# Patient Record
Sex: Male | Born: 1955
Health system: Southern US, Community
[De-identification: ages and names within clinical notes are randomized; demographics above are authoritative.]

## PROBLEM LIST (undated history)

## (undated) DIAGNOSIS — Z978 Presence of other specified devices: Secondary | ICD-10-CM

## (undated) DIAGNOSIS — I499 Cardiac arrhythmia, unspecified: Secondary | ICD-10-CM

## (undated) DIAGNOSIS — E785 Hyperlipidemia, unspecified: Secondary | ICD-10-CM

## (undated) DIAGNOSIS — G4733 Obstructive sleep apnea (adult) (pediatric): Secondary | ICD-10-CM

## (undated) DIAGNOSIS — F419 Anxiety disorder, unspecified: Secondary | ICD-10-CM

## (undated) DIAGNOSIS — Z973 Presence of spectacles and contact lenses: Secondary | ICD-10-CM

## (undated) DIAGNOSIS — I498 Other specified cardiac arrhythmias: Secondary | ICD-10-CM

## (undated) DIAGNOSIS — K5730 Diverticulosis of large intestine without perforation or abscess without bleeding: Secondary | ICD-10-CM

## (undated) DIAGNOSIS — I493 Ventricular premature depolarization: Secondary | ICD-10-CM

## (undated) DIAGNOSIS — H43393 Other vitreous opacities, bilateral: Secondary | ICD-10-CM

## (undated) DIAGNOSIS — N138 Other obstructive and reflux uropathy: Secondary | ICD-10-CM

## (undated) DIAGNOSIS — M479 Spondylosis, unspecified: Secondary | ICD-10-CM

## (undated) DIAGNOSIS — R7303 Prediabetes: Secondary | ICD-10-CM

## (undated) DIAGNOSIS — K5790 Diverticulosis of intestine, part unspecified, without perforation or abscess without bleeding: Secondary | ICD-10-CM

## (undated) DIAGNOSIS — F411 Generalized anxiety disorder: Secondary | ICD-10-CM

## (undated) DIAGNOSIS — Z87438 Personal history of other diseases of male genital organs: Secondary | ICD-10-CM

## (undated) DIAGNOSIS — R000 Tachycardia, unspecified: Secondary | ICD-10-CM

## (undated) DIAGNOSIS — R002 Palpitations: Secondary | ICD-10-CM

## (undated) DIAGNOSIS — G4730 Sleep apnea, unspecified: Secondary | ICD-10-CM

## (undated) DIAGNOSIS — R339 Retention of urine, unspecified: Secondary | ICD-10-CM

## (undated) DIAGNOSIS — Z720 Tobacco use: Secondary | ICD-10-CM

## (undated) DIAGNOSIS — I10 Essential (primary) hypertension: Secondary | ICD-10-CM

## (undated) DIAGNOSIS — R9430 Abnormal result of cardiovascular function study, unspecified: Secondary | ICD-10-CM

## (undated) DIAGNOSIS — R5383 Other fatigue: Secondary | ICD-10-CM

## (undated) DIAGNOSIS — E663 Overweight: Secondary | ICD-10-CM

## (undated) DIAGNOSIS — M1A00X0 Idiopathic chronic gout, unspecified site, without tophus (tophi): Secondary | ICD-10-CM

## (undated) DIAGNOSIS — N401 Enlarged prostate with lower urinary tract symptoms: Secondary | ICD-10-CM

## (undated) HISTORY — DX: Hyperlipidemia, unspecified: E78.5

## (undated) HISTORY — DX: Essential (primary) hypertension: I10

## (undated) HISTORY — DX: Tachycardia, unspecified: R00.0

## (undated) HISTORY — DX: Tobacco use: Z72.0

## (undated) HISTORY — PX: SHOULDER SURGERY: SHX246

## (undated) HISTORY — DX: Diverticulosis of intestine, part unspecified, without perforation or abscess without bleeding: K57.90

## (undated) HISTORY — PX: WISDOM TOOTH EXTRACTION: SHX21

## (undated) HISTORY — DX: Abnormal result of cardiovascular function study, unspecified: R94.30

## (undated) HISTORY — DX: Sleep apnea, unspecified: G47.30

## (undated) HISTORY — DX: Anxiety disorder, unspecified: F41.9

## (undated) HISTORY — DX: Palpitations: R00.2

## (undated) HISTORY — DX: Overweight: E66.3

## (undated) HISTORY — DX: Other fatigue: R53.83

---
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
## 2001-07-12 ENCOUNTER — Emergency Department (HOSPITAL_COMMUNITY): Admission: EM | Admit: 2001-07-12 | Discharge: 2001-07-12 | Payer: Self-pay | Admitting: *Deleted

## 2001-07-17 ENCOUNTER — Ambulatory Visit (HOSPITAL_COMMUNITY): Admission: RE | Admit: 2001-07-17 | Discharge: 2001-07-17 | Payer: Self-pay | Admitting: Family Medicine

## 2001-07-17 ENCOUNTER — Encounter: Payer: Self-pay | Admitting: Family Medicine

## 2001-11-10 ENCOUNTER — Encounter: Payer: Self-pay | Admitting: Internal Medicine

## 2001-11-10 ENCOUNTER — Ambulatory Visit (HOSPITAL_COMMUNITY): Admission: RE | Admit: 2001-11-10 | Discharge: 2001-11-10 | Payer: Self-pay | Admitting: Internal Medicine

## 2002-02-04 ENCOUNTER — Emergency Department (HOSPITAL_COMMUNITY): Admission: EM | Admit: 2002-02-04 | Discharge: 2002-02-04 | Payer: Self-pay | Admitting: Emergency Medicine

## 2002-05-02 ENCOUNTER — Emergency Department (HOSPITAL_COMMUNITY): Admission: EM | Admit: 2002-05-02 | Discharge: 2002-05-02 | Payer: Self-pay | Admitting: *Deleted

## 2002-05-02 ENCOUNTER — Encounter: Payer: Self-pay | Admitting: *Deleted

## 2002-11-25 ENCOUNTER — Encounter (HOSPITAL_COMMUNITY): Admission: RE | Admit: 2002-11-25 | Discharge: 2002-12-25 | Payer: Self-pay | Admitting: Rheumatology

## 2002-11-25 ENCOUNTER — Encounter: Payer: Self-pay | Admitting: Rheumatology

## 2003-01-27 ENCOUNTER — Encounter (HOSPITAL_COMMUNITY): Admission: RE | Admit: 2003-01-27 | Discharge: 2003-02-26 | Payer: Self-pay | Admitting: Rheumatology

## 2003-05-26 ENCOUNTER — Encounter (HOSPITAL_COMMUNITY): Admission: RE | Admit: 2003-05-26 | Discharge: 2003-06-09 | Payer: Self-pay | Admitting: Rheumatology

## 2003-07-27 ENCOUNTER — Ambulatory Visit (HOSPITAL_COMMUNITY): Admission: RE | Admit: 2003-07-27 | Discharge: 2003-07-27 | Payer: Self-pay | Admitting: Family Medicine

## 2003-10-24 ENCOUNTER — Ambulatory Visit (HOSPITAL_COMMUNITY): Admission: RE | Admit: 2003-10-24 | Discharge: 2003-10-24 | Payer: Self-pay | Admitting: Family Medicine

## 2004-10-22 ENCOUNTER — Emergency Department (HOSPITAL_COMMUNITY): Admission: EM | Admit: 2004-10-22 | Discharge: 2004-10-22 | Payer: Self-pay | Admitting: Emergency Medicine

## 2004-11-05 ENCOUNTER — Ambulatory Visit: Payer: Self-pay | Admitting: *Deleted

## 2004-11-19 ENCOUNTER — Encounter (HOSPITAL_COMMUNITY): Admission: RE | Admit: 2004-11-19 | Discharge: 2004-12-19 | Payer: Self-pay | Admitting: *Deleted

## 2004-11-19 ENCOUNTER — Ambulatory Visit: Payer: Self-pay | Admitting: *Deleted

## 2004-11-28 ENCOUNTER — Ambulatory Visit: Payer: Self-pay | Admitting: *Deleted

## 2005-09-10 ENCOUNTER — Emergency Department (HOSPITAL_COMMUNITY): Admission: EM | Admit: 2005-09-10 | Discharge: 2005-09-10 | Payer: Self-pay | Admitting: Emergency Medicine

## 2005-09-11 ENCOUNTER — Ambulatory Visit: Payer: Self-pay | Admitting: *Deleted

## 2005-10-02 ENCOUNTER — Ambulatory Visit: Payer: Self-pay | Admitting: *Deleted

## 2005-10-04 ENCOUNTER — Ambulatory Visit: Payer: Self-pay | Admitting: Pulmonary Disease

## 2006-01-23 ENCOUNTER — Ambulatory Visit: Payer: Self-pay | Admitting: Cardiology

## 2006-09-23 ENCOUNTER — Ambulatory Visit (HOSPITAL_COMMUNITY): Payer: Self-pay | Admitting: Psychiatry

## 2006-10-01 ENCOUNTER — Ambulatory Visit (HOSPITAL_COMMUNITY): Payer: Self-pay | Admitting: Psychiatry

## 2006-10-09 ENCOUNTER — Ambulatory Visit (HOSPITAL_COMMUNITY): Payer: Self-pay | Admitting: Psychiatry

## 2006-10-15 ENCOUNTER — Ambulatory Visit (HOSPITAL_COMMUNITY): Payer: Self-pay | Admitting: Psychiatry

## 2006-10-30 ENCOUNTER — Ambulatory Visit: Payer: Self-pay | Admitting: Cardiology

## 2006-11-20 ENCOUNTER — Ambulatory Visit (HOSPITAL_COMMUNITY): Payer: Self-pay | Admitting: Psychiatry

## 2006-12-10 ENCOUNTER — Emergency Department (HOSPITAL_COMMUNITY): Admission: EM | Admit: 2006-12-10 | Discharge: 2006-12-10 | Payer: Self-pay | Admitting: Emergency Medicine

## 2006-12-16 ENCOUNTER — Ambulatory Visit (HOSPITAL_COMMUNITY): Payer: Self-pay | Admitting: Psychiatry

## 2006-12-17 ENCOUNTER — Ambulatory Visit: Payer: Self-pay | Admitting: Internal Medicine

## 2006-12-19 ENCOUNTER — Ambulatory Visit: Payer: Self-pay | Admitting: Internal Medicine

## 2006-12-26 ENCOUNTER — Ambulatory Visit: Payer: Self-pay

## 2007-02-10 ENCOUNTER — Ambulatory Visit: Payer: Self-pay | Admitting: Internal Medicine

## 2007-03-30 ENCOUNTER — Ambulatory Visit: Payer: Self-pay | Admitting: Internal Medicine

## 2007-04-09 ENCOUNTER — Ambulatory Visit: Payer: Self-pay | Admitting: Cardiology

## 2007-04-09 LAB — CONVERTED CEMR LAB
CO2: 29 meq/L (ref 19–32)
GFR calc Af Amer: 103 mL/min
GFR calc non Af Amer: 85 mL/min
Glucose, Bld: 93 mg/dL (ref 70–99)
Potassium: 3.6 meq/L (ref 3.5–5.1)
Sodium: 138 meq/L (ref 135–145)

## 2007-12-29 ENCOUNTER — Emergency Department (HOSPITAL_COMMUNITY): Admission: EM | Admit: 2007-12-29 | Discharge: 2007-12-29 | Payer: Self-pay | Admitting: Emergency Medicine

## 2007-12-29 ENCOUNTER — Encounter: Payer: Self-pay | Admitting: Cardiology

## 2008-01-16 ENCOUNTER — Emergency Department (HOSPITAL_COMMUNITY): Admission: EM | Admit: 2008-01-16 | Discharge: 2008-01-16 | Payer: Self-pay | Admitting: Emergency Medicine

## 2008-01-26 ENCOUNTER — Ambulatory Visit: Payer: Self-pay | Admitting: Cardiology

## 2009-02-17 ENCOUNTER — Encounter: Payer: Self-pay | Admitting: Cardiology

## 2009-06-05 ENCOUNTER — Ambulatory Visit: Payer: Self-pay | Admitting: Cardiology

## 2009-06-14 ENCOUNTER — Telehealth (INDEPENDENT_AMBULATORY_CARE_PROVIDER_SITE_OTHER): Payer: Self-pay | Admitting: Physician Assistant

## 2009-06-15 ENCOUNTER — Telehealth: Payer: Self-pay | Admitting: Cardiology

## 2009-08-30 ENCOUNTER — Encounter: Payer: Self-pay | Admitting: Pulmonary Disease

## 2009-09-01 ENCOUNTER — Encounter: Payer: Self-pay | Admitting: Pulmonary Disease

## 2010-04-27 ENCOUNTER — Telehealth: Payer: Self-pay | Admitting: Cardiology

## 2010-09-29 ENCOUNTER — Encounter: Payer: Self-pay | Admitting: Family Medicine
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
## 2011-01-22 NOTE — Assessment & Plan Note (Signed)
[facility]                            CARDIOLOGY OFFICE NOTE   Craig [name], Craig [name]                       MRN:          [id number]  DATE:12/17/2006                            DOB:          February 20, 1956    CARDIOLOGIST:  [name], MD, [facility]   PRIMARY CARE PHYSICIANS:  Madelin Rear. Sherwood Gambler, MD  Patrica Duel, M.D.   HISTORY OF PRESENT ILLNESS:  Craig [name] is a 55 year old male patient  followed by Dr. Myrtis Ser with a history of hypertension as well as  palpitations and chest pain. He had an adenosine Myoview in 2006,  revealing an EF of 63% and no ischemia. He has worn some monitors in the  past. He also has a history of significant anxiety and depression and  Dr. Myrtis Ser has felt that this plays a major role in his situation. He  presents to the office today for followup. He went to the [facility]  Emergency Room on April 2, with tachycardia. He had been in the shower  and when he got out he felt somewhat flushed. Prior to this he had eaten  dinner and noted that he had some abdominal discomfort. He checked his  pulse and noted it was elevated and he decided to go to the emergency  room. In the ER, his heart rate was in the 130s. He did not feel any  palpitations, but just did notice his pulse running fast. He denied any  chest pain or shortness of breath. In the hospital bed, he noted to the  ER physician that he usually takes Toprol for this and his heart rate  slows down and he feels better and goes home. Within a couple of minutes  of receiving Toprol, he developed a brady arrhythmia with a significant  pause of 3 to 3.5 seconds. The patient notes that he had a syncopal  episode. When he came to, several staff members were around him trying  to revive him. He was somewhat frightened by this and is still concerned  about the decreased heart rate. He actually obtained the strips and  brought them to me for review today. He has not had any further  episodes  of syncope or near-syncope. He decreased his atenolol dose himself to  once daily dosing. He has felt a little bit fatigued since the day he  went to the emergency room, but otherwise feels better. Denies any chest  pain or shortness of breath. Denies any orthopnea or paroxysmal  nocturnal dyspnea. Denies any lower extremity edema. He does note that  the day he went to the ER he had several episodes of diarrhea. This  resolved within 24 hours. He denies nausea or vomiting.   CURRENT MEDICATIONS:  1. Xanax 0.5 mg four times a day.  2. Atenolol 25 mg daily.  3. Allopurinol 100 mg daily.  4. Amitriptyline 20 mg daily.  5. Colchicine p.r.n.   ALLERGIES:  No known drug allergies.   PHYSICAL EXAMINATION:  He is a well-nourished, well-developed male in no  acute distress. Blood pressure is 117/90, pulse 66, weight 249  pounds.  HEAD: Normocephalic, atraumatic.  EYES: PERRLA, sclerae are clear.  NECK: Without JVD.  CARDIAC: Normal S1, S2, regular rate and rhythm without murmur, clicks,  rubs or gallops.  LUNGS:  Are clear to auscultation bilaterally without wheezing, rhonchi  or rales.  ABDOMEN: Soft and nontender.  EXTREMITIES: Without edema. Calves are soft and nontender.  SKIN: Is warm and dry.  Carotids are without bruits bilaterally.  NEUROLOGIC: He is alert and oriented x3. Cranial nerves II-XII are  grossly intact.   Electrocardiogram reveals sinus rhythm with a heart rate of 66, no acute  changes. Electrocardiogram from [facility] on April 2, reveals  sinus tachycardia with a heart rate of 136. His P-wave morphology is  somewhat unusual in V1. I reviewed this with Dr. Gala Romney. We also  reviewed the strips. He had a significant pause. This measures out to be  about 3 seconds on one occasion and 3-1/2 seconds on another occasion.   IMPRESSION:  1. Syncope in the setting of significant bradycardia and 3 to 3.5      second pause.  2. Tachycardia.      a.      History of palpitations, controlled on beta-blocker therapy.  3. History of hypertension, controlled.  4. History of chest pain.      a.     Adenosine Myoview in 2006, with an ejection fraction of 63%,       no ischemia.  5. Questionable sleep apnea.  6. History of chronic headache.  7. History of chronic fatigue.  8. History of gout.  9. Anxiety/depression.   PLAN:  1. As noted above, I reviewed the electrocardiogram and strips with      Dr. Gala Romney. We are somewhat concerned about the pause that he      had during admission to [facility] Emergency Room. Upon      review of his records, he did have low-normal potassium at 3.5, BUN      of 14, creatinine of 0.87, magnesium of 1.6. Point of care markers      negative x2. We question whether or not he may have an atrial      tachycardia. I discussed this over the telephone with Dr. Graciela Husbands. He      notes that if he does have an atrial tachycardia with post      termination pauses that he would likely benefit from ablation      therapy. We discussed recommendations regarding driving. In the      short-term, we think it is best that the patient refrain from      driving until further evaluations are accomplished. We will have      the patient set up with a CardioNet event monitor as well as      referral to Dr. Graciela Husbands. Dr. Graciela Husbands will be able to review the strips      from the patient's visit to the emergency room later this week and      see him in the office for further evaluation. I discussed this with      the patient and he is willing to      proceed with the evaluation as stated and agrees to refrain from      driving at this point in time.      Tereso Newcomer, PA-C  Electronically Signed      Bevelyn Buckles. Bensimhon, MD  Electronically Signed   SW/MedQ  DD: 12/17/2006  DT: 12/17/2006  Job #: [id number]   cc:   Madelin Rear. Sherwood Gambler, MD  Patrica Duel, M.D.
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
## 2011-01-22 NOTE — Assessment & Plan Note (Signed)
[facility]                         ELECTROPHYSIOLOGY OFFICE NOTE   NAME:[name], [name]                       MRN:          [id number]  DATE:03/30/2007                            DOB:          [birth year]    Craig [name] is seen.  He has more palpitations.  He has a history of  hypertension and chest pain, is status post adenosine Myoview scanning  in 2006, demonstrating normal left ventricular function, no ischemia.  He had a syncopal episode in April, where he was seen at [facility]  emergency room, where his heart rate was noted to be fast.  He was given  Toprol and then developed a significant pause.  His CardioNet strips  were obtained, and I do not have them, although I made as a comment from  a note in February 10, 2007 from me that he had a Holter monitor  demonstrating RR lengthening, then the pause.   He has had recent problems with gout.  He has been taking his colchicine  for this.  Typically, this reveals a resolution, but in the wake of this  he has had now more problems with palpitations.   He also complains of right ankle swelling with this.   Palpitations have not been more characterized.  There was an event  recorder obtained, and I need to track down the results of that.  I made  two notes from yesterday, and that comments regarding atrial tachycardia  as well as PVCs.   He has tried beta blockers for this, and there is some concern that it  may aggravate his gout.   His other medications include Xanax and Amitriptyline.   EXAMINATION:  VITAL SIGNS:  His blood pressure is 120/72, his pulse is  75.  LUNGS:  Clear.  HEART:  Sounds were regular.  EXTREMITIES:  Without edema.   His electrocardiogram was normal.   IMPRESSION:  1. Palpitations, question mechanism.  2. History of syncope in the setting of pain.  3. Hypertension.  4. Anxiety/depression.  5. Gout.   Craig [name] is not doing that well.  We are going to try Verapamil  and  diltiazem in random order, to see if any of those will help his  palpitations.  I need to track down his monitor, to find out where it  was obtained, so we can review those data.     Duke Salvia, MD, [facility]  Electronically Signed    SCK/MedQ  DD: 03/31/2007  DT: 03/31/2007  Job #: [id number]

## 2011-01-22 NOTE — Assessment & Plan Note (Signed)
[facility]                          [place] CARDIOLOGY OFFICE NOTE   NAME:Cambre, PRITESH SOBECKI                       MRN:          [id number]  DATE:01/26/2008                            DOB:          [birth year]    HISTORY:  Mr. Krupka is here for cardiology followup.  He is actually  doing well.  He has had some palpitations in the past.  He says that  recently he decided to follow a better diet and a fit better life, and  he is actually doing well.  He has had some urinary symptoms.  He has  not had any significant chest pain.  He saw Dr. Larey Dresser.  There  was question of Uroxatral use.  He thought this might make his  palpitations worse.  Therefore, he is asking me if he can take the  medication.   ALLERGIES:  He does not tolerate NIACIN.   MEDICATIONS:  1. Xanax.  2. Elavil.  3. Atenolol.  4. Doxycycline.   OTHER MEDICAL PROBLEMS:  See the list below.   REVIEW OF SYSTEMS:  Negative.   PHYSICAL EXAMINATION:  VITAL SIGNS:  Blood pressure is 126/80.  Heart  rate is 71.  GENERAL:  The patient is oriented to person, time and place.  Affect is  normal.  HEENT:  Reveals no xanthelasma.  There is normal extraocular  motion.  NECK:  There are no carotid bruits.  There is no jugular venous  distention.  LUNGS:  Clear.  Respiratory effort is not labored.  CARDIAC:  Reveals S1-S2.  There are no clicks or significant murmurs.  ABDOMEN:  Soft.  EXTREMITIES:  He has no peripheral edema.   DIAGNOSTICS:  EKG reveals normal sinus rhythm.   PROBLEMS:  1. History of hypertension.  2. History of anxiety and depression, stable today.  3. History of gout.  4. History of chronic fatigue.  5. History of chronic headaches.  6. History of sleep apnea.  This has been evaluated by Dr. Craige Cotta.  7. Chest pain.  He had a Myoview in 2006 with no ischemia and normal      ejection fraction.  8. History of palpitations, stable.  9. Urinary symptoms followed by Dr.  Vonita Moss.   DISCUSSION:  Mr. Kreiser is stable.  I have not changed any of his  medications.  I will see him in 6 months.  I told him that I was very  comfortable that it was safe for him to try Uroxatral.     [name], MD, [facility]  Electronically Signed    JDK/MedQ  DD: 01/26/2008  DT: 01/26/2008  Job #: [id number]

## 2011-01-22 NOTE — Assessment & Plan Note (Signed)
[facility]                                 ON-CALL NOTE   BONNER, LARUE                         MRN:          [id number]  DATE:12/26/2007                            DOB:          [birth year]    PRIMARY CARDIOLOGIST:  [name], MD, [facility]   Received a page through the answering service twice for a Mr. Halford Decamp.  Note, both phone conversations were approximately 20 minutes in  length.  Mr. Schramm called twice for problems with blood pressure and  pulse.  I called him back promptly at [phone]; he stated he was a  patient of Dr. Myrtis Ser and that his heart has been racing.  He has a blood  pressure monitor at home that checks his heart rate also.  When I asked  him what his racing heart rate was, he stated it was 90; his normal  pulse was around 60.  He states he had just been sitting watching TV,  when his heart rate suddenly was pounding.  This was when he checked his  blood pressure as 130/78 with a heart rate of 90.  He states his pulse  was not irregular.  He tried to palpate his pulse.  He denied any  associated symptoms, including lightheadedness, dizziness, presyncope,  syncope or chest discomfort.  He states he had been at home and had not  been doing anything strenuous.  He had not consumed any caffeine or over-  the-counter stimulants.  He was very agitated that his heart rate was  90, when it was normally 60.  I spoke with him at length concerning the  elevated pulse.  I asked him about his medications.  He stated he was on  atenolol, but had stopped it for a length of time; but had been back on  it for at least a week.  I asked him to take an additional 12.5 mg of  his atenolol.  He stated he did not want to do that because in the past  it has made his heart rate drop too low.  I reviewed the choices with  him; he could either try another half of the atenolol or he could come  to the emergency room (preferably [facility], although  he states he  lives in [place]) and get evaluated.  He stated he was going to wait  a few minutes and just relax and see if his heart rate slowed down.  He  would call me back.   He then called me back approximately an hour later, to state that his  heart rate was back down to normal.  He did not take any Xanax or any of  his additional atenolol.  Once again, he went through the same scenario  with me, that his heart rate was 90 and that is too fast.  He was very  upset about this.  I explained to him that I could not find any cause  for it to be cardiac related; possibly it could be anxiety.  But, I  would let  Dr. Myrtis Ser know, and the patient could follow up with  Dr. Myrtis Ser for further recommendation; possibly a 21-day event monitor.  Mr. Mcclaine would need to present to the emergency room if he had any  further problems.      Dorian Pod, ACNP  Electronically Signed      Madolyn Frieze. Jens Som, MD, [facility]  Electronically Signed   MB/MedQ  DD: 12/28/2007  DT: 12/28/2007  Job #: [id number]   cc:   [name], MD, [facility]

## 2011-01-22 NOTE — Letter (Signed)
December 19, 2006    Craig Newcomer, PA-C  [address]  [address]  [place], [place] [address]   RE:  Craig, [name]  MRN:  [id number]  /  DOB:  [birth year]   Dear Craig [name]:   It was a pleasure to see Craig [name] at your request today.  He has a  longstanding history of debilitating neuropsychiatric disease with  chronic fatigue, depression, and anxiety.   He has longstanding history of tachy palpitations that take him to the  emergency room 3 or 4 times a year, at which time he is treated with  Toprol with resolution.  He is on chronic Atenolol, higher doses of  which had been precluded by concomitant gout.   The other day he was having a relatively normal evening.  He suddenly  felt not quite so well and his fatigue seemed to be worse.  He hung out  on the couch for a couple of hours, then got up and went to take a  shower.  Prior to this he felt a little bit chilly, and had a little bit  of diarrhea.  Prior to the shower he noticed that his face felt warm,  after the shower his face stayed warm.  He sat on the couch for about an  hour after the shower, felt in his chest a little lightness which  turns out to correlate in his mind with his racing heart.  He took his  pulse, found it to be racing, took an Atenolol, repeated one about a  half an hour later.  There was a little bit of concomitant nausea that  this point, he went to the emergency room.  Upon arrival in the  emergency room his heart rate was about 140 or so.  He was given a dose  of Toprol within a minute or so.  His heart rate slowed very suddenly.  There was loss of consciousness and immediate restoration of heart rate.  As best as I can tell this all occurred in the supine position.   He does use caffeine, he does not use stimulants. He does have some  orthostatic intolerance, but no shower intolerance.   He does not use cigarettes, alcohol, or recreational drugs.  He does  have some remote alcohol use.   PAST MEDICAL  HISTORY:  Notable primarily as above.  Also is notable for  obesity.   CURRENT MEDICATIONS:  1. Xanax 0.5.  2. Amitriptyline.  3. Allopurinol 100.  4. Atenolol 25 b.i.d.   HE HAS NO KNOWN DRUG ALLERGIES.   SOCIAL HISTORY:  He is disabled, he lives with his wife.   REVIEW OF SYSTEMS:  Broadly negative apart from what was described above  and is further outlined on the intake sheet from [facility] on April 2  and is no different.   EXAMINATION:  He is a middle-aged Caucasian male appearing his stated  age of 89.  His blood pressure is 121/81 with a pulse of 64.  His blood  pressures were essentially unchanged with standing, although his pulse  went from 64 to 60 with sitting and 75 at standing associated with some  slight dizziness.  HEENT:  Demonstrated no drifts of xanthoma.  NECK:  Veins were flat, the carotids were full bilaterally without  bruits.  BACK:  Without kyphosis, scoliosis.  LUNGS:  Clear.  HEART:  Sounds were regular.  EXTREMITIES:  Without edema.   Electrocardiogram dated on April 9 demonstrated sinus rhythm at  66 but  there was a 0.16/0.07/0.39.   Electrocardiogram from this tachycardia was notable for a P wave  morphology that was slightly different.  However, I found an  electrocardiogram from May of __________ that had a P wave morphology  and a slow heart rate, similar to the P wave morphology of the  tachycardia.   The review of the Holter demonstrates gradually RR lengthening and then  abrupt lengthening just moments before it slowed even further,  generating the pause and the syncope.  His longest pause was about 3  seconds and then there was another pause that may have been a little  longer than that.   IMPRESSION:  1. Tachycardia, probably neurally mediated, need to exclude an atrial      tachycardia.  2. Bradycardia, likely neurally mediated, need to rule out post      termination pause.  3. Chronic fatigue, anxiety/depression.   Craig [name]  has significant concerns about the spells, as is appropriate.  I am not quite sure what the trigger of it was, but I do think it was  neurally mediated.  It may have been that it was triggered by the  tachycardia in part.  Alternatively it might have been a post  termination pause related to the ending of a primary electrical issue.   To that end we will try to use an event recorder to catch the  transitions to help [place] clarify.  I will plan to sit down with him in  about 4 week's time to review this.  In the interim he is to continue  his current medications.   I have asked that when he meets with Dr. Jennelle Human that he consider the use  of alternative antidepressants, potentially fluoxetine which has been  shown to have some benefit in early mediated syncope and certainly  considering discontinuation of the vagolytic amitriptyline.    Sincerely,      Craig Salvia, MD, [facility]  Electronically Signed    SCK/MedQ  DD: 12/19/2006  DT: 12/19/2006  Job #: [id number]   CC:   Craig Staggers, MD  Craig Rear. Sherwood Gambler, MD
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
## 2011-01-22 NOTE — Assessment & Plan Note (Signed)
[facility]                            CARDIOLOGY OFFICE NOTE   NAME:[name], [name]                       MRN:          [id number]  DATE:04/09/2007                            DOB:          Feb 16, 1956    See the notes in the chart from Dr. Odessa Fleming evaluation.  Dr. Graciela Husbands has  recommended either Diltiazem or verapamil as a trial.  The patient read  about these and decided that he is worried he will develop heart failure  and so he has not tried them.  He has a multitude of symptoms.  He has  cut his atenolol down to 12.5 mg daily.  He feels awful with the  medicine, and he says that he has palpitations without it.  We will  switch him to a long-acting metoprolol to see if this helps.   PAST MEDICAL HISTORY:   ALLERGIES:  None known.   MEDICATIONS:  1. Colchicine.  2. Xanax.  3. Amitriptyline.  4. Allopurinol.  5. Atenolol 12.5 daily.   OTHER MEDICAL PROBLEMS:  See my prior notes.   REVIEW OF SYSTEMS:  He has multiple complaints.   PHYSICAL EXAMINATION:  VITAL SIGNS:  Blood pressure is 103/81 with a  pulse of 80.  GENERAL:  The patient is oriented to person, time, and place.  He is  depressed.  LUNGS:  Clear.  Respiratory effort is not labored.  CARDIAC:  Reveals an S1 with an S2.  He has no clicks or significant  murmurs.  He has no peripheral edema.   PROBLEM LIST:  On my prior note and reviewed.  He continues to have some  mild palpations.  See the workup by Dr. Graciela Husbands with a question of syncope  and a question of pauses.  He will need followup with Dr. Graciela Husbands.   In the meantime, we will change his atenolol to metoprolol extended  release 12.5 mg daily.  He would like to go back on Benicar as he felt  well with this, however, his blood pressure is low.  I do not want to  add any other medicines.     [name], MD, [facility]  Electronically Signed    JDK/MedQ  DD: 04/09/2007  DT: 04/09/2007  Job #: [id number]

## 2011-01-25 NOTE — Assessment & Plan Note (Signed)
[facility]                            CARDIOLOGY OFFICE NOTE   NAME:Craig [name], Craig [name]                       MRN:          [id number]  DATE:10/30/2006                            DOB:          1955-12-15    Mr. Mccleary is seen for cardiac evaluation today.  He has had significant  recent chest discomfort and pain in his left neck.  We know from prior  evaluation that we have no documented coronary disease.  He has not  undergone cardiac catheterization.  The patient has been under enormous  stress.  He worries a great deal about his daughter who has respiratory  difficulty.  He also has been having periods of forgetfulness.  He has  had a longterm psychiatrist in [place] who retired and he will  have a new physician soon in that regard.  He has seen Dr. Dorethea Clan in  [place] in the past and then asked that I see him after that.   Recently he had some chest discomfort.  He had a localized discomfort in  his left neck.  When he called, he was encouraged to come to the  emergency room.  He was very hesitant about that and waited until today.  He feels much better at this time.   PAST MEDICAL HISTORY:  ALLERGIES:  NO KNOWN DRUG ALLERGIES.   MEDICATIONS:  1. Xanax 0.5 q.i.d.  2. Amitriptyline 75 mg at night.  3. Atenolol 25 b.i.d.  4. Allopurinol 100 mg daily.   OTHER MEDICAL PROBLEMS:  See the list below.   REVIEW OF SYSTEMS:  He has significant fatigue.  He is worried about  short episodes of memory loss.  As mentioned, he has enormous stress.  Otherwise his review of systems is negative.   PHYSICAL EXAMINATION:  Weight is 253 pounds. Blood pressure is 134/84  with a pulse of 85.  The patient is oriented to person, time, and place.  His affect reveals that he seems to me to be quite depressed.  He knows  that he is feeling poorly psychologically and he will be following up  with his appropriate doctors.  HEENT:  Reveals no xanthelasma.  The  patient appears to be somewhat wall-  eyed.  He focuses better with his right eye.  There are no carotid bruits.  There is no jugular venous distention.  CARDIAC:  Reveals an S1 with an S2.  There are no clicks or significant  murmurs.  The patient is significantly overweight.  ABDOMEN:  Soft.  There are no masses or bruits.  He has no peripheral  edema.   EKG reveals no significant abnormality.   PROBLEMS:  1. History of hypertension.  He was off his medicine and now back on      it and his blood pressure is stable today.  2. History of significant anxiety and depression.  I believe that this      plays a major role in his current situation, and he is bothered by      the multiple stresses that he has.  3. History of gout.  4. History of chronic fatigue.  5. Chronic headache.  6. Question of sleep apnea which has been evaluated.  There is a      complete note in the chart from Dr. Craige Cotta concerning this.  I am not      sure that the patient has followed through.  7. Chest pain.  He has had this before.  Adenosine Myoview in 2006      revealed an ejection fraction of 63% and no ischemia.  8. History of palpitations. This is stable at this time.   The patient feels much better.  I doubt that his symptoms are cardiac in  origin.  I was in the room with him speaking with him at great length  about many of his issues.  I spent the time with him specifically to try  to offer him some reassurance.  However, it is clear that his ongoing  psychiatric help will be very important.  No further cardiac workup at  this time.     [name], MD, [facility]  Electronically Signed    JDK/MedQ  DD: 10/30/2006  DT: 10/30/2006  Job #: [id number]   cc:   Patrica Duel, M.D.

## 2011-01-25 NOTE — Procedures (Signed)
   NAME:  Craig [name], Craig [name]                          ACCOUNT NO.:  [id number]   MEDICAL RECORD NO.:  [id number]                   PATIENT TYPE:  MS   LOCATION:                                       FACILITY:  [facility]   PHYSICIAN:  Fredirick Maudlin, M.D.              DATE OF BIRTH:  February 16, 1956   DATE OF PROCEDURE:  DATE OF DISCHARGE:  02/04/2002                                EKG INTERPRETATION   DATE AND TIME:  4:05  on 02/04/02  The rhythm is sinus rhythm with a rate of 101.  There are small Q waves  inferiorly which may indicate a previous inferior myocardial infarction.  There is minimal ST elevation in the lateral chest leads which may be due to  injury.  Clinical correlation is suggested.   IMPRESSION:  Abnormal electrocardiogram.                                               Fredirick Maudlin, M.D.    ELH/MEDQ  D:  04/12/2002  T:  04/17/2002  Job:  [id number]

## 2011-01-25 NOTE — Consult Note (Signed)
   NAME:  Craig [name], Craig [name] NO.:  [id number]   MEDICAL RECORD NO.:  [id number]                   PATIENT TYPE:   LOCATION:                                       FACILITY:   PHYSICIAN:  Aundra Dubin, M.D.            DATE OF BIRTH:   DATE OF CONSULTATION:  02/03/2003  DATE OF DISCHARGE:                                   CONSULTATION   CHIEF COMPLAINT:  Gout.   HISTORY OF PRESENT ILLNESS:  [name] returned on Jan 27, 2003 because of  some achiness in his foot.  He had not had any gout attacks before then.  I  had him stop the allopurinol over this 1 week and take colchicine 0.6 mg  b.i.d.  He is better at this time.  He has been on the prednisone until this  ran out a few days ago. His weight is stable and he is down 1/2 pound.  Mr.  Craig [name] needed a great deal of time to listen to a social situation as he is  having difficulty with a neighbor.   MEDICINES:  1. Xanax 1 mg h.s.  2. Elavil 25 mg h.s.  3. Allopurinol usually 300 mg daily.  4. Colchicine 0.6 mg b.i.d.   PHYSICAL EXAMINATION:  VITAL SIGNS:  Weight 226 pounds.  Blood pressure  110/70, respirations 18.  EXTREMITIES:  The hands, wrists, elbows, shoulders, knees, ankles and feet  had no active swelling and were all cool.   ASSESSMENT AND PLAN:  1. Gout.  The plan is for him to return to allopurinol 200 mg daily.  He     will take colchicine 0.6 mg b.i.d. for 2 weeks and then lower to 1 daily     for 2 weeks; and then, if he is having no twinges he can stop the     colchicine.  I also advised him that he can restart the colchicine, at     any point, that he has twinges of the gout.  He is now off of prednisone.  2. Chronic fatigue.  3.     Situational anxiety.  4. He will return in 4 months.   TIME:  40 minutes with decision making on Jan 27, 2003 and today's office  visit lasting 35 minutes.                                               Aundra Dubin, M.D.    WWT/MEDQ   D:  02/03/2003  T:  02/03/2003  Job:  [id number]   cc:   Madelin Rear. Sherwood Gambler, M.D.  [address]  [place]  [place] [address]  Fax: [phone]

## 2011-01-25 NOTE — Consult Note (Signed)
Craig [name], Craig [name]                          ACCOUNT NO.:  [id number]   MEDICAL RECORD NO.:  [id number]                   PATIENT TYPE:  OUT   LOCATION:  RAD                                  FACILITY:  [facility]   PHYSICIAN:  Aundra Dubin, M.D.            DATE OF BIRTH:  12/25/1955   DATE OF CONSULTATION:  05/26/2003  DATE OF DISCHARGE:                                   CONSULTATION   CHIEF COMPLAINT:  Gout.   HISTORY OF PRESENT ILLNESS:  Craig [name] is a 55 year old man with gout.  He  has been taking a reduced dose of allopurinol either at 150 mg a day or  skipping some days.  However, he had a gout attack in the right big toe two  weeks ago.  He used colchicine, and this is completely asymptomatic today.  He has had some occasional headaches, but this is not a consistent everyday  process.  He has had some leg cramps, but this may be have happened in April  or May.  There has been no significant diarrhea, rashes, fever, nausea.  His  weight is up 9 pounds.   MEDICATIONS:  1. Allopurinol 300 mg a day or less.  2. Colchicine 0.6 mg p.r.n.  3. Elavil 25 mg q.h.s.  4. Xanax 1 mg q.h.s.   PHYSICAL EXAMINATION:  VITAL SIGNS:  Weight 235 pounds, blood pressure  140/94, respirations 16.  GENERAL:  No distress.  SKIN:  Clear.  LUNGS:  Clear.  HEART:  Regular.  No murmur.  EXTREMITIES:  Lower extremities with no edema.  MUSCULOSKELETAL:  The hands, wrists, elbows, and shoulders have a good range  of motion.  The hands have thickening, but this is not arthritis.  The knees  are cool and flex easily to 130 degrees.  The ankles and feet were  nonswollen and nontender.  In particular, the big toes were nontender.   ASSESSMENT AND PLAN:  1. Gout.  I have emphasized to Craig [name] to stay on the allopurinol 300 mg     once a day.  He is likely not to need colchicine very often.  I believe     this is a good clear diagnosis of gout at this point.  However, this is     not crystal  proven.  We will check a uric acid and CMET today.  2. Chronic fatigue.  3. Elevated blood pressure.  I have asked him to follow up with Dr. Sherwood Gambler     within one to two weeks concerning this.   Craig [name] gout is stable if he will stay on the allopurinol.  He has some  blood pressure issues that will be followed by Dr. Sherwood Gambler.  Craig [name] is  informed by me that my clinic is being terminated.  He will follow up with  all of his medical needs at this point with Dr. Sherwood Gambler.  Aundra Dubin, M.D.    WWT/MEDQ  D:  05/26/2003  T:  05/26/2003  Job:  [id number]   cc:   Madelin Rear. Sherwood Gambler, M.D.  [address]  [place]  [place] [address]  Fax: [phone]

## 2011-01-25 NOTE — Procedures (Signed)
   NAME:  KHARY, SCHABEN                          ACCOUNT NO.:  [id number]   MEDICAL RECORD NO.:  [id number]                   PATIENT TYPE:  MS   LOCATION:                                       FACILITY:  [facility]   PHYSICIAN:  Fredirick Maudlin, M.D.              DATE OF BIRTH:  [birth year]   DATE OF PROCEDURE:  DATE OF DISCHARGE:  02/04/2002                                EKG INTERPRETATION   DATE AND TIME:  7:26 on 02/04/02   INTERPRETATION:  The rhythm is sinus rhythm with a rate of about 100.  There  is minimal ST elevation in V4, V5, and V6.  This could indicate an injury  and clinical correlation is suggested.                                                Fredirick Maudlin, M.D.    ELH/MEDQ  D:  04/12/2002  T:  04/17/2002  Job:  [id number]

## 2011-01-25 NOTE — Procedures (Signed)
NAMEMORELL, MEARS NO.:  [id number]   MEDICAL RECORD NO.:  [id number]          PATIENT TYPE:  REC   LOCATION:  RAD                           FACILITY:  [facility]   PHYSICIAN:  Vida Roller, M.D.   DATE OF BIRTH:  08-30-1956   DATE OF PROCEDURE:  DATE OF DISCHARGE:                                    STRESS TEST   ADENOSINE MYOVIEW   HISTORY:  Mr. Jaros is a 55 year old male with no known coronary artery  disease with complaints of palpitations with exertion.  His cardiac risk  factors include hypertension.   Baseline data EKG reveals a sinus rhythm at 78 beats per minute with  nonspecific ST abnormalities.  Blood pressure is 138/68.   Forty-nine milligrams of adenosine was infused over a four-minute protocol  with Myoview injected at three minutes.  The patient reported a flushed  feeling, shortness of breath and abdominal and chest tightness which  resolved in recovery.  EKG revealed no arrhythmias and no ischemic changes.   Final images and results are pending MD review.      AB/MEDQ  D:  11/19/2004  T:  11/19/2004  Job:  [id number]

## 2011-01-25 NOTE — Group Therapy Note (Signed)
   NAME:  Craig [name], Craig [name]                          ACCOUNT NO.:  [id number]   MEDICAL RECORD NO.:  [id number]                   PATIENT TYPE:  EMS   LOCATION:  ED                                   FACILITY:  [facility]   PHYSICIAN:  Fredirick Maudlin, M.D.              DATE OF BIRTH:  [birth year]   DATE OF PROCEDURE:  DATE OF DISCHARGE:  05/02/2002                                   PROGRESS NOTE   ELECTROCARDIOGRAM:  (Time 0148 on May 02, 2002)  The rhythm is sinus rhythm with a rate in the 80's. There are small Q waves  inferiorly and there is minimal ST elevation inferiorly as well. Clinical  correlation is suggested. This could indicate an acute inferior myocardial  infarction. There are multiple extra systole's, most of which appear to be  PAC's.   INTERPRETATION:  Abnormal electrocardiogram.                                               Fredirick Maudlin, M.D.    ELH/MEDQ  D:  05/02/2002  T:  05/02/2002  Job:  [id number]

## 2011-01-25 NOTE — Procedures (Signed)
   NAME:  Craig [name], Craig [name]                          ACCOUNT NO.:  [id number]   MEDICAL RECORD NO.:  [id number]                   PATIENT TYPE:  MS   LOCATION:                                       FACILITY:  [facility]   PHYSICIAN:  Fredirick Maudlin, M.D.              DATE OF BIRTH:  04-Nov-1955   DATE OF PROCEDURE:  DATE OF DISCHARGE:  02/04/2002                                EKG INTERPRETATION   DATE AND TIME:  7:35 on 02/04/02   INTERPRETATION:  The rhythm is sinus rhythm with a rate in the 90s.  There  is ST elevation which is very minimal in limb leads and in chest leads 4, 5,  and 6.  This may indicate injury and correlation is suggested.  There are  also Q waves inferiorly which may indicated inferior myocardial infarction  and clinical correlation is again suggested.   IMPRESSION:  Abnormal electrocardiogram.                                               Fredirick Maudlin, M.D.    ELH/MEDQ  D:  04/12/2002  T:  04/17/2002  Job:  [id number]

## 2011-01-25 NOTE — Assessment & Plan Note (Signed)
[facility]                                 ON-CALL NOTE   NAME:Craig [name], Craig [name]                         MRN:          [id number]  DATE:09/26/2006                            DOB:          [birth year]    Primary cardiologist is Dr. Willa Rough.   CHIEF COMPLAINT:  Palpitations.   Mr. Rafferty is a 55 year old male patient of Dr. Myrtis Ser.  He has a history  of tachy palpitations and was seen in the emergency room on September 09, 2005, for these.  He has also been evaluated by Dr. Myrtis Ser.  At the time  he was in the emergency room his heart rate was 105.  There is no report  in any dictation of any significant arrhythmia.  According to the  patient he gets these and when he does get them he will feel his heart  race, and he stated that this happened tonight after he drank several  helpings of Pepsi with caffeine in it.  He stated that he came home and  took his atenolol, and now his heart rate is under 100 and he is feeling  much better.  The reason he called was that he is down to his last dose  of atenolol and does not have an appointment with Dr. Myrtis Ser as yet.  He  is requesting a refill on his atenolol until he can see Dr. Myrtis Ser.  I  discussed the situation with Mr. Kestler.  I advised him that he needed to  be followed by Dr. Myrtis Ser and he needed for Dr. Myrtis Ser to see him for any  further refills.  I did, however, agree to refill the medication once  and give him a month's worth at b.i.d. He stated Dr. Myrtis Ser had told him  to take it b.i.d., but the original prescription was only for daily, so  he was running out sooner than planned.  He stated that he would make an  appointment with Dr. Myrtis Ser to see him very soon.  He stated he was not  having any chest pain, shortness of breath or other symptoms, and did  not feel he needed further evaluation on an urgent basis.  A  prescription was called in to [facility] without incident.      Theodore Demark, PA-C  Electronically Signed      Noralyn Pick. Eden Emms, MD, [facility]  Electronically Signed   RB/MedQ  DD: 09/26/2006  DT: 09/27/2006  Job #: [id number]

## 2011-01-25 NOTE — Consult Note (Signed)
Craig [name], Craig [name] NO.:  [id number]   MEDICAL RECORD NO.:  [id number]                   PATIENT TYPE:   LOCATION:                                       FACILITY:   PHYSICIAN:  Aundra Dubin, M.D.            DATE OF BIRTH:   DATE OF CONSULTATION:  11/25/2002  DATE OF DISCHARGE:                                   CONSULTATION   CHIEF COMPLAINT:  Recurrent gout.   Dear Craig [name]:   Thank you for this consultation.  Craig [name] is a 55 year old white male with  a history of chronic fatigue since 1992, and has had twinges and attacks of  gout probably over the last six years.  In the last several months he has  had numerous attacks.  Primarily these involve the foot and ankle.  He has  had numerous occasions where over one day or over a night he would have a  gout attack in the big toe or foot area.  In early January he was seen  because of a gout attack to the left foot.  He is still aching in the  Achilles area and the right big toe.  He was given colchicine and took this  enough times in 24 hours to create diarrhea, but the gout resolved.  He was  only on this for about a week or so, and the gout returned.  He saw Dr.  Hilda [name], who placed him on allopurinol, and the dose was escalated each week  up to 400 mg daily.  Two days ago he some flaring to the right mid foot.  He  has also noticed some rash and what he calls pimples, which he feels are  associated to the allopurinol.  Laboratories show that on September 10, 2002, a  uric acid was 9, and on September 28, 2002, was 10.3.   He remains quite fatigued.  He says he has had chronic fatigue syndrome  since 1982.  He has never slept well during his adult life.  He snores  tremendously.  He says his wife sometimes has to move out of the room and  hears him stop breathing somewhat frequently.  His energy level is low.  He  has felt feverish, but there have been no fevers.  He has had some red rash  and  pimples to his face.  There has been no psoriasis, photosensitivity,  oral ulcers, alopecia, pleurisy.  He has headaches about twice a week which  are moderate in severity.  There was some diarrhea with the colchicine, but  there has been no blood or mucous to the bowel movement.  He has chest pain  he relates to stress involving his daughter.  There has been no shortness of  breath.   PAST MEDICAL/SURGICAL HISTORY:  1. Chronic fatigue syndrome.  2. Unknown PVC.  He is going to be evaluated by a cardiologist.  3. Gout.  4. Right shoulder dislocation in 1978.   MEDICATIONS:  1. Xanax 0.5 mg q.i.d.  2. Elavil 25/50 mg h.s.  3. Allopurinol 4000 mg daily.   DRUG INTOLERANCES:  None.   FAMILY HISTORY:  Mother and father are living, and they do not have  arthritis.  A grandfather had gout.   SOCIAL HISTORY:  He lived most of his life in [place].  He is married and  has a 17 year old daughter who has health problems involving her heart.  This is causing Craig [name] to have a great deal of stress.  He completed high  school and a few college courses.  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  He does not smoke or drink alcohol.   PHYSICAL EXAMINATION:  VITAL SIGNS:  Weight 227 pounds, height 5 feet 8  inches.  Blood pressure 118/72, respirations 14.  GENERAL:  Moderately overweight and is in no distress.  SKIN:  He has some redness to his face.  There is no rash to the legs or  abdomen to suggest a reaction to allopurinol.  There are a few pimples to  the upper back.  HEENT:  The left eye has some lateral deviation.  The extraocular muscles  were intact.  Mouth clear.  He has an ample-sized tongue which takes up a  great deal of the oral space.  NECK:  Negative JVD.  Nontender thyroid.  LUNGS:  Clear  HEART:  Regular.  With no murmur.  ABDOMEN:  Soft, obese, nontender.  MUSCULOSKELETAL:  The hands have a puffy appearance but are cool and  nontender to palpation.   Wrists, elbows, shoulders good range of motion with  mild stiffness.  Trigger points were nontender.  The neck had mild decreased  range of motion but no tenderness.  Low back, nontender.  Hips, good range  of motion.  The knees were cool and flexed without tenderness to 135  degrees.  The left Achilles area was warm and swollen.  The skin tones were  normal.  The left first toe was nontender.  The right first toe had mild  tenderness with fairly full flexion.  The first toe was not red or warm.  The right ankle was unremarkable.  NEUROLOGIC:  Strength was 5/5.  DTRs 2+ throughout.  Negative SLRs.   ASSESSMENT AND PLAN:  1. Gout:  This is not crystal-proven, but I believe the asymmetric nature     and rapid onset of the arthritic episodes are quite suggestive of gout.     Because of the possibility that he is having some difficulty with a rash,     I am having him continue on allopurinol 300 mg daily.  In addition, he     will take colchicine 0.1 mg daily and prednisone 5 mg daily.  I will only     use the prednisone for approximately two months.  He was advised the     prednisone can increase his appetite and be associated with weight gain.     He will snack on whole fruits.  He was also advised to cut down on his     red meat consumption.  He was advised to increase his consumption of low-     fat dairy products to include yogurt, skim milk, and ricotta cheese.  We     will check a CBC, CMET, and uric acid today.  2. Chronic fatigue.  3. Insomnia:  I suspect that  he has sleep apnea.  I offered to set up a     sleep study, but he feels that he has too much stress and will undergoing     a cardiology workup.  We will come back to discuss this again.   COMMENTS:  Craig [name], I suspect that Craig [name] does have gout although it is not  crystal-proven.  With a couple of months I am hopeful that he will be on only allopurinol.  Later in the year we will try and further work up the  possible  diagnosis of sleep apnea.  He will return in two months.                                               Aundra Dubin, M.D.    WWT/MEDQ  D:  11/25/2002  T:  11/25/2002  Job:  [id number]

## 2011-01-28 ENCOUNTER — Encounter: Payer: Self-pay | Admitting: Cardiology

## 2011-01-28 DIAGNOSIS — J45909 Unspecified asthma, uncomplicated: Secondary | ICD-10-CM | POA: Insufficient documentation

## 2011-01-28 DIAGNOSIS — K5790 Diverticulosis of intestine, part unspecified, without perforation or abscess without bleeding: Secondary | ICD-10-CM | POA: Insufficient documentation

## 2011-01-28 DIAGNOSIS — E663 Overweight: Secondary | ICD-10-CM | POA: Insufficient documentation

## 2011-01-28 DIAGNOSIS — M109 Gout, unspecified: Secondary | ICD-10-CM | POA: Insufficient documentation

## 2011-01-28 DIAGNOSIS — F419 Anxiety disorder, unspecified: Secondary | ICD-10-CM | POA: Insufficient documentation

## 2011-01-28 DIAGNOSIS — R5383 Other fatigue: Secondary | ICD-10-CM | POA: Insufficient documentation

## 2011-01-28 DIAGNOSIS — E119 Type 2 diabetes mellitus without complications: Secondary | ICD-10-CM | POA: Insufficient documentation

## 2011-01-28 DIAGNOSIS — G4730 Sleep apnea, unspecified: Secondary | ICD-10-CM | POA: Insufficient documentation

## 2011-01-28 DIAGNOSIS — Z720 Tobacco use: Secondary | ICD-10-CM | POA: Insufficient documentation

## 2011-01-30 ENCOUNTER — Encounter: Payer: Self-pay | Admitting: *Deleted

## 2011-01-30 ENCOUNTER — Telehealth: Payer: Self-pay | Admitting: *Deleted

## 2011-01-30 ENCOUNTER — Ambulatory Visit (INDEPENDENT_AMBULATORY_CARE_PROVIDER_SITE_OTHER): Payer: BC Managed Care – PPO | Admitting: Physician Assistant

## 2011-01-30 ENCOUNTER — Encounter: Payer: Self-pay | Admitting: Cardiology

## 2011-01-30 DIAGNOSIS — R079 Chest pain, unspecified: Secondary | ICD-10-CM

## 2011-01-30 DIAGNOSIS — R5383 Other fatigue: Secondary | ICD-10-CM

## 2011-01-30 DIAGNOSIS — Z79899 Other long term (current) drug therapy: Secondary | ICD-10-CM

## 2011-01-30 DIAGNOSIS — E782 Mixed hyperlipidemia: Secondary | ICD-10-CM

## 2011-01-30 DIAGNOSIS — R5381 Other malaise: Secondary | ICD-10-CM

## 2011-01-30 NOTE — Progress Notes (Signed)
Clinical Summary [name] is a 55 y.o.male, with no known history of CAD, and long-standing history of palpitations, previously evaluated by Dr. Sherryl Manges, with a reportedly negative 30 day event monitor in 2006. He was last seen here in the clinic, 5/10, by Dr. Rollene Rotunda for routine followup. No medication adjustments were made.  Patient presents with complaint of significant fatigue, which began approximately one week ago. He recalls awakening from a nap feeling markedly drained, with some associated anterior chest pressure. Of note, he denies any interim development of exertional angina pectoris, or DOE. He denies any exacerbation of his CP with exertion. With respect to his long-standing palpitations, he seems to suggest some increase in frequency over the last month, or so. Nevertheless, these occur at most 1-2 times a week, and are very brief in duration, with no significant associated symptoms.  He denies any recent blood work.   Allergies  Allergen Reactions  . Ciprofloxacin Anxiety    nervous    Current outpatient prescriptions:ALPRAZolam (XANAX) 0.5 MG tablet, Take one tab twice a day  & 1 1/2 tab every evening, Disp: , Rfl: ;  amitriptyline (ELAVIL) 25 MG tablet, Take 25 mg by mouth at bedtime.  , Disp: , Rfl: ;  atenolol (TENORMIN) 25 MG tablet, Take 25 mg by mouth daily.  , Disp: , Rfl: ;  allopurinol (ZYLOPRIM) 100 MG tablet, Take 100 mg by mouth daily.  , Disp: , Rfl:  colchicine 0.6 MG tablet, Take 0.6 mg by mouth daily. As needed for gout flare., Disp: , Rfl: ;  ibuprofen (ADVIL,MOTRIN) 200 MG tablet, Take 200 mg by mouth every 6 (six) hours as needed.  , Disp: , Rfl: ;  DISCONTD: amitriptyline (ELAVIL) 10 MG tablet, Take 10 mg by mouth at bedtime.  , Disp: , Rfl:   Past Medical History  Diagnosis Date  . Sleep apnea     Dr. Craige Cotta  . Ejection fraction     Normal ejection fraction, nuclear, 2006  . Gout   . Anxiety   . Palpitations   . Tobacco abuse   . Diabetes  mellitus   . Hypertension   . Chest pain     Nuclear, 2006, no ischemia, normal ejection fraction  . Asthma   . Diverticular disease   . Overweight   . Fatigue     Chronic    Social History Mr. Hilyard reports that he has quit smoking. He does not have any smokeless tobacco history on file. Mr. Berwick has no alcohol history on file.  Review of Systems The patient denies exertional CP, DOE, PND, orthopnea, or lower extremity edema. Has long-standing history of palpitations, with increased frequency over the past month, or so. Denies any associated CP, SOB, or presyncope.  Denies history of DM. Remaining systems review, and are negative.    Physical Examination Filed Vitals:   01/30/11 1324  BP: 119/72  Pulse: 63   General: Well-developed, well-nourished in no distress head: Normocephalic and atraumatic eyes PERRLA/EOMI intact, conjunctiva and lids normal nose: No deformity or lesions mouth normal dentition, normal posterior pharynx neck: Supple, no JVD.  No masses, thyromegaly or abnormal cervical nodes lungs: Normal breath sounds bilaterally without wheezing.  Normal percussion heart: regular rate and rhythm with normal S1 and S2, no S3 or S4.  PMI is normal.  No pathological murmurs abdomen: Protuberant, Normal bowel sounds, abdomen is soft and nontender musculoskeletal: Back normal, normal gait muscle strength and tone normal pulsus: Pulse is normal in  all 4 extremities Extremities: No peripheral pitting edema neurologic: Alert and oriented x 3 skin: Intact without lesions or rashes cervical nodes: No significant adenopathy psychologic: Normal affect   ECG Normal sinus rhythm 61 bpm; normal axis; no ischemic changes   Studies   Problem List and Plan

## 2011-01-30 NOTE — Assessment & Plan Note (Signed)
We'll attempt to rule out possible cardiovascular etiology. Further evaluation to be deferred to his primary care physician.

## 2011-01-30 NOTE — Telephone Encounter (Signed)
Checking percert for Echocardiogram stress test and 2 D ECHO Set for June 11th @ [facility]

## 2011-01-30 NOTE — Assessment & Plan Note (Signed)
Will order a baseline echocardiogram. No current plans for reassessment with an event monitor, previously reportedly negative in 2008. Continue suppression of palpitations with current low dose atenolol. Will check metabolic profile and TSH level.

## 2011-01-30 NOTE — Patient Instructions (Addendum)
Follow up as scheduled. Your physician recommends that you continue on your current medications as directed. Please refer to the Current Medication list given to you today. Your physician recommends that you go to the [facility] for lab work: BMET/TSH/FLP-Do not eat or drink after midnight.  Your physician has requested that you have an echocardiogram. Echocardiography is a painless test that uses sound waves to create images of your heart. It provides your doctor with information about the size and shape of your heart and how well your heart's chambers and valves are working. This procedure takes approximately one hour. There are no restrictions for this procedure. Your physician has requested that you have a stress echocardiogram. For further information please visit [email or web address] Please follow instruction sheet as given. If the results of your test are normal or stable, you will receive a letter. If they are abnormal, the nurse will contact you by phone.

## 2011-01-30 NOTE — Assessment & Plan Note (Addendum)
We'll evaluate further with an exercise stress echocardiogram for risk stratification. If this is negative, then no further workup is indicated. Patient had a normal adenosine Cardiolite; EF 63%, in 2006 ([facility]). Will hold atenolol, pre-test.

## 2011-02-06 NOTE — Telephone Encounter (Signed)
STRESS ECHO @ [facility] AUTH # [id number] EXPIRES 03/07/11   2D ECHO            @ [facility] AUTH # [id number] EXPIRES 03/07/11  ([employer])

## 2011-02-11 ENCOUNTER — Telehealth: Payer: Self-pay | Admitting: Physician Assistant

## 2011-02-11 NOTE — Telephone Encounter (Signed)
Pt recently started prednisone taper (12 days) for a gout flare and is noticing an increase in his heart rate and PVCs, wondering if the two can be associated. I informed him that palpitations are a common side effect. He took his AM dose earlier this morning and has one more tablet due tonight. Advised him to skip tonight's dose of prednisone, cut tomorrow's dose in half and see how he feels. Advised him to f/u with PCP for alternative agent if he continues to have problems, and f/u with Dr. Myrtis Ser if his PVCs remain more frequent/symptomatic.

## 2011-02-14 ENCOUNTER — Telehealth: Payer: Self-pay | Admitting: Physician Assistant

## 2011-02-14 NOTE — Telephone Encounter (Signed)
Craig [name] called and states that he wants to cancel his stress test, echo and labs at this time. States he is going Back to Dr. Sherwood Gambler for more testing with him. Thinks he might be a diabetic. He is going to have Dr. Sherwood Gambler do his Lab work.

## 2011-02-14 NOTE — Telephone Encounter (Signed)
Noted  

## 2011-02-15 NOTE — Telephone Encounter (Signed)
thanks

## 2011-02-25 ENCOUNTER — Ambulatory Visit: Payer: BC Managed Care – PPO | Admitting: Cardiology

## 2011-02-28 DIAGNOSIS — R072 Precordial pain: Secondary | ICD-10-CM

## 2011-03-26 ENCOUNTER — Telehealth: Payer: Self-pay | Admitting: Cardiology

## 2011-03-26 NOTE — Telephone Encounter (Addendum)
ROI faxed over to [facility] @ [phone]  03/26/11/km  Records received from [facility] gave to [facility]  03/26/11/km

## 2011-06-04 LAB — BASIC METABOLIC PANEL
Calcium: 10.2
Creatinine, Ser: 1.03
GFR calc Af Amer: >60
Potassium: 4.5
Sodium: 137

## 2011-06-04 LAB — URINALYSIS, ROUTINE W REFLEX MICROSCOPIC
Protein, ur: NEGATIVE
Specific Gravity, Urine: <1.005 — ABNORMAL LOW

## 2011-06-04 LAB — CBC
HCT: 48
MCV: 92.7
Platelets: 255
RBC: 5.18
WBC: 9.1

## 2011-06-04 LAB — DIFFERENTIAL
Eosinophils Absolute: 0.1
Eosinophils Relative: 1
Lymphocytes Relative: 21
Lymphs Abs: 1.9
Monocytes Relative: 7
Neutrophils Relative %: 70

## 2011-09-22 ENCOUNTER — Encounter (HOSPITAL_COMMUNITY): Payer: Self-pay | Admitting: *Deleted

## 2011-09-22 ENCOUNTER — Telehealth: Payer: Self-pay | Admitting: Adult Health

## 2011-09-22 ENCOUNTER — Other Ambulatory Visit: Payer: Self-pay

## 2011-09-22 ENCOUNTER — Emergency Department (HOSPITAL_COMMUNITY)
Admission: EM | Admit: 2011-09-22 | Discharge: 2011-09-22 | Disposition: A | Discharge status: Home / Self Care | Payer: 59 | Attending: Emergency Medicine | Admitting: Emergency Medicine

## 2011-09-22 DIAGNOSIS — R197 Diarrhea, unspecified: Secondary | ICD-10-CM | POA: Insufficient documentation

## 2011-09-22 DIAGNOSIS — M109 Gout, unspecified: Secondary | ICD-10-CM | POA: Insufficient documentation

## 2011-09-22 DIAGNOSIS — E119 Type 2 diabetes mellitus without complications: Secondary | ICD-10-CM | POA: Insufficient documentation

## 2011-09-22 DIAGNOSIS — Z87891 Personal history of nicotine dependence: Secondary | ICD-10-CM | POA: Insufficient documentation

## 2011-09-22 DIAGNOSIS — J45909 Unspecified asthma, uncomplicated: Secondary | ICD-10-CM | POA: Insufficient documentation

## 2011-09-22 DIAGNOSIS — G4730 Sleep apnea, unspecified: Secondary | ICD-10-CM | POA: Insufficient documentation

## 2011-09-22 DIAGNOSIS — R000 Tachycardia, unspecified: Secondary | ICD-10-CM | POA: Insufficient documentation

## 2011-09-22 DIAGNOSIS — R5383 Other fatigue: Secondary | ICD-10-CM | POA: Insufficient documentation

## 2011-09-22 DIAGNOSIS — K5730 Diverticulosis of large intestine without perforation or abscess without bleeding: Secondary | ICD-10-CM | POA: Insufficient documentation

## 2011-09-22 DIAGNOSIS — R5381 Other malaise: Secondary | ICD-10-CM | POA: Insufficient documentation

## 2011-09-22 LAB — CBC
HCT: 46.5 % (ref 39.0–52.0)
Hemoglobin: 16.6 g/dL (ref 13.0–17.0)
MCHC: 35.7 g/dL (ref 30.0–36.0)
RDW: 12.6 % (ref 11.5–15.5)
WBC: 16.2 10*3/uL — ABNORMAL HIGH (ref 4.0–10.5)

## 2011-09-22 LAB — DIFFERENTIAL
Basophils Relative: 0 % (ref 0–1)
Eosinophils Absolute: 0 10*3/uL (ref 0.0–0.7)
Lymphocytes Relative: 3 % — ABNORMAL LOW (ref 12–46)
Monocytes Relative: 3 % (ref 3–12)
Neutrophils Relative %: 95 % — ABNORMAL HIGH (ref 43–77)

## 2011-09-22 LAB — BASIC METABOLIC PANEL
Creatinine, Ser: 0.75 mg/dL (ref 0.50–1.35)
GFR calc Af Amer: >90 mL/min (ref >90)
Potassium: 3.9 mEq/L (ref 3.5–5.1)

## 2011-09-22 MED ORDER — SODIUM CHLORIDE 0.9 % IV BOLUS (SEPSIS)
500.0000 mL | Freq: Once | INTRAVENOUS | Status: AC
  Administered 2011-09-22: 500 mL via INTRAVENOUS

## 2011-09-22 MED ORDER — SODIUM CHLORIDE 0.9 % IV BOLUS (SEPSIS)
1000.0000 mL | Freq: Once | INTRAVENOUS | Status: AC
  Administered 2011-09-22: 1000 mL via INTRAVENOUS

## 2011-09-22 NOTE — ED Notes (Signed)
Pt states heart is racing, began last night with diarrhea and "feeling clammy".  Pt denies SOB, chest pain or fever.

## 2011-09-22 NOTE — Telephone Encounter (Signed)
Craig [name] called stating that his HR was up to 120 bpm.  He has been up with diarrhea most of the night.  He states that he took two tablets of atenolol 25 mg, one each about 1 hour a part. He continues to have HR in the 110-120.  He is asking for advise.  I advised him to be seen in [facility] ER to evaluate HR. He also may be dehydrated from diarrhea overnight, causing elevation in HR. He did not want to go to ER, but was willing to take in fluids.  If his HR remains elevated with fluids, I have asked him to proceed to [facility] ER for evaluation of HR and labs, with possible hydration. Not to take additional atenolol.  He verbalized understanding.

## 2011-09-22 NOTE — Telephone Encounter (Signed)
Mr. Dia has called back for the second time today because his HR has been elevated. He was seen in the ER after speaking to me earlier in the day and was found to be hypotensive with BP of 107/52. He was given IV fluids and rested. At the time of the ER evaluation, HR was documented at 124 bpm.  Since arriving home, he continues to check his HR. He states that it is 110-112 bpm.  He took an additional atenolol for a total of 75 mg for the day. He has also taken an additional Xanax .5mg  . He is concerned that his HR is still up.  I have advised him not to take anymore atenolol. He is to rest and keep his feet up. He is to continue to take in po fluids. I will have our office call him for follow-up in the am.

## 2011-09-22 NOTE — ED Provider Notes (Signed)
This chart was scribed for Joya Gaskins, MD by Williemae Natter. The patient was seen in room APA14/APA14 at 9:23 AM .  CSN: [id number]  Arrival date & time 09/22/11  4098   First MD Initiated Contact with Patient 09/22/11 0901      Chief Complaint  Patient presents with  . Tachycardia  . Nausea  . Diarrhea     Patient is a 56 y.o. male presenting with diarrhea. The history is provided by the patient.  Diarrhea The primary symptoms include diarrhea. Primary symptoms do not include fever, fatigue, nausea or vomiting. The illness began yesterday. The onset was sudden. The problem has not changed since onset. nothing improves his symptoms  Pt has had 8 episodes of diarrhea since midnight. Has a history of tachycardia, PVC's and high cholesterol.  No syncope No vomiting No CP No bloody stools No back pain Mild palpitations reported Takes  Atenolol for his tachycardia  Past Medical History  Diagnosis Date  . Sleep apnea     Dr. Craige Cotta  . Ejection fraction     Normal ejection fraction, nuclear, 2006  . Gout   . Anxiety   . Palpitations   . Tobacco abuse   . Diabetes mellitus   . Chest pain     Nuclear, 2006, no ischemia, normal ejection fraction  . Asthma   . Diverticular disease   . Overweight   . Fatigue     Chronic    History reviewed. No pertinent past surgical history.  No family history on file.  History  Substance Use Topics  . Smoking status: Former Games developer  . Smokeless tobacco: Not on file   Comment: Only smoked x 1 year in high school.  . Alcohol Use: Yes     occasional      Review of Systems  Constitutional: Negative for fever and fatigue.  Gastrointestinal: Positive for diarrhea. Negative for nausea and vomiting.   10 Systems reviewed and are negative for acute change except as noted in the HPI.  Allergies  Toprol xl and Ciprofloxacin  Home Medications   Current Outpatient Rx  Name Route Sig Dispense Refill  . ALLOPURINOL 100 MG PO  TABS Oral Take 100 mg by mouth daily.      Marland Kitchen ALPRAZOLAM 0.5 MG PO TABS  Take one tab twice a day  & 1 1/2 tab every evening    . AMITRIPTYLINE HCL 25 MG PO TABS Oral Take 25 mg by mouth at bedtime.      . ATENOLOL 25 MG PO TABS Oral Take 25 mg by mouth daily.      . COLCHICINE 0.6 MG PO TABS Oral Take 0.6 mg by mouth daily. As needed for gout flare.    . IBUPROFEN 200 MG PO TABS Oral Take 200 mg by mouth every 6 (six) hours as needed.        BP 124/65  Pulse 124  Temp(Src) 98.2 F (36.8 C) (Oral)  Resp 22  Ht 5\' 9"  (1.753 m)  Wt 265 lb (120.203 kg)  BMI 39.13 kg/m2  SpO2 96%  Physical Exam  Nursing note and vitals reviewed.  CONSTITUTIONAL: Well developed/well nourished HEAD AND FACE: Normocephalic/atraumatic EYES: EOMI/PERRL ENMT: Mucous membranes moist NECK: supple no meningeal signs SPINE:entire spine nontender CV:  Tachycardia, S1/S2 noted, no murmurs/rubs/gallops noted LUNGS: Lungs are clear to auscultation bilaterally, no apparent distress ABDOMEN: soft, nontender, no rebound or guarding GU:no cva tenderness NEURO: Pt is awake/alert, moves all extremitiesx4 EXTREMITIES: pulses normal, full  ROM SKIN: warm, color normal PSYCH: no abnormalities of mood noted  ED Course  Procedures    Pt improved, HR improved BP 107/65  Pulse 107  Temp(Src) 98.3 F (36.8 C) (Oral)  Resp 20  Ht 5\' 9"  (1.753 m)  Wt 265 lb (120.203 kg)  BMI 39.13 kg/m2  SpO2 97% Stable for d/c Advised to stay hydrated Pt agreeable   MDM  Nursing notes reviewed and considered in documentation All labs/vitals reviewed and considered Previous records reviewed and considered    I personally performed the services described in this documentation, which was scribed in my presence. The recorded information has been reviewed and considered.        Date: 09/22/2011  Rate: 121  Rhythm: sinus tachycardia  QRS Axis: normal  Intervals: normal  ST/T Wave abnormalities: normal  Conduction  Disutrbances:none     Joya Gaskins, MD 09/22/11 1204

## 2011-09-23 ENCOUNTER — Telehealth: Payer: Self-pay | Admitting: *Deleted

## 2011-09-23 NOTE — Telephone Encounter (Addendum)
Pt returned call. He states he has finally got his HR around 80-84. He was feeling pretty bad yesterday. He thinks he had some type of stomach bug and had dehydrated.   Pt states the ER doctor told him to take an additional atenolol. He states several times that he believes the person he s/w over the weekend thought he was dosing himself. He wants to be clear that he is not dosing himself and does not do this. He stated several times that the ER doctor told him he could take an additional atenolol if needed.   Pt will come for routine f/u with Dr. Myrtis Ser as he was due last June.

## 2011-09-23 NOTE — Telephone Encounter (Signed)
Joni Reining, NP left message stating she had received 2 calls from pt while on call this past weekend concerning increased HR and diarrhea. Pt was told by Samara Deist to increase his fluid intake. She states he continues to take extra doses of Atenolol. He is supposed to take 25 mg but is not up to 75 mg. He went to the ER and received IV fluids for mild dehydration before being sent home. He then called back to [facility] c/o elevated HR and weakness. She instructed to pt to take it easy over the weekend and someone from the office would contact him on Monday.

## 2011-09-23 NOTE — Telephone Encounter (Signed)
Contacted pt. He asked me to hold while he "clicked over to the other line." Unable to hold any longer. Call disconnected.

## 2011-10-15 ENCOUNTER — Telehealth: Payer: Self-pay | Admitting: Cardiology

## 2011-10-15 ENCOUNTER — Encounter: Payer: Self-pay | Admitting: Cardiology

## 2011-10-15 DIAGNOSIS — IMO0002 Reserved for concepts with insufficient information to code with codable children: Secondary | ICD-10-CM | POA: Insufficient documentation

## 2011-10-15 DIAGNOSIS — R002 Palpitations: Secondary | ICD-10-CM | POA: Insufficient documentation

## 2011-10-15 DIAGNOSIS — R9430 Abnormal result of cardiovascular function study, unspecified: Secondary | ICD-10-CM | POA: Insufficient documentation

## 2011-10-15 DIAGNOSIS — R079 Chest pain, unspecified: Secondary | ICD-10-CM | POA: Insufficient documentation

## 2011-10-15 NOTE — Telephone Encounter (Signed)
Spoke with pt, he has been trying to get a referral from dr fusco's office for his up coming appt and has not been able to. Spoke with janice at dr fusco's office, they faxed the referral yesterday and will refax today. Pt made aware.

## 2011-10-15 NOTE — Telephone Encounter (Signed)
New Problem  Patient called regarding 10-17-11 appnt. Patient insurance has recently changed and he will require a referral to see Dr. Myrtis Ser (specialist) for this appnt.  Patient has called several times and has noted he will cancel if he does no obtain the referral needed for this visit, with concerns of full patient responsibility without the proper documentation.  Patient reached at hm#

## 2011-10-17 ENCOUNTER — Encounter: Payer: Self-pay | Admitting: Cardiology

## 2011-10-17 ENCOUNTER — Ambulatory Visit: Payer: BC Managed Care – PPO | Admitting: Cardiology

## 2011-10-17 ENCOUNTER — Ambulatory Visit (INDEPENDENT_AMBULATORY_CARE_PROVIDER_SITE_OTHER): Payer: 59 | Admitting: Cardiology

## 2011-10-17 DIAGNOSIS — R079 Chest pain, unspecified: Secondary | ICD-10-CM

## 2011-10-17 DIAGNOSIS — R000 Tachycardia, unspecified: Secondary | ICD-10-CM

## 2011-10-17 DIAGNOSIS — G4730 Sleep apnea, unspecified: Secondary | ICD-10-CM

## 2011-10-17 NOTE — Assessment & Plan Note (Signed)
The patient has had some chest discomfort. He had a stress echo that was normal in June, 2012. He had no diagnostic abnormalities when he was in the emergency room September 22, 2011. I feel he does not need any further tests today. I will see him for early followup. He wants to be seen in [place] office.

## 2011-10-17 NOTE — Progress Notes (Signed)
HPI Patient is seen today to followup chest discomfort. I am also seeing him after an [facility]. On that day he felt an increased heart rate. It persisted and he went to the emergency room. I have reviewed emergency room records from that visit and the EKGs. He had sinus tachycardia improved. No further workup was needed.  He does have some vague chest pain as he has had in the past. There is been no marked recent change. He had undergone a stress echo in June, 2012. This showed normal LV function and no ischemia. Allergies  Allergen Reactions  . Toprol Xl (Metoprolol Succinate) Anaphylaxis    Pt states cardiac arrest occurred when given. Dr Lars Mage as the EDP.  Marland Kitchen Wheat Other (See Comments)    Was told according to a skin test   . Ciprofloxacin Anxiety    nervous  . Zithromax (Azithromycin Dihydrate) Anxiety    Allergy if on long periods at a time    Current Outpatient Prescriptions  Medication Sig Dispense Refill  . allopurinol (ZYLOPRIM) 100 MG tablet Take 100 mg by mouth 2 (two) times daily.       Marland Kitchen ALPRAZolam (XANAX) 0.5 MG tablet Take by mouth. Take one tablet twice a day, takes 2 tablets at bedtime.      Marland Kitchen amitriptyline (ELAVIL) 25 MG tablet Take 25 mg by mouth at bedtime.        . Ascorbic Acid (VITAMIN C) 1000 MG tablet Take 1,000 mg by mouth daily.      Marland Kitchen atenolol (TENORMIN) 25 MG tablet Take 25 mg by mouth daily.        . B Complex Vitamins (B COMPLEX 100) tablet Take 1 tablet by mouth daily.      . colchicine 0.6 MG tablet Take 0.6 mg by mouth daily. As needed for gout flare.      Marland Kitchen ibuprofen (ADVIL,MOTRIN) 200 MG tablet Take by mouth as needed. Headache/Pain        History   Social History  . Marital Status: Married    Spouse Name: N/A    Number of Children: N/A  . Years of Education: N/A   Occupational History  . Not on file.   Social History Main Topics  . Smoking status: Former Games developer  . Smokeless tobacco: Not  on file   Comment: Only smoked x 1 year in high school.  . Alcohol Use: Yes     occasional  . Drug Use: No  . Sexually Active: Yes   Other Topics Concern  . Not on file   Social History Narrative  . No narrative on file    No family history on file.  Past Medical History  Diagnosis Date  . Sleep apnea     Dr. Craige Cotta  . Ejection fraction     EF 60%, echo, June, 2012  . Gout   . Anxiety   . Palpitations     30 day event recorder, 2006, negative  . Tobacco abuse   . Diabetes mellitus   . Chest pain     Nuclear, 2006, normal,  /   stress echo, June, 2012, normal, EF 60%  . Asthma   . Diverticular disease   . Overweight   . Fatigue     Chronic    No past surgical history on file.  ROS  Patient denies fever, chills, headache, sweats, rash, change in vision, change in hearing, cough, nausea vomiting, urinary  symptoms. All other systems are reviewed and are negative.  PHYSICAL EXAM Patient is oriented to person time and place. Affect is normal. He is overweight. Head is atraumatic. There is no xanthelasma. There no carotid bruits. Lungs are clear. Respiratory effort is nonlabored. Cardiac exam reveals S1 and S2. There are no clicks or significant murmurs. The abdomen is soft but protuberant. There is no peripheral edema. There are no musculoskeletal deformities. There are no skin rashes. Filed Vitals:   10/17/11 1007  BP: 105/71  Pulse: 68  Height: 5\' 8"  (1.727 m)  Weight: 265 lb (120.203 kg)    EKG EKG is not done today. I have reviewed the EKG from September 22, 2011 in [facility]. There was sinus tachycardia. There was no change in the QRS.  ASSESSMENT & PLAN

## 2011-10-17 NOTE — Assessment & Plan Note (Signed)
The patient had some increased heart rate in September 22, 2011. He thinks that he may have had some food poisoning. This resolved and he is now stable. His heart rate is controlled. No change in therapy.

## 2011-10-17 NOTE — Assessment & Plan Note (Signed)
The patient has sleep apnea. At some point he may need further followup.

## 2011-10-17 NOTE — Patient Instructions (Signed)
Continue all current medications.  Follow up in  3 months at [facility]. Office - see above

## 2011-12-02 ENCOUNTER — Telehealth: Payer: Self-pay | Admitting: Cardiology

## 2011-12-02 NOTE — Telephone Encounter (Signed)
Craig [name] is concerned about experiencing what he describes as  pvc type feelings x 5 days at rest and in the shower.  He feels like a pause between heartbeats.  He states they are different from his previous pvcs.  He states they feel "drawn out".  He also states he feels fatigued, weak (last night) and maybe a little sob.  As soon as he doses off, he feels the pauses.  He states he has had some pressure in the center of his chest (most of the day) (3-4 on a scale of 10), mostly when getting up in the am relieved by time.  He states he hasn't had any yet today but before today has had approximately 10 episodes with 4-6 pauses in a row.   He states he feels better today.  He takes Atenolol 25mg  daily.  He states his insurance requires a referral to a specialist which he has for Dr Myrtis Ser and isn't sure if he can see a NP or PA with his insurance.  Craig Erney states he doesn't feel he needs to go to the emergency room and feels he needs some testing done.  He states he has gone to the local er in the past but states they do not have a cardiologist there to see him (based on past experiences).

## 2011-12-02 NOTE — Telephone Encounter (Signed)
New Msg: Pt calling wanting to speak with nurse/MD about possibly seeing MD sooner. Pt has appt to see MD on 12/10/11. Pt was told that Md would not be in the office until 12/10/11 however, pt wanted to know if nurse advised that pt see PA. Please return pt call to discuss further.

## 2011-12-02 NOTE — Telephone Encounter (Signed)
Pt to have a 48 hour holter monitor per Dr Clifton James.  He was told to go to the ER if increased symptoms.  He was scheduled for the 48 hour holter monitor tomorrow, 12/03/2011 at 12:00pm.  He is agreeable with this plan.

## 2011-12-02 NOTE — Telephone Encounter (Incomplete)
Craig [name] is concerned about experiencing what he describes as  pvc type feelings x 5 days at rest and in the shower.  He feels like a pause between heartbeats.  He states they are different from his previous pvcs.  He states they feel "drawn out".  He also states he feels fatigued, weak (last night) and maybe a little sob.  As soon as he doses off, he feels the pauses.  He states he has had some pressure in the center of his chest (most of the day) (3-4 on a scale of 10), mostly when getting up in the am.  He states he hasn't had any yet today but before today has had approximately 10 episodes with 4-6 pauses in a row.   He states he feels better today.  He takes Atenolol 25mg  daily.  He states his insurance requires a referral to a specialist which he has for Dr Myrtis Ser.  Craig [name] states he doesn't feel he needs to go to the emergency room and feels he needs some testing done.  He states he has gone to the local er in the past but states they do not have a cardiologist there to see him (based on past experiences).

## 2011-12-10 ENCOUNTER — Encounter: Payer: Self-pay | Admitting: Cardiology

## 2011-12-10 ENCOUNTER — Ambulatory Visit (INDEPENDENT_AMBULATORY_CARE_PROVIDER_SITE_OTHER): Payer: 59 | Admitting: Cardiology

## 2011-12-10 VITALS — BP 104/66 | HR 62 | Ht 68.0 in | Wt 263.0 lb

## 2011-12-10 DIAGNOSIS — R002 Palpitations: Secondary | ICD-10-CM

## 2011-12-10 DIAGNOSIS — R079 Chest pain, unspecified: Secondary | ICD-10-CM

## 2011-12-10 NOTE — Patient Instructions (Signed)
Your physician wants you to follow-up in:  6 months. You will receive a reminder letter in the mail two months in advance. If you don't receive a letter, please call our office to schedule the follow-up appointment.   

## 2011-12-10 NOTE — Assessment & Plan Note (Signed)
Patient wore an event recorder in the past in 2006 with no marked changes. There was question of recently wearing a Holter but he decided not to.

## 2011-12-10 NOTE — Progress Notes (Signed)
HPI Patient is seen today to followup palpitations. I've seen him recently in the office in [place] and wanted to see him back early followup. He's had some palpitations. However I'm not convinced that these are dangerous for him.  Allergies  Allergen Reactions  . Toprol Xl (Metoprolol Succinate) Anaphylaxis    Pt states cardiac arrest occurred when given. Dr Lars Mage as the EDP.  Marland Kitchen Wheat Other (See Comments)    Was told according to a skin test   . Ciprofloxacin Anxiety    nervous  . Zithromax (Azithromycin Dihydrate) Anxiety    Allergy if on long periods at a time    Current Outpatient Prescriptions  Medication Sig Dispense Refill  . allopurinol (ZYLOPRIM) 100 MG tablet Take 100 mg by mouth 2 (two) times daily.       Marland Kitchen ALPRAZolam (XANAX) 0.5 MG tablet Take by mouth. Take one tablet twice a day, takes 2 tablets at bedtime.      Marland Kitchen amitriptyline (ELAVIL) 25 MG tablet Take 25 mg by mouth at bedtime.        Marland Kitchen atenolol (TENORMIN) 25 MG tablet Take 25 mg by mouth daily.        . colchicine 0.6 MG tablet Take 0.6 mg by mouth daily. As needed for gout flare.      Marland Kitchen ibuprofen (ADVIL,MOTRIN) 200 MG tablet Take by mouth as needed. Headache/Pain      . indomethacin (INDOCIN) 25 MG capsule If needed for gout        History   Social History  . Marital Status: Married    Spouse Name: N/A    Number of Children: N/A  . Years of Education: N/A   Occupational History  . Not on file.   Social History Main Topics  . Smoking status: Former Games developer  . Smokeless tobacco: Not on file   Comment: Only smoked x 1 year in high school.  . Alcohol Use: Yes     occasional  . Drug Use: No  . Sexually Active: Yes   Other Topics Concern  . Not on file   Social History Narrative  . No narrative on file    No family history on file.  Past Medical History  Diagnosis Date  . Sleep apnea     Dr. Craige Cotta  . Ejection fraction     EF 60%, echo, June, 2012  . Gout   . Anxiety   . Palpitations     30  day event recorder, 2006, negative  . Tobacco abuse   . Diabetes mellitus   . Chest pain     Nuclear, 2006, normal,  /   stress echo, June, 2012, normal, EF 60%  . Asthma   . Diverticular disease   . Overweight   . Fatigue     Chronic  . Tachycardia     January, 2013    No past surgical history on file.  ROS Patient denies fever, chills, headache, sweats, rash, change in vision, change in hearing, cough, nausea vomiting, urinary symptoms. All other systems are reviewed and are negative.  PHYSICAL EXAM  Patient is stable today. He is overweight. There is no jugulovenous distention. Lungs are clear. Respiratory effort is nonlabored. Cardiac exam reveals S1 and S2. There no clicks or significant murmurs. The abdomen is soft. There is no peripheral edema.  Filed Vitals:   12/10/11 1124  BP: 104/66  Pulse: 62  Height: 5\' 8"  (1.727 m)  Weight: 263 lb (119.296 kg)  ASSESSMENT & PLAN

## 2011-12-10 NOTE — Assessment & Plan Note (Signed)
He has rare chest pain. I feel it is nonischemic. No further workup.

## 2012-01-29 ENCOUNTER — Ambulatory Visit: Payer: 59 | Admitting: Cardiology

## 2012-03-03 ENCOUNTER — Ambulatory Visit (HOSPITAL_COMMUNITY)
Admission: RE | Admit: 2012-03-03 | Discharge: 2012-03-03 | Disposition: A | Discharge status: Home / Self Care | Payer: 59 | Source: Ambulatory Visit | Attending: Physician Assistant | Admitting: Physician Assistant

## 2012-03-03 ENCOUNTER — Other Ambulatory Visit (HOSPITAL_COMMUNITY): Payer: Self-pay | Admitting: Physician Assistant

## 2012-03-03 DIAGNOSIS — M76899 Other specified enthesopathies of unspecified lower limb, excluding foot: Secondary | ICD-10-CM | POA: Insufficient documentation

## 2012-03-03 DIAGNOSIS — M25569 Pain in unspecified knee: Secondary | ICD-10-CM | POA: Insufficient documentation

## 2012-03-05 ENCOUNTER — Telehealth: Payer: Self-pay | Admitting: Cardiology

## 2012-03-05 NOTE — Telephone Encounter (Signed)
Craig [name] states he has been having increased PVC's while at rest the past 2-3 days.  He states he jumped off a tailgate 2+ weeks ago.  He is also on a weight loss diet.  He states when he lays on his left side they are worse.  BP=152/90.  He has been taking Advil 6/day (otc strength).  He is taking his Atenolol daily without fail.  NO chest pain, NO sob.  He states he is also under some increased stress and anxiety recently.  He has recently started on rapaflow for several months.

## 2012-03-05 NOTE — Telephone Encounter (Signed)
Advil and Rapaflo not reported to cause palpitations.  Diet should not cause it.  Pt warned to avoid weight loss medications or supplements.  Information per Dr Myrtis Ser.  Pt was notified.

## 2012-03-05 NOTE — Telephone Encounter (Signed)
Pt calling re an increase in PVC's while at rest last couple days [phone]

## 2012-03-07 ENCOUNTER — Telehealth: Payer: Self-pay | Admitting: Adult Health

## 2012-03-07 NOTE — Telephone Encounter (Signed)
Mr. Standish called complaining of increasing palpitations. He said he had an 8 beat run just before calling. He admits to being very anxious about this and hypersensitive to the frequency of his PVCs. He was seen last by Dr. Myrtis Ser in April of 2013. No medications were adjusted at that time. The patient states he is been under a lot of stress and has not sleeping and is not taking his anti-anxiety medication as directed.  The last office visit there was a mention of need to place a cardiac monitor for frequency and duration of his arrhythmias. This was not completed at that time. I have suggested to him to begin over-the-counter magnesium twice a day. To take his anti-anxiety medicine as directed.Marland Kitchen He wishes to be seen sooner than planned followup appointment in September of 2013 secondary to the increased palpitations. I will call the office to leave a message to have them call him on Monday to set up an appointment and possibly lab work to evaluate kidney function as he takes NSAIS  And evaluation of potassium. More recommendations per Dr. Myrtis Ser once labs are available.

## 2012-03-09 ENCOUNTER — Other Ambulatory Visit: Payer: Self-pay

## 2012-03-09 DIAGNOSIS — R5383 Other fatigue: Secondary | ICD-10-CM

## 2012-03-09 DIAGNOSIS — R002 Palpitations: Secondary | ICD-10-CM

## 2012-03-09 NOTE — Telephone Encounter (Signed)
Pt is insisting on an appt with one of the physician's assistants or nurse practitioners.   Appt scheduled with Ward Givens on 03/10/12.

## 2012-03-09 NOTE — Telephone Encounter (Signed)
Fu msg Pt is still having pvc's. Please call

## 2012-03-09 NOTE — Telephone Encounter (Signed)
Pt states he had a miserable weekend.  Had up to 10 pvs at a time, constantly throughout the day.  He states he talked to the on-call PA this weekend.  He was told to start magnesium but is not sure if it helped or not.  He states he is not sleeping and this makes the pvcs worse.  BP has been112/72-135/74.  Pt is requesting an appt0 with Dr Myrtis Ser.

## 2012-03-10 ENCOUNTER — Encounter: Payer: Self-pay | Admitting: Nurse Practitioner

## 2012-03-10 ENCOUNTER — Other Ambulatory Visit (INDEPENDENT_AMBULATORY_CARE_PROVIDER_SITE_OTHER): Payer: 59

## 2012-03-10 ENCOUNTER — Ambulatory Visit (INDEPENDENT_AMBULATORY_CARE_PROVIDER_SITE_OTHER): Payer: 59 | Admitting: Nurse Practitioner

## 2012-03-10 VITALS — BP 125/73 | HR 74 | Ht 68.0 in | Wt 259.8 lb

## 2012-03-10 DIAGNOSIS — R0989 Other specified symptoms and signs involving the circulatory and respiratory systems: Secondary | ICD-10-CM

## 2012-03-10 DIAGNOSIS — R002 Palpitations: Secondary | ICD-10-CM

## 2012-03-10 MED ORDER — ATENOLOL 25 MG PO TABS: 25.0000 mg | ORAL_TABLET | Freq: Two times a day (BID) | ORAL | Status: DC

## 2012-03-10 MED ORDER — ATENOLOL 50 MG PO TABS: 50.0000 mg | ORAL_TABLET | Freq: Every day | ORAL | Status: DC

## 2012-03-10 NOTE — Patient Instructions (Addendum)
Your physician recommends that you keep your follow-up appointment as scheduled with Dr Myrtis Ser Your physician recommends that you return for lab work drawn today (BMP, TSH, Magnesium) Your physician has recommended you make the following change in your medication: INCREASE Atenolol to 25 mg twice daily  Your physician has recommended that you wear an event monitor. Event monitors are medical devices that record the heart's electrical activity. Doctors most often [place] these monitors to diagnose arrhythmias. Arrhythmias are problems with the speed or rhythm of the heartbeat. The monitor is a small, portable device. You can wear one while you do your normal daily activities. This is usually used to diagnose what is causing palpitations/syncope (passing out). (21 DAYS)

## 2012-03-10 NOTE — Progress Notes (Signed)
Patient Name: Craig [name] Date of Encounter: 03/10/2012  Primary Care Provider:  Cassell Smiles., MD Primary Cardiologist:  Lovena Neighbours, MD  Patient Profile  56 year old male with history of palpitations who presents with recurrence.  Problem List   Past Medical History  Diagnosis Date  . Sleep apnea     Dr. Craige Cotta  . Ejection fraction     EF 60%, echo, June, 2012  . Gout   . Anxiety   . Palpitations     30 day event recorder, 2006, negative  . Tobacco abuse   . Diabetes mellitus   . Chest pain     Nuclear, 2006, normal,  /   stress echo, June, 2012, normal, EF 60%  . Asthma   . Diverticular disease   . Overweight   . Fatigue     Chronic  . Tachycardia     January, 2013   History reviewed. No pertinent past surgical history.  Allergies  Allergies  Allergen Reactions  . Toprol Xl (Metoprolol Succinate) Anaphylaxis    Pt states cardiac arrest occurred when given. Dr Lars Mage as the EDP.  Marland Kitchen Wheat Other (See Comments)    Was told according to a skin test   . Ciprofloxacin Anxiety    nervous  . Zithromax (Azithromycin Dihydrate) Anxiety    Allergy if on long periods at a time    HPI  56 year old male with history of palpitations as outlined above.  He reports a significant history of symptomatic PVCs.  Over this most recent weekend he noted frequent palpitations and felt as though he was having runs of PVCs up to as many as 8 or 10 beats.  With isolated beats, he simply notes a flip flopping in his chest whereas with more persistent runs, he feels a little bit lightheaded.  He denies chest pain, PND, orthopnea, syncope, or edema.  He has been having some bilateral knee pain after jumping off the back of his pickup truck which was about 3 feet and landing firmly on the ground.  This occurred a few months ago and he plans to follow up with orthopedics.  Home Medications  Prior to Admission medications   Medication Sig Start Date End Date Taking? Authorizing Provider    allopurinol (ZYLOPRIM) 100 MG tablet Take 100 mg by mouth 2 (two) times daily.    Yes Historical Provider, MD  ALPRAZolam Prudy Feeler) 0.5 MG tablet Take by mouth. Take one tablet twice a day, takes 2 tablets at bedtime.   Yes Historical Provider, MD  amitriptyline (ELAVIL) 25 MG tablet Take 25 mg by mouth at bedtime.     Yes Historical Provider, MD  atenolol (TENORMIN) 25 MG tablet Take 1 tablet (25 mg total) by mouth 2 (two) times daily. 03/10/12  Yes Ok Anis, NP  colchicine 0.6 MG tablet Take 0.6 mg by mouth daily. As needed for gout flare.   Yes Historical Provider, MD  indomethacin (INDOCIN) 25 MG capsule If needed for gout   Yes Historical Provider, MD  ibuprofen (ADVIL,MOTRIN) 200 MG tablet Take by mouth as needed. Headache/Pain    Historical Provider, MD    Review of Systems  Palpitations as outlined above.  He also arm to bilateral knee pain.  He admits to feeling depressed and very anxious especially in light of more frequent palpitations.  All other systems reviewed and are otherwise negative except as noted above.  Physical Exam  Blood pressure 125/73, pulse 74, height 5\' 8"  (1.727 m), weight  259 lb 12.8 oz (117.845 kg).  General: Pleasant, NAD Psych: Normal affect. Neuro: Alert and oriented X 3. Moves all extremities spontaneously. HEENT: Normal  Neck: Supple without bruits or JVD. Lungs:  Resp regular and unlabored, CTA. Heart: RRR no s3, s4, or murmurs. Abdomen: Soft, non-tender, non-distended, BS + x 4.  Extremities: No clubbing, cyanosis or edema. DP/PT/Radials 2+ and equal bilaterally.  Accessory Clinical Findings  ECG - sinus rhythm, 70, no acute ST or T changes.  Assessment & Plan  1.  Palpitations: The patient has a long history of palpitations which worsened over the weekend.  I will check a basic metabolic profile, magnesium, and TSH.  We'll increase his atenolol to 25 mg b.i.d.  I have set him up for 21 day of that monitor.  He will follow up with Dr.  Myrtis Ser in a few weeks.  2.  Depression and anxiety: Suspect that this may be contributing to symptoms.  Patient feels the same.  He will followup with his primary care provider.  3.  Disposition: testing as above.  Follow up with Dr. Myrtis Ser in a few weeks.    Nicolasa Ducking, NP 03/10/2012, 5:26 PM

## 2012-03-11 LAB — BASIC METABOLIC PANEL
CO2: 27 mEq/L (ref 19–32)
Calcium: 9.6 mg/dL (ref 8.4–10.5)
Chloride: 104 mEq/L (ref 96–112)
Creatinine, Ser: 1 mg/dL (ref 0.4–1.5)
Glucose, Bld: 98 mg/dL (ref 70–99)

## 2012-03-16 NOTE — Telephone Encounter (Signed)
N/A.  LMTC. 

## 2012-03-16 NOTE — Telephone Encounter (Signed)
Pt states he did not increase his atenolol.  He started walking and the PVCs have stopped.  He states he is sleeping better which he thinks may be a lot of the reason they stopped.  He states he canceled the appt for the monitor at this time.  FYI per pt.

## 2012-03-16 NOTE — Telephone Encounter (Signed)
New problem:  Patient calling, concerns regarding problems he was having,. Also has a personal question will discuss when nurse call.

## 2012-03-17 NOTE — Telephone Encounter (Signed)
agree

## 2012-05-01 ENCOUNTER — Ambulatory Visit: Payer: 59 | Admitting: Cardiology

## 2012-06-09 ENCOUNTER — Encounter: Payer: Self-pay | Admitting: Cardiology

## 2012-07-20 ENCOUNTER — Telehealth: Payer: Self-pay | Admitting: Cardiology

## 2012-07-20 NOTE — Telephone Encounter (Signed)
New Problem: ° ° ° °Patient called in wanting to speak with you about something.  Please call back. °

## 2012-07-20 NOTE — Telephone Encounter (Signed)
Pt wanting to know what decongestant he can take?  I told him he can take an antihistamine or mucinex but no decongestant.  He appears to understand.

## 2012-07-24 ENCOUNTER — Telehealth: Payer: Self-pay | Admitting: Cardiology

## 2012-07-24 NOTE — Telephone Encounter (Signed)
New problem:    Returning call back to nurse from yesterday  

## 2012-07-24 NOTE — Telephone Encounter (Signed)
Called in wanting to know if he can take Albuterol for a chest cold.  I told him no, he states his wife was given this.  I told him he should not take this nor should he take other medications prescribed to other people

## 2013-01-20 ENCOUNTER — Telehealth: Payer: Self-pay | Admitting: Cardiology

## 2013-01-20 NOTE — Telephone Encounter (Signed)
New problem   Pt started having PVC's on Sunday and since he has been really tired and fatigue. Pt stated he just doesn't feel right.  Please call pt concerning this matter.

## 2013-01-20 NOTE — Telephone Encounter (Signed)
**Note De-Identified  Obfuscation** Per Dr Myrtis Ser, pt is advised to take Atenolol 25 mg bid as directed and to call back if PVC's persist or worsens, he verbalized understanding.

## 2013-01-21 ENCOUNTER — Encounter: Payer: Self-pay | Admitting: Cardiology

## 2013-01-22 ENCOUNTER — Telehealth: Payer: Self-pay | Admitting: Physician Assistant

## 2013-01-22 NOTE — Telephone Encounter (Signed)
Patient called the answering svc c/o elevated HR to 90 bpm. He reports experiencing intermittent episodes of elevated HR and palpitations. Pulse check on BP monitor reports 90 bpm. This has occurred since Sunday and has woken him up on occasion. He denies increased caffeine use, OTC medications, decreased fluid intake and anxiety. He does snore at night and believe this may be related to sleep apnea. He has never been tested for this. He also admits to being overweight, and this likely attributing. He reports seeing his PCP recently and his WBC count was mildly elevated. He has a history of urinary retention and was recently called in Keflex for suspected UTI. He has not taken the antibiotic yet and wanted approval before doing so from a cardiac standpoint. Advised to take the Keflex as prescribed for suspect UTI, take an extra atenolol PRN and to stay hydrated. He understood and agreed. Will arrange follow-up next week.    Jacqulyn Bath, PA-C 01/22/2013 5:54 PM

## 2013-02-05 ENCOUNTER — Encounter: Payer: Self-pay | Admitting: Cardiology

## 2013-02-05 ENCOUNTER — Other Ambulatory Visit: Payer: Self-pay | Admitting: *Deleted

## 2013-02-05 ENCOUNTER — Ambulatory Visit (INDEPENDENT_AMBULATORY_CARE_PROVIDER_SITE_OTHER): Payer: 59 | Admitting: Cardiology

## 2013-02-05 VITALS — BP 102/66 | HR 68 | Ht 68.0 in | Wt 264.8 lb

## 2013-02-05 DIAGNOSIS — R002 Palpitations: Secondary | ICD-10-CM

## 2013-02-05 DIAGNOSIS — R079 Chest pain, unspecified: Secondary | ICD-10-CM

## 2013-02-05 NOTE — Progress Notes (Signed)
HPI  Patient is seen to followup palpitations. He had called recently when he had increased palpitations. After hearing a full discussion today it appears that he actually had some urinary obstruction. Once this was relieved by receiving some antibiotics, he felt much better and his palpitations decreased.  Allergies  Allergen Reactions  . Toprol Xl (Metoprolol Succinate) Anaphylaxis    Pt states cardiac arrest occurred when given. Dr Lars Mage as the EDP.  Marland Kitchen Wheat Other (See Comments)    Was told according to a skin test   . Ciprofloxacin Anxiety    nervous  . Zithromax (Azithromycin Dihydrate) Anxiety    Allergy if on long periods at a time    Current Outpatient Prescriptions  Medication Sig Dispense Refill  . allopurinol (ZYLOPRIM) 100 MG tablet Take 100 mg by mouth 2 (two) times daily.       Marland Kitchen ALPRAZolam (XANAX) 0.5 MG tablet Take by mouth. Take one tablet twice a day, takes 2 tablets at bedtime.      Marland Kitchen amitriptyline (ELAVIL) 25 MG tablet Take 25 mg by mouth at bedtime.        Marland Kitchen atenolol (TENORMIN) 25 MG tablet Take 1 tablet (25 mg total) by mouth 2 (two) times daily.  [address]80 tablet  2  . colchicine 0.6 MG tablet Take 0.6 mg by mouth daily. As needed for gout flare.      Marland Kitchen ibuprofen (ADVIL,MOTRIN) 200 MG tablet Take by mouth as needed. Headache/Pain      . indomethacin (INDOCIN) 25 MG capsule If needed for gout       No current facility-administered medications for this visit.    History   Social History  . Marital Status: Married    Spouse Name: N/A    Number of Children: N/A  . Years of Education: N/A   Occupational History  . Not on file.   Social History Main Topics  . Smoking status: Former Games developer  . Smokeless tobacco: Not on file     Comment: Only smoked x 1 year in high school.  . Alcohol Use: Yes     Comment: occasional  . Drug Use: No  . Sexually Active: Yes   Other Topics Concern  . Not on file   Social History Narrative  . No narrative on file     Family History  Problem Relation Age of Onset  . Hypertension Mother     Past Medical History  Diagnosis Date  . Sleep apnea     Dr. Craige Cotta  . Ejection fraction     EF 60%, echo, June, 2012  . Gout   . Anxiety   . Palpitations     30 day event recorder, 2006, negative  . Tobacco abuse   . Diabetes mellitus   . Chest pain     Nuclear, 2006, normal,  /   stress echo, June, 2012, normal, EF 60%  . Asthma   . Diverticular disease   . Overweight(278.02)   . Fatigue     Chronic  . Tachycardia     January, 2013    History reviewed. No pertinent past surgical history.  Patient Active Problem List   Diagnosis Date Noted  . Tachycardia   . Ejection fraction   . Palpitations   . Chest pain   . Sleep apnea   . Gout   . Anxiety   . Tobacco abuse   . Diabetes mellitus   . Asthma   . Diverticular disease   .  Overweight   . Fatigue     ROS   Patient denies fever, chills, headache, sweats, rash, change in vision, change in hearing, chest pain, cough, nausea vomiting, . All the systems are reviewed and are negative.  PHYSICAL EXAM  Patient is here today with his wife. He is overweight. He is stable. There is no jugulovenous distention. Lungs are clear. Respiratory effort is not labored. Cardiac exam reveals S1 and S2. There no clicks or significant murmurs. The abdomen is soft. There is no peripheral edema.  Filed Vitals:   02/05/13 1545  BP: 102/66  Pulse: 68  Height: 5\' 8"  (1.727 m)  Weight: 264 lb 12.8 oz (120.112 kg)  SpO2: 94%     ASSESSMENT & PLAN

## 2013-02-05 NOTE — Patient Instructions (Addendum)
**Note De-identified  Obfuscation** Your physician recommends that you continue on your current medications as directed. Please refer to the Current Medication list given to you today.  Your physician wants you to follow-up in: 1 year. You will receive a reminder letter in the mail two months in advance. If you don't receive a letter, please call our office to schedule the follow-up appointment.  

## 2013-02-05 NOTE — Assessment & Plan Note (Signed)
He has not had any significant chest pain. No further workup.

## 2013-02-05 NOTE — Assessment & Plan Note (Signed)
Patient had increased palpitations recently. It seems to be related to an episode of probable prostatitis with some urinary obstruction. He felt poorly and then noticed increased palpitations. This is all improved. No further workup is needed.

## 2013-04-08 ENCOUNTER — Telehealth: Payer: Self-pay | Admitting: Cardiology

## 2013-04-08 NOTE — Telephone Encounter (Signed)
New Prob     Pt would like to speak to a nurse regarding his PVCs. Please call.

## 2013-04-08 NOTE — Telephone Encounter (Signed)
I spoke with the pt and he is calling because of increased PVCs.  The pt said he noticed more pronounced PVCs on 19-Feb-2023 night and the pt went to bed.  Tuesday morning the pt woke up with frequent PVCs and he took an Atenolol and that helped his symptoms. As the day progressed his PVCs returned.  The pt did take another Atenolol Tuesday night. Yesterday the pt was able to mow the yard but last night he developed PVCs while sitting at his compter. The pt took 50mg  of Atenolol.  The pt has also noticed that his BP has been a little higher at times, 160/80. The pt is anxious and at this time has been scheduled to see the PA on 04/28/13.  The pt would like an earlier appointment with Dr Myrtis Ser if available. In reviewing the pt's Atenolol dose he is only taking 25mg  daily.  The pt said he did not make the change of increasing Atenolol to 25mg  twice a day as instructed at 03/10/12 office visit. I instructed the pt to increase Atenolol to 25mg  twice a day or Atenolol 50mg  daily which ever he prefers.  The pt does not consume caffeine and I advised him to remain well hydrated.  I will forward this message to [facility] to try and arrange earlier follow-up with Dr Myrtis Ser per the pt's request.

## 2013-04-09 NOTE — Telephone Encounter (Addendum)
The pt states that his PVC's have improved but that he feels fatigued and weak since he increased Atenolol to 25 mg BID on 7/30. Pt is advised that the increase in Atenolol maybe the reason for his fatigue and weakness and to give himself more time to adjust to medication change and that if this continues to call us back later next week. Pt is also advised that the appt he has scheduled with Leda Gauze, PA-C on 8/20 is the soonest available appt we have at this time and that he should call us back if his s/s worsen and we will attempted to get him in sooner, he verbalized understanding to all information given.

## 2013-04-28 ENCOUNTER — Ambulatory Visit (INDEPENDENT_AMBULATORY_CARE_PROVIDER_SITE_OTHER): Payer: 59 | Admitting: Physician Assistant

## 2013-04-28 ENCOUNTER — Encounter: Payer: Self-pay | Admitting: Physician Assistant

## 2013-04-28 VITALS — BP 109/73 | HR 70 | Ht 68.0 in | Wt 265.0 lb

## 2013-04-28 DIAGNOSIS — R002 Palpitations: Secondary | ICD-10-CM

## 2013-04-28 DIAGNOSIS — F419 Anxiety disorder, unspecified: Secondary | ICD-10-CM

## 2013-04-28 DIAGNOSIS — R000 Tachycardia, unspecified: Secondary | ICD-10-CM

## 2013-04-28 DIAGNOSIS — F411 Generalized anxiety disorder: Secondary | ICD-10-CM

## 2013-04-28 LAB — BASIC METABOLIC PANEL
CO2: 29 mEq/L (ref 19–32)
Calcium: 9.3 mg/dL (ref 8.4–10.5)
Chloride: 105 mEq/L (ref 96–112)
Creatinine, Ser: 0.9 mg/dL (ref 0.4–1.5)
Glucose, Bld: 85 mg/dL (ref 70–99)

## 2013-04-28 NOTE — Assessment & Plan Note (Addendum)
Patient has increase in PVCs recently associated with increase  stress at home. We will check labs to make sure his potassium and TSH are stable. I told him he can take an extra atenolol 12.5-25 mg when necessary for palpitations. He will followup with Dr. Myrtis Ser.

## 2013-04-28 NOTE — Patient Instructions (Addendum)
Your physician has recommended you make the following change in your medication:   IT'S OK TO TAKE AN EXTRA 0.5-1 TABLETS OF ATENOLOL IN NEEDED FOR PALPITATIONS   Your physician recommends that you return for lab work in:  BMET AND TSH TODAY  Your physician recommends that you schedule a follow-up appointment in:  2 MONTHS WITH DR. KATZ
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
## 2013-04-28 NOTE — Assessment & Plan Note (Signed)
Contributing to his palpitations

## 2013-05-01 ENCOUNTER — Other Ambulatory Visit: Payer: Self-pay | Admitting: Nurse Practitioner

## 2013-06-28 ENCOUNTER — Ambulatory Visit: Payer: 59 | Admitting: Cardiology

## 2014-01-06 ENCOUNTER — Other Ambulatory Visit: Payer: Self-pay | Admitting: Orthopedic Surgery

## 2014-01-06 DIAGNOSIS — M545 Low back pain, unspecified: Secondary | ICD-10-CM

## 2014-01-13 ENCOUNTER — Other Ambulatory Visit: Payer: Self-pay | Admitting: Orthopedic Surgery

## 2014-01-13 DIAGNOSIS — Z139 Encounter for screening, unspecified: Secondary | ICD-10-CM

## 2014-01-17 ENCOUNTER — Ambulatory Visit
Admission: RE | Admit: 2014-01-17 | Discharge: 2014-01-17 | Disposition: A | Discharge status: Home / Self Care | Payer: 59 | Source: Ambulatory Visit | Attending: Orthopedic Surgery | Admitting: Orthopedic Surgery

## 2014-01-17 ENCOUNTER — Other Ambulatory Visit: Payer: Self-pay | Admitting: Orthopedic Surgery

## 2014-01-17 ENCOUNTER — Ambulatory Visit: Admission: RE | Admit: 2014-01-17 | Payer: 59 | Source: Ambulatory Visit

## 2014-01-17 DIAGNOSIS — M545 Low back pain, unspecified: Secondary | ICD-10-CM

## 2014-01-17 DIAGNOSIS — Z139 Encounter for screening, unspecified: Secondary | ICD-10-CM

## 2014-02-24 ENCOUNTER — Ambulatory Visit (INDEPENDENT_AMBULATORY_CARE_PROVIDER_SITE_OTHER): Payer: 59

## 2014-02-24 ENCOUNTER — Other Ambulatory Visit: Payer: Self-pay | Admitting: Orthopedic Surgery

## 2014-02-24 DIAGNOSIS — R52 Pain, unspecified: Secondary | ICD-10-CM

## 2014-02-24 DIAGNOSIS — M79609 Pain in unspecified limb: Secondary | ICD-10-CM

## 2014-06-10 ENCOUNTER — Telehealth: Payer: Self-pay | Admitting: *Deleted

## 2014-06-10 NOTE — Telephone Encounter (Signed)
Spoke with patient asking for a refill on his atenolol 25mg . He stated that his primary physician usually refills this medication, but they did not return his phone call. Pt. Has not been seen in this office since 04/2013, so I informed him that he would need an appointment, and then I could fill the medication to get him through his appointment date. He did not want to make an appointment, he said he would just try to contact his primary again for the refill, and that he would call back to schedule an appointment with Dr. Ron Parker.

## 2014-06-13 ENCOUNTER — Ambulatory Visit: Payer: 59 | Admitting: Physician Assistant

## 2014-06-22 ENCOUNTER — Ambulatory Visit: Payer: 59 | Admitting: Cardiology

## 2015-02-18 ENCOUNTER — Encounter: Payer: Self-pay | Admitting: *Deleted

## 2015-02-18 ENCOUNTER — Emergency Department
Admission: EM | Admit: 2015-02-18 | Discharge: 2015-02-18 | Disposition: A | Discharge status: Home / Self Care | Payer: 59 | Source: Home / Self Care | Attending: Family Medicine | Admitting: Family Medicine

## 2015-02-18 DIAGNOSIS — S80861A Insect bite (nonvenomous), right lower leg, initial encounter: Secondary | ICD-10-CM | POA: Diagnosis not present

## 2015-02-18 DIAGNOSIS — W57XXXA Bitten or stung by nonvenomous insect and other nonvenomous arthropods, initial encounter: Secondary | ICD-10-CM

## 2015-02-18 MED ORDER — MUPIROCIN CALCIUM 2 % EX CREA: 1.0000 "application " | TOPICAL_CREAM | Freq: Three times a day (TID) | CUTANEOUS | Status: DC

## 2015-02-18 NOTE — ED Notes (Signed)
Pt noticed a red raised spot possibly an insect bite on his R calf 3 days ago.  He states it itches a little bit denies pain, does not remember actually getting bit by anything.  He now noticed a couple of small red lines coming from it.  Pt has been moving from an apartment to a house and has had a lot of stress and anxiety and is very concerned about the bump.

## 2015-02-18 NOTE — ED Provider Notes (Signed)
CSN: [id number]     Arrival date & time 02/18/15  1659 History   First MD Initiated Contact with Patient 02/18/15 1719     Chief Complaint  Patient presents with  . Insect Bite    R calf      HPI Comments: Four days ago patient noticed a small red spot on his right medial calf which has persisted and seems to be somewhat larger now.  It itches slightly but is not painful.  He is concerned that he may have had an insect bite, although was not aware of a bite.  No tick bites.  He feels well otherwise.  No fevers, chills, and sweats.  The history is provided by the patient.    Past Medical History  Diagnosis Date  . Sleep apnea     Dr. Halford Chessman  . Ejection fraction     EF 60%, echo, June, 2012  . Gout   . Anxiety   . Palpitations     30 day event recorder, 2006, negative  . Tobacco abuse   . Diabetes mellitus   . Chest pain     Nuclear, 2006, normal,  /   stress echo, June, 2012, normal, EF 60%  . Asthma   . Diverticular disease   . Overweight(278.02)   . Fatigue     Chronic  . Tachycardia     January, 2013   History reviewed. No pertinent past surgical history. Family History  Problem Relation Age of Onset  . Hypertension Mother    History  Substance Use Topics  . Smoking status: Former Research scientist (life sciences)  . Smokeless tobacco: Never Used     Comment: Only smoked x 1 year in high school.  . Alcohol Use: Yes     Comment: occasional    Review of Systems  Constitutional: Negative.   HENT: Negative.   Eyes: Negative.   Respiratory: Negative.   Cardiovascular: Negative.   Gastrointestinal: Negative.   Genitourinary: Negative.   Musculoskeletal: Negative.   Skin: Positive for color change.  Neurological: Negative.     Allergies  Toprol xl; Other; Wheat; Ciprofloxacin; and Zithromax  Home Medications   Prior to Admission medications   Medication Sig Start Date End Date Taking? Authorizing Provider  silodosin (RAPAFLO) 4 MG CAPS capsule Take 8 mg by mouth daily with  breakfast.    Yes Historical Provider, MD  allopurinol (ZYLOPRIM) 100 MG tablet Take 100 mg by mouth 2 (two) times daily.     Historical Provider, MD  ALPRAZolam Duanne Moron) 0.5 MG tablet Take by mouth 4 (four) times daily. Take one tablet twice a day, takes 2 tablets at bedtime.    Historical Provider, MD  amitriptyline (ELAVIL) 25 MG tablet Take 25 mg by mouth at bedtime.      Historical Provider, MD  atenolol (TENORMIN) 25 MG tablet Take 25 mg by mouth daily. 03/10/12   Rogelia Mire, NP  atenolol (TENORMIN) 25 MG tablet take 1 tablet by mouth twice a day 05/01/13   Carlena Bjornstad, MD  colchicine 0.6 MG tablet Take 0.6 mg by mouth daily. As needed for gout flare.    Historical Provider, MD  ibuprofen (ADVIL,MOTRIN) 200 MG tablet Take by mouth as needed. Headache/Pain    Historical Provider, MD  indomethacin (INDOCIN) 25 MG capsule If needed for gout    Historical Provider, MD  mupirocin cream (BACTROBAN) 2 % Apply 1 application topically 3 (three) times daily. 02/18/15   Kandra Nicolas, MD   BP  106/69 mmHg  Pulse 71  Temp(Src) 98.1 F (36.7 C) (Oral)  Ht 5\' 8"  (1.727 m)  Wt 255 lb (115.667 kg)  BMI 38.78 kg/m2  SpO2 95% Physical Exam  Constitutional: He is oriented to person, place, and time. He appears well-developed and well-nourished. No distress.  Patient is obese (BMI 38.8)  HENT:  Head: Normocephalic.  Nose: Nose normal.  Mouth/Throat: Oropharynx is clear and moist.  Eyes: Conjunctivae are normal. Pupils are equal, round, and reactive to light.  Neck: Neck supple.  Pulmonary/Chest: Breath sounds normal.  Abdominal: There is no tenderness.  Musculoskeletal: He exhibits no edema or tenderness.       Legs: Right medial calf has a 81mm by 47mm area of macular erythema as noted on diagram.  No induration or fluctuance.  No tenderness to palpation     Lymphadenopathy:    He has no cervical adenopathy.  Neurological: He is alert and oriented to person, place, and time.  Skin:  Skin is warm and dry.  Nursing note and vitals reviewed.   ED Course  Procedures  none   MDM   1. ?Insect bite of right leg, initial encounter    Will empirically begin Bactroban cream TID.  Use until resolved.  May apply 1% Hydrocortisone cream 2 or 3 times daily if necessary for itching. Followup with Family Doctor if not improved in one week.      Kandra Nicolas, MD 02/25/15 1200

## 2015-02-18 NOTE — Discharge Instructions (Signed)
May apply 1% Hydrocortisone cream 2 or 3 times daily if necessary for itching.   Insect Bite Mosquitoes, flies, fleas, bedbugs, and many other insects can bite. Insect bites are different from insect stings. A sting is when venom is injected into the skin. Some insect bites can transmit infectious diseases. SYMPTOMS  Insect bites usually turn red, swell, and itch for 2 to 4 days. They often go away on their own. TREATMENT  Your caregiver may prescribe antibiotic medicines if a bacterial infection develops in the bite. HOME CARE INSTRUCTIONS  Do not scratch the bite area.  Keep the bite area clean and dry. Wash the bite area thoroughly with soap and water.  Put ice or cool compresses on the bite area.  Put ice in a plastic bag.  Place a towel between your skin and the bag.  Leave the ice on for 20 minutes, 4 times a day for the first 2 to 3 days, or as directed.  You may apply a baking soda paste, cortisone cream, or calamine lotion to the bite area as directed by your caregiver. This can help reduce itching and swelling.  Only take over-the-counter or prescription medicines as directed by your caregiver.  If you are given antibiotics, take them as directed. Finish them even if you start to feel better. You may need a tetanus shot if:  You cannot remember when you had your last tetanus shot.  You have never had a tetanus shot.  The injury broke your skin. If you get a tetanus shot, your arm may swell, get red, and feel warm to the touch. This is common and not a problem. If you need a tetanus shot and you choose not to have one, there is a rare chance of getting tetanus. Sickness from tetanus can be serious. SEEK IMMEDIATE MEDICAL CARE IF:   You have increased pain, redness, or swelling in the bite area.  You see a red line on the skin coming from the bite.  You have a fever.  You have joint pain.  You have a headache or neck pain.  You have unusual weakness.  You  have a rash.  You have chest pain or shortness of breath.  You have abdominal pain, nausea, or vomiting.  You feel unusually tired or sleepy. MAKE SURE YOU:   Understand these instructions.  Will watch your condition.  Will get help right away if you are not doing well or get worse. Document Released: 10/03/2004 Document Revised: 11/18/2011 Document Reviewed: 03/27/2011 [facility] Patient Information 2015 [place], [place]. This information is not intended to replace advice given to you by your health care provider. Make sure you discuss any questions you have with your health care provider.

## 2015-05-25 ENCOUNTER — Emergency Department
Admission: EM | Admit: 2015-05-25 | Discharge: 2015-05-25 | Disposition: A | Discharge status: Home / Self Care | Payer: Managed Care, Other (non HMO) | Source: Home / Self Care | Attending: Family Medicine | Admitting: Family Medicine

## 2015-05-25 ENCOUNTER — Encounter: Payer: Self-pay | Admitting: *Deleted

## 2015-05-25 DIAGNOSIS — W57XXXA Bitten or stung by nonvenomous insect and other nonvenomous arthropods, initial encounter: Secondary | ICD-10-CM | POA: Diagnosis not present

## 2015-05-25 DIAGNOSIS — T148 Other injury of unspecified body region: Secondary | ICD-10-CM

## 2015-05-25 DIAGNOSIS — R21 Rash and other nonspecific skin eruption: Secondary | ICD-10-CM | POA: Diagnosis not present

## 2015-05-25 MED ORDER — PERMETHRIN 5 % EX CREA: TOPICAL_CREAM | CUTANEOUS | Status: DC

## 2015-05-25 MED ORDER — TRIAMCINOLONE ACETONIDE 0.1 % EX CREA: 1.0000 "application " | TOPICAL_CREAM | Freq: Two times a day (BID) | CUTANEOUS | Status: DC

## 2015-05-25 NOTE — ED Provider Notes (Signed)
CSN: [id number]     Arrival date & time 05/25/15  1245 History   First MD Initiated Contact with Patient 05/25/15 1302     Chief Complaint  Patient presents with  . Rash   (Consider location/radiation/quality/duration/timing/severity/associated sxs/prior Treatment) HPI  Pt is a 59yo male presenting to [facility] with concern for rash caused by his Allopurinol.  Pt noticed several small red raised bumps on his Left hand, fingers, and Right calf yesterday.  Rash does not hurt or itch. Rash is mild and diffuse on hand and calf. He has been taking the same dose of Allopurinol since 2002 but just noticed the rash yesterday.  Pt has taken 2 doses of Vitamin B12 for his chronic fatigue but does not believe that's what caused his rash.  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  Pt also notes he is currently staying in an apartment with is wife who does not have any rashes but states a few months ago he slept at his old house and noticed a small similar red spot on his leg.  He was given cream at that time and it went away.  He does not recall the name of the cream he was given.  Denies fever, chills, n/v/d. Denies difficulty breathing or swallowing. Denies oral swelling.  Denies sick contacts or recent travel.  Past Medical History  Diagnosis Date  . Sleep apnea     Dr. Halford Chessman  . Ejection fraction     EF 60%, echo, June, 2012  . Gout   . Anxiety   . Palpitations     30 day event recorder, 2006, negative  . Tobacco abuse   . Diabetes mellitus   . Chest pain     Nuclear, 2006, normal,  /   stress echo, June, 2012, normal, EF 60%  . Asthma   . Diverticular disease   . Overweight(278.02)   . Fatigue     Chronic  . Tachycardia     January, 2013   History reviewed. No pertinent past surgical history. Family History  Problem Relation Age of Onset  . Hypertension Mother     Social History  Substance Use Topics  . Smoking status: Former Research scientist (life sciences)  . Smokeless tobacco: Never Used     Comment: Only smoked x 1 year in high school.  . Alcohol Use: Yes     Comment: occasional    Review of Systems  Constitutional: Negative for fever and chills.  Respiratory: Negative for cough and shortness of breath.   Gastrointestinal: Negative for nausea, vomiting and diarrhea.  Musculoskeletal: Negative for myalgias, joint swelling and arthralgias.  Skin: Positive for rash. Negative for wound.    Allergies  Toprol xl; Other; Wheat; Ciprofloxacin; and Zithromax  Home Medications   Prior to Admission medications   Medication Sig Start Date End Date Taking? Authorizing Provider  allopurinol (ZYLOPRIM) 100 MG tablet Take 100 mg by mouth 2 (two) times daily.    Yes Historical Provider, MD  ALPRAZolam Duanne Moron) 0.5 MG tablet Take by mouth 4 (four) times daily. Take one tablet twice a day, takes 2 tablets at bedtime.   Yes Historical Provider, MD  amitriptyline (ELAVIL) 25 MG tablet Take 25 mg by mouth at bedtime.     Yes Historical Provider, MD  atenolol (TENORMIN) 25 MG tablet take 1 tablet by mouth twice a  day 05/01/13  Yes Carlena Bjornstad, MD  silodosin (RAPAFLO) 4 MG CAPS capsule Take 8 mg by mouth daily with breakfast.    Yes Historical Provider, MD  colchicine 0.6 MG tablet Take 0.6 mg by mouth daily. As needed for gout flare.    Historical Provider, MD  indomethacin (INDOCIN) 25 MG capsule If needed for gout    Historical Provider, MD  mupirocin cream (BACTROBAN) 2 % Apply 1 application topically 3 (three) times daily. 02/18/15   Kandra Nicolas, MD  permethrin (ELIMITE) 5 % cream Apply cream from head to feet, leave on for 8-14 hours, then wash with soap/water 05/25/15   Noland Fordyce, PA-C  triamcinolone cream (KENALOG) 0.1 % Apply 1 application topically 2 (two) times daily. 05/25/15   Noland Fordyce, PA-C   Meds Ordered and Administered this Visit  Medications - No data  to display  BP 107/70 mmHg  Pulse 64  Temp(Src) 98 F (36.7 C) (Oral)  Resp 16  Wt 251 lb (113.853 kg)  SpO2 97% No data found.   Physical Exam  Constitutional: He is oriented to person, place, and time. He appears well-developed and well-nourished.  HENT:  Head: Normocephalic and atraumatic.  Eyes: EOM are normal.  Neck: Normal range of motion.  Cardiovascular: Normal rate.   Pulmonary/Chest: Effort normal.  Musculoskeletal: Normal range of motion. He exhibits no tenderness.  Neurological: He is alert and oriented to person, place, and time.  Skin: Skin is warm and dry. Rash noted. No ecchymosis and no petechiae noted. Rash is papular. There is erythema.  Pinpoint sized erythematous papular rash on Left hand close to web spaces, and diffuse on Right cal. Non-tender. No induration or fluctuance. No bleeding or discharge. No red streaking.  Psychiatric: He has a normal mood and affect. His behavior is normal.  Nursing note and vitals reviewed.   ED Course  Procedures (including critical care time)  Labs Review Labs Reviewed - No data to display  Imaging Review No results found.   MDM   1. Rash   2. Insect bites     Pt presenting to [facility] with rash on Left and and Right calf. No evidence of underlying infection. No evidence of anaphylaxis at this time. No oral swelling. No urticaria. Afebrile. HR- 64, BP-107/70 Pt does have hx of being in areas of "lots of insects" and sleeping in an older home.  Will tx for possible scabies.  Rx: permethrin and triamcinolone cream. Home care instructions provided. Extensive discussion with pt of other possible causes. Reassured pt rash is most consist with insect bites. Rash is Not characteristic of a drug rash.  Extremely unlikely allopurinol has caused current rash. Advised to f/u with his PCP in 4-5 days if not improving, sooner if worsening. Strongly encouraged pt to discuss his medications including Allopurinol with is PCP especially  if he decides to stop taking the medication.  Patient verbalized understanding and agreement with treatment plan.     Noland Fordyce, PA-C 05/25/15 1406

## 2015-05-25 NOTE — ED Notes (Signed)
Pt reports small red raised bumps to left hand and finger and right calf x yesterday. Denies itching.

## 2015-09-02 ENCOUNTER — Encounter (HOSPITAL_COMMUNITY): Payer: Self-pay | Admitting: *Deleted

## 2015-09-02 ENCOUNTER — Emergency Department (HOSPITAL_COMMUNITY)
Admission: EM | Admit: 2015-09-02 | Discharge: 2015-09-02 | Disposition: A | Discharge status: Home / Self Care | Payer: Managed Care, Other (non HMO) | Attending: Emergency Medicine | Admitting: Emergency Medicine

## 2015-09-02 DIAGNOSIS — E119 Type 2 diabetes mellitus without complications: Secondary | ICD-10-CM | POA: Insufficient documentation

## 2015-09-02 DIAGNOSIS — F419 Anxiety disorder, unspecified: Secondary | ICD-10-CM | POA: Insufficient documentation

## 2015-09-02 DIAGNOSIS — Z87891 Personal history of nicotine dependence: Secondary | ICD-10-CM | POA: Diagnosis not present

## 2015-09-02 DIAGNOSIS — J45909 Unspecified asthma, uncomplicated: Secondary | ICD-10-CM | POA: Insufficient documentation

## 2015-09-02 DIAGNOSIS — N3289 Other specified disorders of bladder: Secondary | ICD-10-CM

## 2015-09-02 DIAGNOSIS — M109 Gout, unspecified: Secondary | ICD-10-CM | POA: Insufficient documentation

## 2015-09-02 DIAGNOSIS — Z79899 Other long term (current) drug therapy: Secondary | ICD-10-CM | POA: Insufficient documentation

## 2015-09-02 DIAGNOSIS — Z7952 Long term (current) use of systemic steroids: Secondary | ICD-10-CM | POA: Insufficient documentation

## 2015-09-02 DIAGNOSIS — Z8669 Personal history of other diseases of the nervous system and sense organs: Secondary | ICD-10-CM | POA: Diagnosis not present

## 2015-09-02 DIAGNOSIS — E663 Overweight: Secondary | ICD-10-CM | POA: Diagnosis not present

## 2015-09-02 DIAGNOSIS — I10 Essential (primary) hypertension: Secondary | ICD-10-CM | POA: Diagnosis not present

## 2015-09-02 DIAGNOSIS — R339 Retention of urine, unspecified: Secondary | ICD-10-CM | POA: Diagnosis present

## 2015-09-02 HISTORY — DX: Retention of urine, unspecified: R33.9

## 2015-09-02 LAB — I-STAT CHEM 8, ED
BUN: 11 mg/dL (ref 6–20)
CHLORIDE: 105 mmol/L (ref 101–111)
Calcium, Ion: 1.16 mmol/L (ref 1.12–1.23)
Creatinine, Ser: 0.8 mg/dL (ref 0.61–1.24)
GLUCOSE: 102 mg/dL — AB (ref 65–99)
HCT: 48 % (ref 39.0–52.0)
HEMOGLOBIN: 16.3 g/dL (ref 13.0–17.0)
Potassium: 4.3 mmol/L (ref 3.5–5.1)
SODIUM: 141 mmol/L (ref 135–145)
TCO2: 24 mmol/L (ref 0–100)

## 2015-09-02 LAB — URINALYSIS, ROUTINE W REFLEX MICROSCOPIC
Bilirubin Urine: NEGATIVE
GLUCOSE, UA: NEGATIVE mg/dL
HGB URINE DIPSTICK: NEGATIVE
Ketones, ur: NEGATIVE mg/dL
LEUKOCYTES UA: NEGATIVE
Nitrite: NEGATIVE
PROTEIN: NEGATIVE mg/dL
Specific Gravity, Urine: <1.005 — ABNORMAL LOW (ref 1.005–1.030)
pH: 6.5 (ref 5.0–8.0)

## 2015-09-02 NOTE — ED Notes (Signed)
Pt started having trouble producing urine starting yesterday. Pt states he has been able to have 2 episodes of urination since then but feels a pressure of urine in his lower abdomen. Pt has hx of urinary retention related to prostate issues. Pt denies any n/v/d.

## 2015-09-02 NOTE — Discharge Instructions (Signed)
°Emergency Department Resource Guide °1) Find a Doctor and Pay Out of Pocket °Although you won't have to find out who is covered by your insurance plan, it is a good idea to ask around and get recommendations. You will then need to call the office and see if the doctor you have chosen will accept you as a new patient and what types of options they offer for patients who are self-pay. Some doctors offer discounts or will set up payment plans for their patients who do not have insurance, but you will need to ask so you aren't surprised when you get to your appointment. ° °2) Contact Your Local Health Department °Not all health departments have doctors that can see patients for sick visits, but many do, so it is worth a call to see if yours does. If you don't know where your local health department is, you can check in your phone book. The CDC also has a tool to help you locate your state's health department, and many state websites also have listings of all of their local health departments. ° °3) Find a Walk-in Clinic °If your illness is not likely to be very severe or complicated, you may want to try a walk in clinic. These are popping up all over the country in pharmacies, drugstores, and shopping centers. They're usually staffed by nurse practitioners or physician assistants that have been trained to treat common illnesses and complaints. They're usually fairly quick and inexpensive. However, if you have serious medical issues or chronic medical problems, these are probably not your best option. ° °No Primary Care Doctor: °- Call [facility] at  832-8000 - they can help you locate a primary care doctor that  accepts your insurance, provides certain services, etc. °- Physician Referral Service- 1-800-533-3463 ° °Chronic Pain Problems: °Organization         Address  Phone   Notes  °[facility]  (336) 297-2271 Patients need to be referred by their primary care doctor.  ° °Medication  Assistance: °Organization         Address  Phone   Notes  °[place] [facility] [address] °[place], [place] [address] (336) 641-8030 --Must be a resident of [place] °-- Must have NO insurance coverage whatsoever (no Medicaid/ Medicare, etc.) °-- The pt. MUST have a primary care doctor that directs their care regularly and follows them in the community °  °[facility]  (866) 331-1348   °[employer]  (888) 892-1162   ° °Agencies that provide inexpensive medical care: °Organization         Address  Phone   Notes  °[facility]  (336) 832-8035   °[facility]    (336) 832-7272   °[facility] [address] °[place], [place] [address] (336) 832-4777   °[facility] [place] [address], °[place] (336) 271-4999   °[facility]    (336) 373-0678   °[facility]    (336) 272-1050   °[facility] ° [address], [place] Phone:  (336) 832-4444, Fax:  (336) 832-4440 Hours of Operation:  9 am - 6 pm, M-F.  Also accepts Medicaid/Medicare and self-pay.  °[facility] ° [address],  Phone: (336) 832-3150, Fax: (336) [phone]. Hours of Operation:  8:30 am - 5:30 pm, M-F.  Also accepts Medicaid and self-pay.  °[facility] [place] [address], [place] Phone: (336) 878-6027   °[facility] [address], [place], [place] (336)723-1848, Ext. 123 Mondays & Thursdays: 7-9 AM.  First 15 patients are seen on a first come, first serve basis. °  ° °Medicaid-accepting [place] Providers: ° °Organization         Address  Phone   Notes  °[facility] [address], [place] (336) 641-2100 Also accepts self-pay patients.  °[facility] [address], [place] ° (336) 856-9996   °[facility] [address], [place]  (336) 288-8857   °[facility] [address] [place] [address], [place] (336) 299-7000   °Veita Bland [address], [place]  ° (336) 373-1557 Only accepts [place] Access Medicaid patients after they have their name applied to their card.  ° °Self-Pay (no insurance) in [place]: ° °Organization         Address  Phone   Notes  °Sickle Cell Patients, [facility] [address], [place] (336) 832-1970   °[facility] [address], [place] (336) 832-4400   °[facility] ° [address], [place] (336) 992-4800   °[facility]/Dr. Osei-Bonsu ° [address] [place] [address], [place] or [address], [place] (336) [phone] Phone number for both [place] and [place] locations is the same.  °[facility] [address], [place] (336) 299-0000   °[facility] [address] [place] [address], [place] or [address] (336) 852-7530 °(336) 878-2260   °[facility] [address], [place] (336) 350-1642, phone; (336) 294-5005, fax Sees patients 1st and 3rd Saturday of every month.  Must not qualify for public or private insurance (i.e. Medicaid, Medicare, [place] Health Choice, Veterans' Benefits) • Household income should be no more than 200% of the poverty level •The clinic cannot treat you if you are pregnant or think you are pregnant • Sexually transmitted diseases are not treated at the clinic.  ° ° °Dental Care: °Organization         Address  Phone  Notes  °[place] [facility] [address], [place] (336) 641-6152 Accepts children up to age 21 who are enrolled in Medicaid or [place] Health Choice; pregnant women with a Medicaid card; and children who have applied for Medicaid or [place] Health Choice, but were declined, whose parents can pay a reduced fee at time of service.  °[place]  [facility] [place]  [address], [place] (336) 641-7733 Accepts children up to age 21 who are enrolled in Medicaid or [place] Health Choice; pregnant women with a Medicaid card; and children who have applied for Medicaid or [place] Health Choice, but were declined, whose parents can pay a reduced fee at time of service.  °[facility] ° [address], [place] (336) 641-4533 Patients are seen by appointment only. Walk-ins are not accepted. [facility] will see patients 18 years of age and older. °Monday - Tuesday (8am-5pm) °Most Wednesdays (8:30-5pm) °$30 per visit, cash only  °[facility] ° [address], [place] (336) 641-4533 Patients are seen by appointment only. Walk-ins are not accepted. [facility] will see patients 18 years of age and older. °One   Wednesday Evening (Monthly: Volunteer Based).  $30 per visit, cash only  °[facility]  (919) 537-3737 for adults; Children under age 4, call Graduate Pediatric Dentistry at (919) [phone]. Children aged 4-14, please call ([phone]) [phone] to request a pediatric application. ° Dental services are provided in all areas of dental care including fillings, crowns and bridges, complete and partial dentures, implants, gum treatment, root canals, and extractions. Preventive care is also provided. Treatment is provided to both adults and children. °Patients are selected via a lottery and there is often a waiting list. °  °[facility] [address], °[place] ° (336) 763-8833 [email or web address] °  °[facility] [address], [place], [place] (336)723-1848, Ext. 123 Second and Fourth Thursday of each month, opens at 6:30 AM; Clinic ends at 9 AM.  Patients are seen on a first-come first-served basis, and a limited number are seen during each clinic.  ° °[facility] ° [address], [place], [place] (336) 723-7904    Eligibility Requirements °You must have lived in [place], [place], or [place] counties for at least the last three months. °  You cannot be eligible for state or federal sponsored healthcare insurance, including Veterans Administration, Medicaid, or Medicare. °  You generally cannot be eligible for healthcare insurance through your employer.  °  How to apply: °Eligibility screenings are held every Tuesday and Wednesday afternoon from 1:00 pm until 4:00 pm. You do not need an appointment for the interview!  °[facility] [address], [place], [place] 336-631-2330   °[facility]  336-342-8273   °[facility]  336-703-3100   °[facility]  336-570-6415   ° °Behavioral Health Resources in the Community: °Intensive Outpatient Programs °Organization         Address  Phone  Notes  °[facility] [address], [place], [place] 336-878-6098   °[facility] [address], [place], [place] 336-832-9800   °[facility] [address], [place], [place] ° 336-882-2125   °[facility] [address],  °[place], [place] 1-800-853-5163 or 336-641-4981   °Substance Abuse Resources °Organization         Address  Phone  Notes  °[facility]  336-882-2125   °[facility]  336-784-9470   °[facility]  336-285-9073   °[facility]  336-845-3988   °Residential & Outpatient Substance Abuse Program  1-800-659-3381   °Psychological Services °Organization         Address  Phone  Notes  °[facility]  336- 832-9600   °[facility]  336- 378-7881   °[facility] [address], [place] 1-800-853-5163 or 336-641-4981   ° °Mobile Crisis Teams °Organization         Address  Phone  Notes  °[facility], Mobile Crisis Care Unit  1-877-626-1772   °Assertive °[facility] ° [address]  [place], [place] 336-834-9664   °Sharon DeEsch [address] °[place] 336-554-5454   ° °Self-Help/Support Groups °Organization         Address  Phone             Notes  °[facility] - variety of support groups  336- 373-1402 Call for more information  °Narcotics Anonymous (NA), [facility] [address], °[place]  2 meetings at this location  ° °  Residential Treatment Programs Organization         Address  Phone  Notes  [facility] [address]    [place]  1-915-380-1850   [facility]  [address] [place] [id number], [place], [place]   [place], [place] [phone] Admissions: 8am-3pm M-F  [facility] [address]    [place], [place] X4321937   [facility] [address] [place], [place], [place]   [facility] [address],  [place], [place]   [facility] - Intensive Outpatient [address], [place], [place]   [facility]  [place], [place] 1-(716)600-4034 or 203-393-6158   [facility] [address], [place], [place] Accepts Medicaid  [facility] [address]  [place] 1-913-363-7314 Substance Abuse/Addiction Treatment   [facility] Organization         Address  Phone  Notes  [facility]  4170225005   Domenic Schwab, PhD [address] [place], [place]   229-093-3792 or (401)[phone]   [facility] [place], [place] (660)[phone]   [facility] [address], [place], [place] 409-835-5264 Insurance/Medicaid/sponsorship through [facility] [address]                                    [place], [place] [phone] [place] [address], [place] [phone]    Dr. Adele Schilder  [phone]   [facility] Dept. 1) [address] [place] 2) [place] 3)  [address], [place] [phone]  [phone]   [place] (319) [phone] or ([phone] (After Hours)      Take your usual prescriptions as previously directed.  Call your regular Urologist on Tuesday to schedule a follow up appointment this week.  Return to the Emergency Department immediately sooner if worsening.

## 2015-09-02 NOTE — ED Provider Notes (Signed)
CSN: [id number]     Arrival date & time 09/02/15  1305 History   First MD Initiated Contact with Patient 09/02/15 1317     Chief Complaint  Patient presents with  . Urinary Retention      HPI Pt was seen at 1320. Per pt, c/o gradual onset and persistence of multiple intermittent episodes of "trouble urinating" since yesterday. Pt states he feels "pressure" in his lower abd, but has been able to empty is bladder several times without difficulty. Pt states he is concerned he is "retaining urine."  Denies dysuria/hematuria, no testicular pain/swelling, no back/flank pain, no CP/SOB, no fevers.   Uro: [place] Past Medical History  Diagnosis Date  . Sleep apnea     Dr. Halford Chessman  . Ejection fraction     EF 60%, echo, June, 2012  . Gout   . Anxiety   . Palpitations     30 day event recorder, 2006, negative  . Tobacco abuse   . Diabetes mellitus   . Chest pain     Nuclear, 2006, normal,  /   stress echo, June, 2012, normal, EF 60%  . Asthma   . Diverticular disease   . Overweight(278.02)   . Fatigue     Chronic  . Tachycardia     January, 2013  . Hypertension   . Urinary retention    History reviewed. No pertinent past surgical history.   Family History  Problem Relation Age of Onset  . Hypertension Mother    Social History  Substance Use Topics  . Smoking status: Former Research scientist (life sciences)  . Smokeless tobacco: Never Used     Comment: Only smoked x 1 year in high school.  . Alcohol Use: Yes     Comment: occasional    Review of Systems ROS: Statement: All systems negative except as marked or noted in the HPI; Constitutional: Negative for fever and chills. ; ; Eyes: Negative for eye pain, redness and discharge. ; ; ENMT: Negative for ear pain, hoarseness, nasal congestion, sinus pressure and sore throat. ; ; Cardiovascular: Negative for chest pain, palpitations, diaphoresis, dyspnea and peripheral edema. ; ; Respiratory: Negative for cough, wheezing and stridor. ; ; Gastrointestinal:  Negative for nausea, vomiting, diarrhea, abdominal pain, blood in stool, hematemesis, jaundice and rectal bleeding. . ; ; Genitourinary: +"trouble urinating." Negative for dysuria, flank pain and hematuria. ; ; Genital:  No penile drainage or rash, no testicular pain or swelling, no scrotal rash or swelling. ;; Musculoskeletal: Negative for back pain and neck pain. Negative for swelling and trauma.; ; Skin: Negative for pruritus, rash, abrasions, blisters, bruising and skin lesion.; ; Neuro: Negative for headache, lightheadedness and neck stiffness. Negative for weakness, altered level of consciousness , altered mental status, extremity weakness, paresthesias, involuntary movement, seizure and syncope.; Psych:  +anxiety. No SI, no SA, no HI, no hallucinations.       Allergies  Toprol xl; Other; Wheat; Ciprofloxacin; and Zithromax  Home Medications   Prior to Admission medications   Medication Sig Start Date End Date Taking? Authorizing Provider  allopurinol (ZYLOPRIM) 100 MG tablet Take 100 mg by mouth 2 (two) times daily.     Historical Provider, MD  ALPRAZolam Duanne Moron) 0.5 MG tablet Take by mouth 4 (four) times daily. Take one tablet twice a day, takes 2 tablets at bedtime.    Historical Provider, MD  amitriptyline (ELAVIL) 25 MG tablet Take 25 mg by mouth at bedtime.      Historical Provider, MD  atenolol (TENORMIN)  25 MG tablet take 1 tablet by mouth twice a day 05/01/13   Carlena Bjornstad, MD  colchicine 0.6 MG tablet Take 0.6 mg by mouth daily. As needed for gout flare.    Historical Provider, MD  indomethacin (INDOCIN) 25 MG capsule If needed for gout    Historical Provider, MD  mupirocin cream (BACTROBAN) 2 % Apply 1 application topically 3 (three) times daily. 02/18/15   Kandra Nicolas, MD  permethrin (ELIMITE) 5 % cream Apply cream from head to feet, leave on for 8-14 hours, then wash with soap/water 05/25/15   Noland Fordyce, PA-C  silodosin (RAPAFLO) 4 MG CAPS capsule Take 8 mg by mouth  daily with breakfast.     Historical Provider, MD  triamcinolone cream (KENALOG) 0.1 % Apply 1 application topically 2 (two) times daily. 05/25/15   Noland Fordyce, PA-C   BP 129/64 mmHg  Pulse 66  Temp(Src) 97.8 F (36.6 C) (Oral)  Resp 16  Ht 5\' 8"  (1.727 m)  Wt 256 lb (116.121 kg)  BMI 38.93 kg/m2  SpO2 99% Physical Exam 1325: Physical examination:  Nursing notes reviewed; Vital signs and O2 SAT reviewed;  Constitutional: Well developed, Well nourished, Well hydrated, In no acute distress; Head:  Normocephalic, atraumatic; Eyes: EOMI, PERRL, No scleral icterus; ENMT: Mouth and pharynx normal, Mucous membranes moist; Neck: Supple, Full range of motion, No lymphadenopathy; Cardiovascular: Regular rate and rhythm, No gallop; Respiratory: Breath sounds clear & equal bilaterally, No wheezes.  Speaking full sentences with ease, Normal respiratory effort/excursion; Chest: Nontender, Movement normal; Abdomen: Soft, Nontender, Nondistended, Normal bowel sounds; Genitourinary: No CVA tenderness; Extremities: Pulses normal, No tenderness, No edema, No calf edema or asymmetry.; Neuro: AA&Ox3, Major CN grossly intact.  Speech clear. No gross focal motor or sensory deficits in extremities. Climbs on and off stretcher easily by himself. Gait steady.; Skin: Color normal, Warm, Dry.; Psych:  Anxious.   ED Course  Procedures (including critical care time) Labs Review Imaging Review  I have personally reviewed and evaluated these images and lab results as part of my medical decision-making.   EKG Interpretation None      MDM  MDM Reviewed: previous chart, nursing note and vitals Reviewed previous: labs and CT scan Interpretation: labs     [phone]   Attending MD:  Barbarann Ehlers [phone]   Ordering MD:  Barbarann Ehlers Date of Birth:  May 31, 1956    Sex: M Admit Date:  08/30/2015 14:35    ###FINAL RESULT###     INDICATIONS: LEFT LOWER QUADRANT PAIN LEFT INGUINAL PAIN POSSIBLE HERNIA COMMENTS:      PROCEDURE:  QCT 0159- CT PELVIS WO CONT - Aug 30 2015   [facility] Accession #: [id number] [place] Accession #: [id number] TECHNIQUE: CT pelvis. Zero cc Isovue-370. Radiation dose reduction was utilized, (automated exposure control, mA or kV adjustment based on patient size, or interative image reconstruction).  Comparison: None.  INDICATION:Left lower quadrant pain. Left inguinal pain.  FINDINGS: Pelvis: #  Ureters: Distal ureters are normal.. #  Urinary Bladder: Normal. #  Reproductive: No free fluid or pelvic mass. Normal prostate and seminal vesicles. #  Vessels:Trace iliac atherosclerosis. No evidence of aneurysms. #  Lymph nodes:No pelvic or inguinal adenopathy.. #  [id number] caliber is normal. No evidence of appendicitis, diverticulitis or bowel obstruction. Normal appendix #  MSK:  No acute or aggressive lesions.. Tachycardia and disc at L4-5.  Mild diverticulosis. No evidence of abdominal or inguinal hernia.     IMPRESSION:  Essentially normal study without  suggested etiology for clinical symptoms. Mild degenerative disc disease at L4-5.    ___________________________________________________________ Current Report Read by:  Barrie Folk  [phone] on Aug 30 2015  3:22P Transcribed byJari Pigg   on Aug 30 2015  3:26P Electronically Signed by:  DR. Barrie Folk on:  Aug 30 2015  3:26P   Results for orders placed or performed during the hospital encounter of 09/02/15  Urinalysis, Routine w reflex microscopic  Result Value Ref Range   Color, Urine YELLOW YELLOW   APPearance CLEAR CLEAR   Specific Gravity, Urine <1.005 (L) 1.005 - 1.030   pH 6.5 5.0 - 8.0   Glucose, UA NEGATIVE NEGATIVE mg/dL   Hgb urine dipstick NEGATIVE NEGATIVE   Bilirubin Urine NEGATIVE NEGATIVE   Ketones, ur NEGATIVE NEGATIVE mg/dL   Protein, ur NEGATIVE NEGATIVE mg/dL   Nitrite NEGATIVE NEGATIVE   Leukocytes, UA NEGATIVE NEGATIVE  I-stat Chem 8, ED  Result Value Ref Range   Sodium 141 135 - 145 mmol/L    Potassium 4.3 3.5 - 5.1 mmol/L   Chloride 105 101 - 111 mmol/L   BUN 11 6 - 20 mg/dL   Creatinine, Ser 0.80 0.61 - 1.24 mg/dL   Glucose, Bld 102 (H) 65 - 99 mg/dL   Calcium, Ion 1.16 1.12 - 1.23 mmol/L   TCO2 24 0 - 100 mmol/L   Hemoglobin 16.3 13.0 - 17.0 g/dL   HCT 48.0 39.0 - 52.0 %     1355:  Bladder scan after voiding spontaneously was 84ml. ED RN states pt told her that he was "worried about my kidneys after having a CT scan a few days ago." CT scan from 3 days ago at [facility] of[facility] was without IV contrast (results above). Workup in progress.   1455:  Workup reassuring, d/w pt. Pt now states he "has been under a great amount of anxiety" and queries if this could contribute to his symptoms. Long d/w pt regarding same. Pt feels improved and would like to go home now. Dx and testing d/w pt.  Questions answered.  Verb understanding, agreeable to d/c home with outpt f/u.    Francine Graven, DO 09/05/15 2204

## 2015-09-02 NOTE — ED Notes (Signed)
Bladder scanned 5 times.  Less than 50cc every time.

## 2015-09-04 LAB — URINE CULTURE: Culture: 1000

## 2015-11-06 ENCOUNTER — Ambulatory Visit (INDEPENDENT_AMBULATORY_CARE_PROVIDER_SITE_OTHER): Payer: 59 | Admitting: Gastroenterology

## 2015-11-06 ENCOUNTER — Encounter: Payer: Self-pay | Admitting: Gastroenterology

## 2015-11-06 VITALS — BP 118/66 | HR 64 | Ht 68.0 in | Wt 257.4 lb

## 2015-11-06 DIAGNOSIS — K5730 Diverticulosis of large intestine without perforation or abscess without bleeding: Secondary | ICD-10-CM

## 2015-11-06 DIAGNOSIS — K5900 Constipation, unspecified: Secondary | ICD-10-CM | POA: Diagnosis not present

## 2015-11-06 DIAGNOSIS — K921 Melena: Secondary | ICD-10-CM

## 2015-11-06 MED ORDER — NA SULFATE-K SULFATE-MG SULF 17.5-3.13-1.6 GM/177ML PO SOLN: 1.0000 | Freq: Once | ORAL | Status: DC

## 2015-11-06 NOTE — Patient Instructions (Addendum)
You have been given a High Fiber diet brochure.  You can take over the counter Miralax mixing 17 grams in 8 oz of water every other day or daily for constipation.  You have been scheduled for a colonoscopy. Please follow written instructions given to you at your visit today.  Please pick up your prep supplies at the pharmacy within the next 1-3 days. If you use inhalers (even only as needed), please bring them with you on the day of your procedure. Your physician has requested that you go to www.startemmi.com and enter the access code given to you at your visit today. This web site gives a general overview about your procedure. However, you should still follow specific instructions given to you by our office regarding your preparation for the procedure.  Thank you for choosing me and [facility].  Pricilla Riffle. Dagoberto Ligas., MD., Marval Regal   cc: [employer], MD

## 2015-11-06 NOTE — Progress Notes (Signed)
    History of Present Illness: This is a 60 year old male referred by Redmond School, MD for the evaluation of constipation for several months. He relates needing laxatives on several occasions. His stools generally alternate between normal and constipated. He has noted small amounts of bright red blood per rectum after wiping following constipated stools. He has increased his fiber intake which has helped somewhat. CT scan performed with a low findings. Patient was very concerned and anxious that he had diverticulitis. Denies weight loss, abdominal pain, diarrhea, change in stool caliber, melena, nausea, vomiting, dysphagia, reflux symptoms, chest pain.    FINDINGS: CT Pelvis 09/02/15: # Ureters: Distal ureters are normal.. # Urinary Bladder: Normal. # Reproductive: No free fluid or pelvic mass. Normal prostate and seminal vesicles. # Vessels:Trace iliac atherosclerosis. No evidence of aneurysms. # Lymph nodes:No pelvic or inguinal adenopathy.. # [id number] caliber is normal. No evidence of appendicitis, diverticulitis or bowel obstruction. Normal appendix # MSK: No acute or aggressive lesions.. Tachycardia and disc at L4-5. Mild diverticulosis. No evidence of abdominal or inguinal hernia.   IMPRESSION: Essentially normal study without suggested etiology for clinical symptoms. Mild degenerative disc disease at L4-5.    Review of Systems: Pertinent positive and negative review of systems were noted in the above HPI section. All other review of systems were otherwise negative.  Current Medications, Allergies, Past Medical History, Past Surgical History, Family History and Social History were reviewed in [employer] record.  Physical Exam: General: Well developed, well nourished, no acute distress Head: Normocephalic and atraumatic Eyes:  sclerae anicteric, EOMI Ears: Normal auditory acuity Mouth: No deformity or lesions Neck: Supple, no masses or  thyromegaly Lungs: Clear throughout to auscultation Heart: Regular rate and rhythm; no murmurs, rubs or bruits Abdomen: Soft, non tender and non distended. No masses, hepatosplenomegaly or hernias noted. Normal Bowel sounds Rectal: deferred to colonoscopy Musculoskeletal: Symmetrical with no gross deformities  Skin: No lesions on visible extremities Pulses:  Normal pulses noted Extremities: No clubbing, cyanosis, edema or deformities noted Neurological: Alert oriented x 4, grossly nonfocal Cervical Nodes:  No significant cervical adenopathy Inguinal Nodes: No significant inguinal adenopathy Psychological:  Alert and cooperative. Very anxious.    Assessment and Recommendations:  1. Constipation for several months with small volume hematochezia. R/O colorectal neoplasms, hemorrhoids, etc. The risks (including bleeding, perforation, infection, missed lesions, medication reactions and possible hospitalization or surgery if complications occur), benefits, and alternatives to colonoscopy with possible biopsy and possible polypectomy were discussed with the patient and they consent to proceed.   2. Mild diverticulosis on CT. Discussed diagnosis and pt reassured.    cc: Redmond School, MD

## 2015-11-10 ENCOUNTER — Telehealth: Payer: Self-pay | Admitting: Gastroenterology

## 2015-11-10 ENCOUNTER — Encounter (HOSPITAL_COMMUNITY): Payer: Self-pay | Admitting: Emergency Medicine

## 2015-11-10 ENCOUNTER — Emergency Department (HOSPITAL_COMMUNITY)
Admission: EM | Admit: 2015-11-10 | Discharge: 2015-11-10 | Disposition: A | Discharge status: Home / Self Care | Payer: 59 | Attending: Emergency Medicine | Admitting: Emergency Medicine

## 2015-11-10 DIAGNOSIS — R1030 Lower abdominal pain, unspecified: Secondary | ICD-10-CM | POA: Diagnosis present

## 2015-11-10 DIAGNOSIS — F419 Anxiety disorder, unspecified: Secondary | ICD-10-CM | POA: Insufficient documentation

## 2015-11-10 DIAGNOSIS — M109 Gout, unspecified: Secondary | ICD-10-CM | POA: Diagnosis not present

## 2015-11-10 DIAGNOSIS — Z87891 Personal history of nicotine dependence: Secondary | ICD-10-CM | POA: Insufficient documentation

## 2015-11-10 DIAGNOSIS — E663 Overweight: Secondary | ICD-10-CM | POA: Insufficient documentation

## 2015-11-10 DIAGNOSIS — J45909 Unspecified asthma, uncomplicated: Secondary | ICD-10-CM | POA: Insufficient documentation

## 2015-11-10 DIAGNOSIS — I10 Essential (primary) hypertension: Secondary | ICD-10-CM | POA: Diagnosis not present

## 2015-11-10 DIAGNOSIS — K5900 Constipation, unspecified: Secondary | ICD-10-CM | POA: Insufficient documentation

## 2015-11-10 DIAGNOSIS — E119 Type 2 diabetes mellitus without complications: Secondary | ICD-10-CM | POA: Diagnosis not present

## 2015-11-10 DIAGNOSIS — Z79899 Other long term (current) drug therapy: Secondary | ICD-10-CM | POA: Insufficient documentation

## 2015-11-10 LAB — CBC WITH DIFFERENTIAL/PLATELET
BASOS PCT: 1 %
Basophils Absolute: 0 10*3/uL (ref 0.0–0.1)
EOS PCT: 2 %
Eosinophils Absolute: 0.1 10*3/uL (ref 0.0–0.7)
HEMATOCRIT: 43.8 % (ref 39.0–52.0)
Hemoglobin: 15.6 g/dL (ref 13.0–17.0)
Lymphocytes Relative: 23 %
Lymphs Abs: 1.7 10*3/uL (ref 0.7–4.0)
MCH: 32.7 pg (ref 26.0–34.0)
MCHC: 35.6 g/dL (ref 30.0–36.0)
MCV: 91.8 fL (ref 78.0–100.0)
MONO ABS: 0.7 10*3/uL (ref 0.1–1.0)
MONOS PCT: 9 %
NEUTROS ABS: 4.9 10*3/uL (ref 1.7–7.7)
Neutrophils Relative %: 65 %
PLATELETS: 223 10*3/uL (ref 150–400)
RBC: 4.77 MIL/uL (ref 4.22–5.81)
RDW: 12.7 % (ref 11.5–15.5)
WBC: 7.5 10*3/uL (ref 4.0–10.5)

## 2015-11-10 LAB — COMPREHENSIVE METABOLIC PANEL
ALBUMIN: 4.1 g/dL (ref 3.5–5.0)
ALK PHOS: 75 U/L (ref 38–126)
ALT: 33 U/L (ref 17–63)
ANION GAP: 12 (ref 5–15)
AST: 27 U/L (ref 15–41)
BILIRUBIN TOTAL: 1.6 mg/dL — AB (ref 0.3–1.2)
BUN: 9 mg/dL (ref 6–20)
CALCIUM: 9.7 mg/dL (ref 8.9–10.3)
CO2: 25 mmol/L (ref 22–32)
Chloride: 103 mmol/L (ref 101–111)
Creatinine, Ser: 0.99 mg/dL (ref 0.61–1.24)
GFR calc Af Amer: >60 mL/min (ref >60)
GFR calc non Af Amer: >60 mL/min (ref >60)
GLUCOSE: 109 mg/dL — AB (ref 65–99)
Potassium: 4.4 mmol/L (ref 3.5–5.1)
SODIUM: 140 mmol/L (ref 135–145)
Total Protein: 7.4 g/dL (ref 6.5–8.1)

## 2015-11-10 LAB — URINALYSIS, ROUTINE W REFLEX MICROSCOPIC
BILIRUBIN URINE: NEGATIVE
GLUCOSE, UA: NEGATIVE mg/dL
Hgb urine dipstick: NEGATIVE
KETONES UR: NEGATIVE mg/dL
Leukocytes, UA: NEGATIVE
NITRITE: NEGATIVE
PH: 6 (ref 5.0–8.0)
Protein, ur: NEGATIVE mg/dL
Specific Gravity, Urine: 1.01 (ref 1.005–1.030)

## 2015-11-10 MED ORDER — FLEET ENEMA 7-19 GM/118ML RE ENEM
1.0000 | ENEMA | Freq: Once | RECTAL | Status: AC
  Administered 2015-11-10: 1 via RECTAL
  Filled 2015-11-10: qty 1

## 2015-11-10 MED ORDER — POLYETHYLENE GLYCOL 3350 17 G PO PACK: 17.0000 g | PACK | Freq: Every day | ORAL | Status: DC

## 2015-11-10 NOTE — Discharge Instructions (Signed)

## 2015-11-10 NOTE — ED Notes (Signed)
Pt with one episode of formed stool.  MD notified.

## 2015-11-10 NOTE — ED Provider Notes (Signed)
CSN: [id number]     Arrival date & time 11/10/15  0703 History   First MD Initiated Contact with Patient 11/10/15 [phone]     Chief Complaint  Patient presents with  . Abdominal Pain     The history is provided by the patient. No language interpreter was used.   Craig [name] is a 60 y.o. male who presents to the Emergency Department complaining of lower abdominal distension.  He reports 2 months of intermittent constipation. Last BM was yesterday but seemed like it was small and he had to strain some. He reports since yesterday he has increased lower abdominal distention and bloating type sensation. He denies any frank pain. No fevers, vomiting, nausea. No dysuria. No prior abdominal surgeries. He was seen by gastroenterology 5 days ago and has an outpatient colonoscopy scheduled for a month from now. He was seen in urgent care 2 months ago for similar symptoms and had a CT of his pelvis that showed diverticulosis no diverticulitis.  Past Medical History  Diagnosis Date  . Sleep apnea     Dr. Halford Chessman  . Ejection fraction     EF 60%, echo, June, 2012  . Gout   . Anxiety   . Palpitations     30 day event recorder, 2006, negative  . Tobacco abuse     former  . Diabetes mellitus   . Chest pain     Nuclear, 2006, normal, / stress echo, June, 2012, normal, EF 60%  . Asthma   . Diverticular disease   . Overweight(278.02)   . Fatigue     Chronic  . Tachycardia     January, 2013  . Hypertension   . Urinary retention   . Hyperlipemia    Past Surgical History  Procedure Laterality Date  . Shoulder surgery      high school   Family History  Problem Relation Age of Onset  . Hypertension Mother   . Diverticulosis Sister   . Diverticulosis Maternal Grandfather    Social History  Substance Use Topics  . Smoking status: Former Research scientist (life sciences)  . Smokeless tobacco: Never Used     Comment: Only smoked x 1 year in high school.  . Alcohol Use: No    Review of Systems  All other systems  reviewed and are negative.     Allergies  Toprol xl; Other; Wheat; Ciprofloxacin; and Zithromax  Home Medications   Prior to Admission medications   Medication Sig Start Date End Date Taking? Authorizing Provider  allopurinol (ZYLOPRIM) 100 MG tablet Take 100 mg by mouth daily.    Yes Historical Provider, MD  ALPRAZolam Duanne Moron) 0.5 MG tablet Take 0.5 mg by mouth 4 (four) times daily as needed for anxiety.    Yes Historical Provider, MD  amitriptyline (ELAVIL) 25 MG tablet Take 25 mg by mouth at bedtime.     Yes Historical Provider, MD  atenolol (TENORMIN) 25 MG tablet take 1 tablet by mouth twice a day Patient taking differently: take 1 tablet by mouth daily. 05/01/13  Yes Carlena Bjornstad, MD  colchicine 0.6 MG tablet Take 0.6 mg by mouth daily. As needed for gout flare.   Yes Historical Provider, MD  Docusate Calcium (STOOL SOFTENER PO) Take 1-2 tablets by mouth daily as needed (constipation).    Yes Historical Provider, MD  indomethacin (INDOCIN) 25 MG capsule Take 25 mg by mouth daily as needed for mild pain. If needed for gout   Yes Historical Provider, MD  silodosin (RAPAFLO)  4 MG CAPS capsule Take 8 mg by mouth daily with breakfast.    Yes Historical Provider, MD  polyethylene glycol (MIRALAX) packet Take 17 g by mouth daily. 11/10/15   Quintella Reichert, MD   BP 123/80 mmHg  Pulse 55  Temp(Src) 97.1 F (36.2 C) (Oral)  Resp 16  SpO2 97% Physical Exam  Constitutional: He is oriented to person, place, and time. He appears well-developed and well-nourished.  HENT:  Head: Normocephalic and atraumatic.  Cardiovascular: Normal rate and regular rhythm.   No murmur heard. Pulmonary/Chest: Effort normal and breath sounds normal. No respiratory distress.  Abdominal: Soft. There is no tenderness. There is no rebound and no guarding.  Genitourinary:  nontender rectal exam, brown stool, no fecal impaction.  In inguinal hernia.   Musculoskeletal: He exhibits no edema or tenderness.   Neurological: He is alert and oriented to person, place, and time.  Skin: Skin is warm and dry.  Psychiatric: He has a normal mood and affect. His behavior is normal.  Nursing note and vitals reviewed.   ED Course  Procedures (including critical care time) Labs Review Labs Reviewed  COMPREHENSIVE METABOLIC PANEL - Abnormal; Notable for the following:    Glucose, Bld 109 (*)    Total Bilirubin 1.6 (*)    All other components within normal limits  CBC WITH DIFFERENTIAL/PLATELET  URINALYSIS, ROUTINE W REFLEX MICROSCOPIC (NOT AT [facility])    Imaging Review No results found. I have personally reviewed and evaluated these images and lab results as part of my medical decision-making.   EKG Interpretation None      MDM   Final diagnoses:  Constipation, unspecified constipation type    Patient with history of constipation over the last 2 months here for recurrent constipation for one day. He had a fleets enema in the emergency department with relief of his symptoms. He has a benign abdominal examination. Presentation is not consistent with bowel obstruction, incarcerated hernia, appendicitis. Discussed with patient home care for constipation, palpation follow-up, return precautions.    Quintella Reichert, MD 11/10/15 [phone]

## 2015-11-10 NOTE — Telephone Encounter (Signed)
Patient went to the ED today for constipation.  He did not take Miralax as recommended at the office visit on 11/06/15. He is scheduled for a colonoscopy for 12/19/15.    Patient was given a Fleet enema in the ED.  Patient would like to come into the office and further discuss his constipation.  I advised the patient that I will place him on the cancellation list for an earlier possible colonoscopy date.  I advised that he should start Miralax 1-3 times a day, remain on a high fiber diet, and make sure he has 6-8 glasses of fluids a day.

## 2015-11-10 NOTE — ED Notes (Signed)
Pt sts abd pain and distention; pt sts hx of constipation issues x 2 months

## 2015-12-19 ENCOUNTER — Encounter: Payer: 59 | Admitting: Gastroenterology
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
## 2016-06-05 ENCOUNTER — Encounter (INDEPENDENT_AMBULATORY_CARE_PROVIDER_SITE_OTHER): Payer: Self-pay

## 2016-06-05 ENCOUNTER — Encounter: Payer: Self-pay | Admitting: Physician Assistant

## 2016-06-05 ENCOUNTER — Ambulatory Visit (INDEPENDENT_AMBULATORY_CARE_PROVIDER_SITE_OTHER): Payer: 59 | Admitting: Physician Assistant

## 2016-06-05 VITALS — BP 110/76 | HR 64 | Ht 68.0 in | Wt 269.8 lb

## 2016-06-05 DIAGNOSIS — R002 Palpitations: Secondary | ICD-10-CM

## 2016-06-05 DIAGNOSIS — R5383 Other fatigue: Secondary | ICD-10-CM

## 2016-06-05 DIAGNOSIS — R6889 Other general symptoms and signs: Secondary | ICD-10-CM

## 2016-06-05 DIAGNOSIS — R55 Syncope and collapse: Secondary | ICD-10-CM

## 2016-06-05 NOTE — Patient Instructions (Addendum)
Medication Instructions:  Your physician recommends that you continue on your current medications as directed. Please refer to the Current Medication list given to you today.  Labwork: NONE  Testing/Procedures: Your physician has requested that you have en exercise stress myoview. For further information please visit [employer]uge[employer]iesta[employer][email or web address]. Please follow instruction sheet, as given.  Your physician has recommended that you wear an event monitor. Event monitors are medical devices that record the heart's electrical activity. Doctors most often [place] these monitors to diagnose arrhythmias. Arrhythmias are problems with the speed or rhythm of the heartbeat. The monitor is a small, portable device. You can wear one while you do your normal daily activities. This is usually used to diagnose what is causing palpitations/syncope (passing out).  Follow-Up: DR. Stanford Breed IN 2 MONTHS IN THE  OFFICE  Any Other Special Instructions Will Be Listed Below (If Applicable). If you need a refill on your cardiac medications before your next appointment, please call your pharmacy.

## 2016-06-10 ENCOUNTER — Telehealth (HOSPITAL_COMMUNITY): Payer: Self-pay | Admitting: *Deleted

## 2016-06-10 NOTE — Telephone Encounter (Signed)
Left message on voicemail in reference to upcoming appointment scheduled for  06/13/16. Phone number given for a call back so details instructions can be given.  Craig [name]

## 2016-06-13 ENCOUNTER — Encounter (HOSPITAL_COMMUNITY): Payer: 59

## 2016-06-27 ENCOUNTER — Ambulatory Visit: Payer: 59 | Admitting: Cardiology

## 2016-07-06 ENCOUNTER — Encounter (HOSPITAL_COMMUNITY): Payer: Self-pay

## 2016-07-06 ENCOUNTER — Emergency Department (HOSPITAL_COMMUNITY)
Admission: EM | Admit: 2016-07-06 | Discharge: 2016-07-06 | Disposition: A | Discharge status: Home / Self Care | Payer: 59 | Attending: Emergency Medicine | Admitting: Emergency Medicine

## 2016-07-06 DIAGNOSIS — I10 Essential (primary) hypertension: Secondary | ICD-10-CM | POA: Insufficient documentation

## 2016-07-06 DIAGNOSIS — Y999 Unspecified external cause status: Secondary | ICD-10-CM | POA: Insufficient documentation

## 2016-07-06 DIAGNOSIS — Z79899 Other long term (current) drug therapy: Secondary | ICD-10-CM | POA: Diagnosis not present

## 2016-07-06 DIAGNOSIS — E119 Type 2 diabetes mellitus without complications: Secondary | ICD-10-CM | POA: Diagnosis not present

## 2016-07-06 DIAGNOSIS — Z87891 Personal history of nicotine dependence: Secondary | ICD-10-CM | POA: Insufficient documentation

## 2016-07-06 DIAGNOSIS — W57XXXA Bitten or stung by nonvenomous insect and other nonvenomous arthropods, initial encounter: Secondary | ICD-10-CM | POA: Insufficient documentation

## 2016-07-06 DIAGNOSIS — Y939 Activity, unspecified: Secondary | ICD-10-CM | POA: Diagnosis not present

## 2016-07-06 DIAGNOSIS — J45909 Unspecified asthma, uncomplicated: Secondary | ICD-10-CM | POA: Diagnosis not present

## 2016-07-06 DIAGNOSIS — Y929 Unspecified place or not applicable: Secondary | ICD-10-CM | POA: Insufficient documentation

## 2016-07-06 DIAGNOSIS — S30860A Insect bite (nonvenomous) of lower back and pelvis, initial encounter: Secondary | ICD-10-CM | POA: Diagnosis not present

## 2016-07-06 MED ORDER — DOXYCYCLINE HYCLATE 100 MG PO TABS
200.0000 mg | ORAL_TABLET | Freq: Once | ORAL | Status: AC
Start: 2016-07-06 — End: 2016-07-06
  Administered 2016-07-06: 200 mg via ORAL
  Filled 2016-07-06: qty 2

## 2016-07-06 MED ORDER — BACITRACIN ZINC 500 UNIT/GM EX OINT
TOPICAL_OINTMENT | Freq: Once | CUTANEOUS | Status: AC
  Administered 2016-07-06: 21:00:00 via TOPICAL
  Filled 2016-07-06: qty 0.9

## 2016-07-06 NOTE — ED Triage Notes (Signed)
Tick bite to left hip.  Tick has an oblong shape and is gray in color.  Redness noted at site of bite.  "This has me anxious" per pt.  Also I had some palpitations the other night and took an extra beta blocker, the tick bite may have something to do with that.  Patient denies palpitations or chest pain at this time.

## 2016-07-06 NOTE — Discharge Instructions (Signed)
You have been seen today for a tick bite and removal. You are not showing signs of a current systemic infection such as [facility] Spotted Fever or Lyme Disease. Current evidence-based recommendations state that care should be limited to local wound care unless there are symptoms of these diseases such as a combination of headache, rashes, fever, or body aches.   Clean the wound and surrounding area gently with tap water and mild soap. Rinse well and blot dry. You may shower, but avoid submerging the wound, such as with a bath or swimming.  Clean the wound daily to prevent infection. Reapplication of a topical antibiotic ointment, such as Neosporin, will decrease scab formation and reduce any scarring. You may use Tylenol, naproxen, ibuprofen for pain. Return to the ED sooner should signs of infection arise, such as spreading redness, puffiness/swelling, pus draining from the wound, severe increase in pain, or any other major issues.

## 2016-07-06 NOTE — ED Provider Notes (Signed)
[facility] DEPT Provider Note   CSN: [id number] Arrival date & time: 07/06/16  2027     History   Chief Complaint Chief Complaint  Patient presents with  . Tick Removal    HPI Craig [name] is a 60 y.o. male.  HPI   Craig [name] is a 60 y.o. male, with a history of DM and HTN, presenting to the ED with Complaint of a tick embedded in the skin of the right gluteal region. Patient states he does not know how long the tick has been there. Also complains of some redness surrounding the tick. Denies fever/chills, body aches, headache, rashes, nausea/vomiting, or any other complaints.      Past Medical History:  Diagnosis Date  . Anxiety   . Asthma   . Chest pain    Nuclear, 2006, normal, / stress echo, June, 2012, normal, EF 60%  . Diabetes mellitus   . Diverticular disease   . Ejection fraction    EF 60%, echo, June, 2012  . Fatigue    Chronic  . Gout   . Hyperlipemia   . Hypertension   . Overweight(278.02)   . Palpitations    30 day event recorder, 2006, negative  . Sleep apnea    Dr. Halford Chessman  . Tachycardia    January, 2013  . Tobacco abuse    former  . Urinary retention     Patient Active Problem List   Diagnosis Date Noted  . Tachycardia   . Ejection fraction   . Palpitations   . Chest pain   . Sleep apnea   . Gout   . Anxiety   . Tobacco abuse   . Diabetes mellitus   . Asthma   . Diverticular disease   . Overweight(278.02)   . Fatigue     Past Surgical History:  Procedure Laterality Date  . SHOULDER SURGERY     high school       Home Medications    Prior to Admission medications   Medication Sig Start Date End Date Taking? Authorizing Provider  allopurinol (ZYLOPRIM) 100 MG tablet Take 100 mg by mouth daily.    Historical Provider, MD  ALPRAZolam Duanne Moron) 0.5 MG tablet Take 0.5 mg by mouth 4 (four) times daily as needed for anxiety.     Historical Provider, MD  amitriptyline (ELAVIL) 25 MG tablet Take 25 mg by mouth at bedtime.       Historical Provider, MD  atenolol (TENORMIN) 25 MG tablet Take 25 mg by mouth daily.    Historical Provider, MD  colchicine 0.6 MG tablet Take 0.6 mg by mouth daily. As needed for gout flare.    Historical Provider, MD  Docusate Calcium (STOOL SOFTENER PO) Take 1-2 tablets by mouth daily as needed (constipation).     Historical Provider, MD  indomethacin (INDOCIN) 25 MG capsule Take 25 mg by mouth daily as needed for mild pain. If needed for gout    Historical Provider, MD  silodosin (RAPAFLO) 4 MG CAPS capsule Take 8 mg by mouth daily with breakfast.     Historical Provider, MD    Family History Family History  Problem Relation Age of Onset  . Hypertension Mother   . Diverticulosis Sister   . Diverticulosis Maternal Grandfather     Social History Social History  Substance Use Topics  . Smoking status: Former Research scientist (life sciences)  . Smokeless tobacco: Never Used     Comment: Only smoked x 1 year in high school.  . Alcohol  use No     Allergies   Toprol xl [metoprolol succinate]; Other; Wheat; Ciprofloxacin; and Zithromax [azithromycin dihydrate]   Review of Systems Review of Systems  Constitutional: Negative for chills and fever.  Gastrointestinal: Negative for nausea and vomiting.  Skin: Positive for color change and wound. Negative for rash.  Neurological: Negative for headaches.     Physical Exam Updated Vital Signs BP 120/74 (BP Location: Left Arm)   Pulse 93   Temp 97.5 F (36.4 C) (Oral)   Resp 18   Ht 5\' 9"  (1.753 m)   Wt 120.2 kg   SpO2 96%   BMI 39.13 kg/m   Physical Exam  Constitutional: He appears well-developed and well-nourished. No distress.  HENT:  Head: Normocephalic and atraumatic.  Eyes: Conjunctivae are normal.  Neck: Neck supple.  Cardiovascular: Normal rate and regular rhythm.   Pulmonary/Chest: Effort normal.  Musculoskeletal: He exhibits no edema.  Neurological: He is alert.  Skin: Skin is warm and dry. No rash noted. He is not diaphoretic.    Small, gray, engorged tick attached to the skin of the left gluteal region. Small area of surrounding erythema.  Psychiatric: He has a normal mood and affect. His behavior is normal.  Nursing note and vitals reviewed.    ED Treatments / Results  Labs (all labs ordered are listed, but only abnormal results are displayed) Labs Reviewed - No data to display  EKG  EKG Interpretation None       Radiology No results found.  Procedures .Foreign Body Removal Date/Time: 07/06/2016 9:11 PM Performed by: Lorayne Bender Authorized by: Lorayne Bender  Consent: Verbal consent obtained. Risks and benefits: risks, benefits and alternatives were discussed Consent given by: patient Patient understanding: patient states understanding of the procedure being performed Patient consent: the patient's understanding of the procedure matches consent given Procedure consent: procedure consent matches procedure scheduled Patient identity confirmed: verbally with patient and arm band Body area: skin General location: lower extremity Location details: left buttock Patient restrained: no Patient cooperative: yes Complexity: simple 1 objects recovered. Objects recovered: Tick Post-procedure assessment: foreign body removed Patient tolerance: Patient tolerated the procedure well with no immediate complications   (including critical care time)  Medications Ordered in ED Medications  doxycycline (VIBRA-TABS) tablet 200 mg (200 mg Oral Given 07/06/16 2126)  bacitracin ointment ( Topical Given 07/06/16 2127)     Initial Impression / Assessment and Plan / ED Course  I have reviewed the triage vital signs and the nursing notes.  Pertinent labs & imaging results that were available during my care of the patient were reviewed by me and considered in my medical decision making (see chart for details).  Clinical Course    Patient presents for a tick removal. He has no symptoms of [facility]  spotted fever or Lyme disease at this time. Tick was removed successfully with no noted residual parts left in the skin. Patient does not meet current recommendation criteria for a course of doxycycline, however, he does meet some criteria for Lyme disease prophylaxis with a single dose of doxycycline. The patient was given instructions for home care as well as return precautions. Patient voices understanding of these instructions, accepts the plan, and is comfortable with discharge.   Vitals:   07/06/16 2034 07/06/16 2035  BP: 120/74   Pulse: 93   Resp: 18   Temp: 97.5 F (36.4 C)   TempSrc: Oral   SpO2: 96%   Weight:  120.2 kg  Height:  5\' 9"  (1.753 m)     Final Clinical Impressions(s) / ED Diagnoses   Final diagnoses:  Tick bite of buttock, initial encounter    New Prescriptions Discharge Medication List as of 07/06/2016  9:19 PM       Lorayne Bender, PA-C 07/07/16 1843    Virgel Manifold, MD 07/08/16 1950

## 2016-07-06 NOTE — ED Notes (Signed)
Pt presents to ED for tick removal that appears dead on his  l gluteal region. He reports that he is unaware of how long the tick has been imbedded in his skn and that he wonders if this RN has ever seen a tick so large. The area around the embedded head of the tick is reddened and dry  Alcohol is laid upon the tick in the event that the tick continues to be alive

## 2016-07-06 NOTE — ED Triage Notes (Signed)
Patient denies fever, headache, rash, or muscle aches.

## 2016-07-25 NOTE — Progress Notes (Deleted)
HPI: FU palpitations; previously followed by Dr Ron Parker.  Pt seen in 2008 for syncope. He had a 3-3.5 sec pause and was seen by Dr. Virl Axe.  He had an event monitor that was unremarkable and has not had a recurrence. Echocardiogram June 2012 showed normal LV function. Stress echocardiogram at that time also normal. Seen recently by Richardson Dopp for exertional fatigue. Nuclear study and event monitor ordered.   Current Outpatient Prescriptions  Medication Sig Dispense Refill  . allopurinol (ZYLOPRIM) 100 MG tablet Take 100 mg by mouth daily.    Marland Kitchen ALPRAZolam (XANAX) 0.5 MG tablet Take 0.5 mg by mouth 4 (four) times daily as needed for anxiety.     Marland Kitchen amitriptyline (ELAVIL) 25 MG tablet Take 25 mg by mouth at bedtime.      Marland Kitchen atenolol (TENORMIN) 25 MG tablet Take 25 mg by mouth daily.    . colchicine 0.6 MG tablet Take 0.6 mg by mouth daily. As needed for gout flare.    Mariane Baumgarten Calcium (STOOL SOFTENER PO) Take 1-2 tablets by mouth daily as needed (constipation).     . indomethacin (INDOCIN) 25 MG capsule Take 25 mg by mouth daily as needed for mild pain. If needed for gout    . silodosin (RAPAFLO) 4 MG CAPS capsule Take 8 mg by mouth daily with breakfast.      No current facility-administered medications for this visit.      Past Medical History:  Diagnosis Date  . Anxiety   . Asthma   . Chest pain    Nuclear, 2006, normal, / stress echo, June, 2012, normal, EF 60%  . Diabetes mellitus   . Diverticular disease   . Ejection fraction    EF 60%, echo, June, 2012  . Fatigue    Chronic  . Gout   . Hyperlipemia   . Hypertension   . Overweight(278.02)   . Palpitations    30 day event recorder, 2006, negative  . Sleep apnea    Dr. Halford Chessman  . Tachycardia    January, 2013  . Tobacco abuse    former  . Urinary retention     Past Surgical History:  Procedure Laterality Date  . SHOULDER SURGERY     high school    Social History   Social History  . Marital status:  Married    Spouse name: N/A  . Number of children: 1  . Years of education: N/A   Occupational History  . Not on file.   Social History Main Topics  . Smoking status: Former Research scientist (life sciences)  . Smokeless tobacco: Never Used     Comment: Only smoked x 1 year in high school.  . Alcohol use No  . Drug use: No  . Sexual activity: Yes   Other Topics Concern  . 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    Family History  Problem Relation Age of Onset  . Hypertension Mother   . Diverticulosis Sister   . Diverticulosis Maternal Grandfather     ROS: no fevers or chills, productive cough, hemoptysis, dysphasia, odynophagia, melena, hematochezia, dysuria, hematuria, rash, seizure activity, orthopnea, PND, pedal edema, claudication. Remaining systems are negative.  Physical Exam: Well-developed well-nourished in no acute distress.  Skin is warm and dry.  HEENT is normal.  Neck is supple.  Chest is clear to auscultation  with normal expansion.  Cardiovascular exam is regular rate and rhythm.  Abdominal exam nontender or distended. No masses palpated. Extremities show no edema. neuro grossly intact  ECG

## 2016-07-31 ENCOUNTER — Ambulatory Visit: Payer: 59 | Admitting: Cardiology

## 2016-09-11 DIAGNOSIS — J0140 Acute pansinusitis, unspecified: Secondary | ICD-10-CM | POA: Diagnosis not present

## 2016-09-11 DIAGNOSIS — J029 Acute pharyngitis, unspecified: Secondary | ICD-10-CM | POA: Diagnosis not present

## 2016-09-21 ENCOUNTER — Emergency Department (HOSPITAL_COMMUNITY)
Admission: EM | Admit: 2016-09-21 | Discharge: 2016-09-21 | Disposition: A | Discharge status: Home / Self Care | Payer: 59 | Attending: Emergency Medicine | Admitting: Emergency Medicine

## 2016-09-21 ENCOUNTER — Encounter (HOSPITAL_COMMUNITY): Payer: Self-pay | Admitting: Emergency Medicine

## 2016-09-21 DIAGNOSIS — I10 Essential (primary) hypertension: Secondary | ICD-10-CM | POA: Diagnosis not present

## 2016-09-21 DIAGNOSIS — N401 Enlarged prostate with lower urinary tract symptoms: Secondary | ICD-10-CM | POA: Diagnosis not present

## 2016-09-21 DIAGNOSIS — J45909 Unspecified asthma, uncomplicated: Secondary | ICD-10-CM | POA: Diagnosis not present

## 2016-09-21 DIAGNOSIS — Z79899 Other long term (current) drug therapy: Secondary | ICD-10-CM | POA: Diagnosis not present

## 2016-09-21 DIAGNOSIS — Z87891 Personal history of nicotine dependence: Secondary | ICD-10-CM | POA: Insufficient documentation

## 2016-09-21 DIAGNOSIS — N400 Enlarged prostate without lower urinary tract symptoms: Secondary | ICD-10-CM | POA: Diagnosis not present

## 2016-09-21 DIAGNOSIS — R339 Retention of urine, unspecified: Secondary | ICD-10-CM | POA: Diagnosis present

## 2016-09-21 DIAGNOSIS — E119 Type 2 diabetes mellitus without complications: Secondary | ICD-10-CM | POA: Insufficient documentation

## 2016-09-21 LAB — URINALYSIS, ROUTINE W REFLEX MICROSCOPIC
Bilirubin Urine: NEGATIVE
Glucose, UA: NEGATIVE mg/dL
Hgb urine dipstick: NEGATIVE
Ketones, ur: NEGATIVE mg/dL
LEUKOCYTES UA: NEGATIVE
Nitrite: NEGATIVE
PROTEIN: NEGATIVE mg/dL
Specific Gravity, Urine: 1.011 (ref 1.005–1.030)
pH: 6 (ref 5.0–8.0)

## 2016-09-21 MED ORDER — CEPHALEXIN 500 MG PO CAPS: 500.0000 mg | ORAL_CAPSULE | Freq: Four times a day (QID) | ORAL | 0 refills | Status: DC

## 2016-09-21 NOTE — Discharge Instructions (Signed)
It is important to follow-up with your urologist to make sure you are continuing to improve.  We are giving you a prescription of Keflex, in case you have a mild case of prostatitis.  Return here, or the hospital in [place], if needed, for problems

## 2016-09-21 NOTE — ED Notes (Signed)
Bladder scanner: 313mL

## 2016-09-21 NOTE — ED Triage Notes (Signed)
Pt reports decrease in urination, decreased urinary stream, and tightness in bladder area. Has been taking adequate PO fluids. Takes Rapiflow for chronic urinary retention. Took Xanax last night, which helped.

## 2016-09-21 NOTE — ED Provider Notes (Signed)
[facility] DEPT Provider Note   CSN: [id number] Arrival date & time: 09/21/16  1654     History   Chief Complaint Chief Complaint  Patient presents with  . Urinary Retention    HPI Craig [name] is a 61 y.o. male.  He presents for evaluation of decreased ability to completely void while urinating, for 1 week. He has intermittent episodes of prostatitis, and history of prostatic hypertrophy treated with Rapaflo. He denies dysuria or any frequency, nausea, vomiting, fever, chills, weakness or dizziness. There are no other known modifying factors.  HPI  Past Medical History:  Diagnosis Date  . Anxiety   . Asthma   . Chest pain    Nuclear, 2006, normal, / stress echo, June, 2012, normal, EF 60%  . Diabetes mellitus   . Diverticular disease   . Ejection fraction    EF 60%, echo, June, 2012  . Fatigue    Chronic  . Gout   . Hyperlipemia   . Hypertension   . Overweight(278.02)   . Palpitations    30 day event recorder, 2006, negative  . Sleep apnea    Dr. Halford Chessman  . Tachycardia    January, 2013  . Tobacco abuse    former  . Urinary retention     Patient Active Problem List   Diagnosis Date Noted  . Tachycardia   . Ejection fraction   . Palpitations   . Chest pain   . Sleep apnea   . Gout   . Anxiety   . Tobacco abuse   . Diabetes mellitus   . Asthma   . Diverticular disease   . Overweight(278.02)   . Fatigue     Past Surgical History:  Procedure Laterality Date  . SHOULDER SURGERY     high school       Home Medications    Prior to Admission medications   Medication Sig Start Date End Date Taking? Authorizing Provider  allopurinol (ZYLOPRIM) 100 MG tablet Take 100 mg by mouth daily.   Yes Historical Provider, MD  ALPRAZolam Duanne Moron) 0.5 MG tablet Take 0.5 mg by mouth 4 (four) times daily as needed for anxiety.    Yes Historical Provider, MD  amitriptyline (ELAVIL) 10 MG tablet Take 30 mg by mouth at bedtime.  08/23/16  Yes Historical Provider,  MD  atenolol (TENORMIN) 25 MG tablet Take 25 mg by mouth daily.   Yes Historical Provider, MD  colchicine 0.6 MG tablet Take 0.6 mg by mouth daily. As needed for gout flare.   Yes Historical Provider, MD  Docusate Calcium (STOOL SOFTENER PO) Take 1-2 tablets by mouth daily as needed (constipation).    Yes Historical Provider, MD  indomethacin (INDOCIN) 25 MG capsule Take 25 mg by mouth daily as needed for mild pain. If needed for gout   Yes Historical Provider, MD  RAPAFLO 8 MG CAPS capsule Take 8 mg by mouth daily. 09/19/16  Yes Historical Provider, MD  cephALEXin (KEFLEX) 500 MG capsule Take 1 capsule (500 mg total) by mouth 4 (four) times daily. 09/21/16   Daleen Bo, MD    Family History Family History  Problem Relation Age of Onset  . Hypertension Mother   . Diverticulosis Sister   . Diverticulosis Maternal Grandfather     Social History Social History  Substance Use Topics  . Smoking status: Former Research scientist (life sciences)  . Smokeless tobacco: Never Used     Comment: Only smoked x 1 year in high school.  . Alcohol use No  Allergies   Toprol xl [metoprolol succinate]; Other; Wheat; Ciprofloxacin; and Zithromax [azithromycin dihydrate]   Review of Systems Review of Systems  All other systems reviewed and are negative.    Physical Exam Updated Vital Signs BP 137/81 (BP Location: Right Arm)   Pulse 85   Temp 97.8 F (36.6 C) (Oral)   Resp 18   SpO2 97%   Physical Exam  Constitutional: He is oriented to person, place, and time. He appears well-developed. No distress.  He is overweight  HENT:  Head: Normocephalic and atraumatic.  Right Ear: External ear normal.  Left Ear: External ear normal.  Eyes: Conjunctivae and EOM are normal. Pupils are equal, round, and reactive to light.  Neck: Normal range of motion and phonation normal. Neck supple.  Cardiovascular: Normal rate.   Pulmonary/Chest: Effort normal. He exhibits no bony tenderness.  Musculoskeletal: Normal range of  motion.  Neurological: He is alert and oriented to person, place, and time. No cranial nerve deficit or sensory deficit. He exhibits normal muscle tone. Coordination normal.  Skin: Skin is warm, dry and intact.  Psychiatric: He has a normal mood and affect. His behavior is normal. Judgment and thought content normal.  Nursing note and vitals reviewed.    ED Treatments / Results  Labs (all labs ordered are listed, but only abnormal results are displayed) Labs Reviewed  URINALYSIS, ROUTINE W REFLEX MICROSCOPIC    EKG  EKG Interpretation None       Radiology No results found.  Procedures Procedures (including critical care time)  Medications Ordered in ED Medications - No data to display   Initial Impression / Assessment and Plan / ED Course  I have reviewed the triage vital signs and the nursing notes.  Pertinent labs & imaging results that were available during my care of the patient were reviewed by me and considered in my medical decision making (see chart for details).  Clinical Course as of Sep 21 1948  Sat Sep 21, 2016  1949 Bladder scan showed elevated urinary content, 350 cc. Patient was able to void about 150 cc after the scan.  [EW]    Clinical Course User Index [EW] Daleen Bo, MD    Medications - No data to display  Patient Vitals for the past 24 hrs:  BP Temp Temp src Pulse Resp SpO2  09/21/16 1707 137/81 97.8 F (36.6 C) Oral 85 18 97 %    7:50 PM Reevaluation with update and discussion. After initial assessment and treatment, an updated evaluation reveals He remains comfortable and has no further complaints. Findings discussed with the patient, all questions were answered. Lian Pounds L    Final Clinical Impressions(s) / ED Diagnoses   Final diagnoses:  Prostate hypertrophy   Patient with chronic prostatic hypertrophy and history of prostatitis, presenting now with altered urinary output, flow. He is nontoxic. Doubt systemic bacterial  infection, metabolic instability or impending vascular collapse. Will cover with Keflex for possible prostatitis. Strong recommendation for follow-up with urology for ongoing management.  Nursing Notes Reviewed/ Care Coordinated Applicable Imaging Reviewed Interpretation of Laboratory Data incorporated into ED treatment  The patient appears reasonably screened and/or stabilized for discharge and I doubt any other medical condition or other [facility] requiring further screening, evaluation, or treatment in the ED at this time prior to discharge.  Plan: Home Medications- continue; Home Treatments- Rest; return here if the recommended treatment, does not improve the symptoms; Recommended follow up- PCP prn. Urology 3 days, and prn   New Prescriptions  New Prescriptions   CEPHALEXIN (KEFLEX) 500 MG CAPSULE    Take 1 capsule (500 mg total) by mouth 4 (four) times daily.     Daleen Bo, MD 09/21/16 [phone]

## 2016-09-23 DIAGNOSIS — R3914 Feeling of incomplete bladder emptying: Secondary | ICD-10-CM | POA: Diagnosis not present

## 2016-09-27 DIAGNOSIS — R3914 Feeling of incomplete bladder emptying: Secondary | ICD-10-CM | POA: Diagnosis not present

## 2016-09-27 DIAGNOSIS — R350 Frequency of micturition: Secondary | ICD-10-CM | POA: Diagnosis not present

## 2016-09-27 DIAGNOSIS — R338 Other retention of urine: Secondary | ICD-10-CM | POA: Diagnosis not present

## 2016-10-04 DIAGNOSIS — N401 Enlarged prostate with lower urinary tract symptoms: Secondary | ICD-10-CM | POA: Diagnosis not present

## 2016-10-04 DIAGNOSIS — N419 Inflammatory disease of prostate, unspecified: Secondary | ICD-10-CM | POA: Diagnosis not present

## 2016-10-10 DIAGNOSIS — R3914 Feeling of incomplete bladder emptying: Secondary | ICD-10-CM | POA: Diagnosis not present

## 2016-10-10 DIAGNOSIS — N320 Bladder-neck obstruction: Secondary | ICD-10-CM | POA: Diagnosis not present

## 2016-10-15 DIAGNOSIS — R3914 Feeling of incomplete bladder emptying: Secondary | ICD-10-CM | POA: Diagnosis not present

## 2016-10-21 ENCOUNTER — Telehealth (HOSPITAL_COMMUNITY): Payer: Self-pay | Admitting: Physician Assistant

## 2016-10-21 NOTE — Telephone Encounter (Signed)
07/10/2016 LMOM for pt to call and schedule monitor/ Myoview appt. stpegram  06-20-16 Lvmom to call to reschedule monitor/stress/echo cancel on 06-11-16/saf ------------------------------------------------------------------ 06/11/2016 No Show/Cancel within 24 hours (10/3 per pt personal reasons; pt will call back to reschedule. bs )

## 2016-12-29 DIAGNOSIS — M10021 Idiopathic gout, right elbow: Secondary | ICD-10-CM | POA: Diagnosis not present

## 2017-01-02 DIAGNOSIS — R339 Retention of urine, unspecified: Secondary | ICD-10-CM | POA: Diagnosis not present

## 2017-01-21 DIAGNOSIS — C189 Malignant neoplasm of colon, unspecified: Secondary | ICD-10-CM | POA: Diagnosis not present

## 2017-01-21 DIAGNOSIS — R000 Tachycardia, unspecified: Secondary | ICD-10-CM | POA: Diagnosis not present

## 2017-01-21 DIAGNOSIS — M109 Gout, unspecified: Secondary | ICD-10-CM | POA: Diagnosis not present

## 2017-04-01 DIAGNOSIS — R339 Retention of urine, unspecified: Secondary | ICD-10-CM | POA: Diagnosis not present

## 2017-04-11 DIAGNOSIS — N319 Neuromuscular dysfunction of bladder, unspecified: Secondary | ICD-10-CM | POA: Diagnosis not present

## 2017-04-11 DIAGNOSIS — R339 Retention of urine, unspecified: Secondary | ICD-10-CM | POA: Diagnosis not present

## 2017-04-13 DIAGNOSIS — H6093 Unspecified otitis externa, bilateral: Secondary | ICD-10-CM | POA: Diagnosis not present

## 2017-04-13 DIAGNOSIS — H6123 Impacted cerumen, bilateral: Secondary | ICD-10-CM | POA: Diagnosis not present

## 2017-04-13 DIAGNOSIS — R000 Tachycardia, unspecified: Secondary | ICD-10-CM | POA: Diagnosis not present

## 2017-04-14 DIAGNOSIS — R339 Retention of urine, unspecified: Secondary | ICD-10-CM | POA: Diagnosis not present

## 2017-04-28 DIAGNOSIS — R338 Other retention of urine: Secondary | ICD-10-CM | POA: Diagnosis not present

## 2017-04-28 DIAGNOSIS — M109 Gout, unspecified: Secondary | ICD-10-CM | POA: Diagnosis not present

## 2017-04-28 DIAGNOSIS — G4733 Obstructive sleep apnea (adult) (pediatric): Secondary | ICD-10-CM | POA: Diagnosis not present

## 2017-05-13 DIAGNOSIS — R339 Retention of urine, unspecified: Secondary | ICD-10-CM | POA: Diagnosis not present

## 2017-05-14 DIAGNOSIS — L821 Other seborrheic keratosis: Secondary | ICD-10-CM | POA: Diagnosis not present

## 2017-05-14 DIAGNOSIS — L820 Inflamed seborrheic keratosis: Secondary | ICD-10-CM | POA: Diagnosis not present

## 2017-06-05 ENCOUNTER — Encounter (HOSPITAL_BASED_OUTPATIENT_CLINIC_OR_DEPARTMENT_OTHER): Payer: Self-pay | Admitting: *Deleted

## 2017-06-05 ENCOUNTER — Emergency Department (HOSPITAL_BASED_OUTPATIENT_CLINIC_OR_DEPARTMENT_OTHER)
Admission: EM | Admit: 2017-06-05 | Discharge: 2017-06-05 | Disposition: A | Discharge status: Home / Self Care | Payer: 59 | Attending: Emergency Medicine | Admitting: Emergency Medicine

## 2017-06-05 DIAGNOSIS — E119 Type 2 diabetes mellitus without complications: Secondary | ICD-10-CM | POA: Diagnosis not present

## 2017-06-05 DIAGNOSIS — S60221A Contusion of right hand, initial encounter: Secondary | ICD-10-CM | POA: Insufficient documentation

## 2017-06-05 DIAGNOSIS — I10 Essential (primary) hypertension: Secondary | ICD-10-CM | POA: Diagnosis not present

## 2017-06-05 DIAGNOSIS — X58XXXA Exposure to other specified factors, initial encounter: Secondary | ICD-10-CM | POA: Diagnosis not present

## 2017-06-05 DIAGNOSIS — Z87891 Personal history of nicotine dependence: Secondary | ICD-10-CM | POA: Insufficient documentation

## 2017-06-05 DIAGNOSIS — Y999 Unspecified external cause status: Secondary | ICD-10-CM | POA: Insufficient documentation

## 2017-06-05 DIAGNOSIS — Y929 Unspecified place or not applicable: Secondary | ICD-10-CM | POA: Insufficient documentation

## 2017-06-05 DIAGNOSIS — Y939 Activity, unspecified: Secondary | ICD-10-CM | POA: Insufficient documentation

## 2017-06-05 DIAGNOSIS — Z79899 Other long term (current) drug therapy: Secondary | ICD-10-CM | POA: Diagnosis not present

## 2017-06-05 DIAGNOSIS — M79641 Pain in right hand: Secondary | ICD-10-CM | POA: Diagnosis present

## 2017-06-05 DIAGNOSIS — J45909 Unspecified asthma, uncomplicated: Secondary | ICD-10-CM | POA: Insufficient documentation

## 2017-06-05 NOTE — ED Provider Notes (Signed)
[facility] Provider Note   CSN: [id number] Arrival date & time: 06/05/17  0701     History   Chief Complaint Chief Complaint  Patient presents with  . Hand Pain    HPI Craig [name] is a 61 y.o. male.  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 He is concerned that a spider might have bitten him. He endorses mild swelling to the finger. No associated pain or redness. No fevers, chills, decreased range of motion.    Past Medical History:  Diagnosis Date  . Anxiety   . Asthma   . Chest pain    Nuclear, 2006, normal, / stress echo, June, 2012, normal, EF 60%  . Diabetes mellitus   . Diverticular disease   . Ejection fraction    EF 60%, echo, June, 2012  . Fatigue    Chronic  . Gout   . Hyperlipemia   . Hypertension   . Overweight(278.02)   . Palpitations    30 day event recorder, 2006, negative  . Sleep apnea    Dr. Halford Chessman  . Tachycardia    January, 2013  . Tobacco abuse    former  . Urinary retention     Patient Active Problem List   Diagnosis Date Noted  . Tachycardia   . Ejection fraction   . Palpitations   . Chest pain   . Sleep apnea   . Gout   . Anxiety   . Tobacco abuse   . Diabetes mellitus   . Asthma   . Diverticular disease   . Overweight(278.02)   . Fatigue     Past Surgical History:  Procedure Laterality Date  . SHOULDER SURGERY     high school       Home Medications    Prior to Admission medications   Medication Sig Start Date End Date Taking? Authorizing Provider  allopurinol (ZYLOPRIM) 100 MG tablet Take 100 mg by mouth daily.    [provider]  ALPRAZolam Duanne Moron) 0.5 MG tablet Take 0.5 mg by mouth 4 (four) times daily as needed for anxiety.     [provider]  amitriptyline (ELAVIL) 10 MG tablet Take 30 mg by mouth at bedtime.  08/23/16   [provider]  atenolol (TENORMIN) 25 MG tablet Take 25 mg by mouth daily.    [provider]  colchicine 0.6 MG tablet Take 0.6 mg by mouth daily. As needed for gout flare.    [provider]  Docusate Calcium (STOOL SOFTENER PO) Take 1-2 tablets by mouth daily as needed (constipation).     [provider]  indomethacin (INDOCIN) 25 MG capsule Take 25 mg by mouth daily as needed for mild pain. If needed for gout    [provider]  RAPAFLO 8 MG CAPS capsule Take 8 mg by mouth daily. 09/19/16   [provider]    Family History Family History  Problem Relation Age of Onset  . Hypertension Mother   . Diverticulosis Sister   . Diverticulosis Maternal Grandfather     Social History Social History  Substance Use Topics  . Smoking status: Former Research scientist (life sciences)  . Smokeless tobacco: Never Used     Comment: Only smoked x 1 year in high school.  . Alcohol use No     Allergies   Toprol xl [metoprolol succinate]; Other; Wheat; Ciprofloxacin; and Zithromax [azithromycin dihydrate]   Review of Systems Review of Systems  Physical Exam Updated Vital Signs BP 126/81 (BP Location: Right Arm)   Pulse (!) 57   Temp 98.3 F (36.8 C) (Oral)   Resp 20   Ht 5\' 8"  (1.727 m)   Wt 117.9 kg (260 lb)   SpO2 98%   BMI 39.53 kg/m   Physical Exam  Constitutional: He is oriented to person, place, and time. He appears well-developed and well-nourished. No distress.  HENT:  Head: Normocephalic and atraumatic.  Right Ear: External ear normal.  Left Ear: External ear normal.  Nose: Nose normal.  Mouth/Throat: Mucous membranes are normal. No trismus in the jaw.  Eyes: Conjunctivae and EOM are normal. No scleral icterus.  Strabismus  Neck: Normal range of motion and phonation normal.  Cardiovascular: Normal rate and regular rhythm.   Pulmonary/Chest: Effort normal. No stridor. No respiratory distress.  Abdominal: He exhibits no distension.  Musculoskeletal: Normal range of motion. He exhibits no edema.       Right hand: He exhibits normal  range of motion, no tenderness, no bony tenderness, no deformity and no laceration. Normal sensation noted. Normal strength noted.       Hands: No visible insect bite.  Neurological: He is alert and oriented to person, place, and time.  Skin: He is not diaphoretic.  Psychiatric: He has a normal mood and affect. His behavior is normal.  Vitals reviewed.    ED Treatments / Results  Labs (all labs ordered are listed, but only abnormal results are displayed) Labs Reviewed - No data to display  EKG  EKG Interpretation None       Radiology No results found.  Procedures Procedures (including critical care time)  Medications Ordered in ED Medications - No data to display   Initial Impression / Assessment and Plan / ED Course  I have reviewed the triage vital signs and the nursing notes.  Pertinent labs & imaging results that were available during my care of the patient were reviewed by me and considered in my medical decision making (see chart for details).     Finger contusion. Not suggestive of insect bite, cellulitis, flexor tendon synovitis. Recommend monitoring and close PCP as needed.  The patient is safe for discharge with strict return precautions.   Final Clinical Impressions(s) / ED Diagnoses   Final diagnoses:  Contusion of right hand, initial encounter   Disposition: Discharge  Condition: Good  I have discussed the results, Dx and Tx plan with the patient who expressed understanding and agree(s) with the plan. Discharge instructions discussed at great length. The patient was given strict return precautions who verbalized understanding of the instructions. No further questions at time of discharge.    New Prescriptions   No medications on file    Follow Up: [employer] [place] [id number] [phone]  Schedule an appointment as soon as possible for a visit  As needed      Fatima Blank, MD 06/05/17  [phone]

## 2017-06-05 NOTE — ED Triage Notes (Signed)
Pt reports possible insect bite to his right hand while mowing grass on Tuesday. Denies fevers, pain or any other c/o. Slight swelling noted to third finger.

## 2017-06-27 DIAGNOSIS — M25561 Pain in right knee: Secondary | ICD-10-CM | POA: Diagnosis not present

## 2017-06-27 DIAGNOSIS — M25562 Pain in left knee: Secondary | ICD-10-CM | POA: Diagnosis not present

## 2017-07-16 DIAGNOSIS — L820 Inflamed seborrheic keratosis: Secondary | ICD-10-CM | POA: Diagnosis not present

## 2017-07-22 DIAGNOSIS — M25562 Pain in left knee: Secondary | ICD-10-CM | POA: Diagnosis not present

## 2017-07-22 DIAGNOSIS — M25561 Pain in right knee: Secondary | ICD-10-CM | POA: Diagnosis not present

## 2017-07-28 DIAGNOSIS — R339 Retention of urine, unspecified: Secondary | ICD-10-CM | POA: Diagnosis not present

## 2017-08-12 DIAGNOSIS — L820 Inflamed seborrheic keratosis: Secondary | ICD-10-CM | POA: Diagnosis not present

## 2017-08-12 DIAGNOSIS — D1801 Hemangioma of skin and subcutaneous tissue: Secondary | ICD-10-CM | POA: Diagnosis not present

## 2017-08-12 DIAGNOSIS — L821 Other seborrheic keratosis: Secondary | ICD-10-CM | POA: Diagnosis not present

## 2017-08-12 DIAGNOSIS — D2261 Melanocytic nevi of right upper limb, including shoulder: Secondary | ICD-10-CM | POA: Diagnosis not present

## 2017-08-13 DIAGNOSIS — R1030 Lower abdominal pain, unspecified: Secondary | ICD-10-CM | POA: Diagnosis not present

## 2017-08-21 ENCOUNTER — Other Ambulatory Visit (HOSPITAL_COMMUNITY): Payer: Self-pay | Admitting: Internal Medicine

## 2017-08-21 DIAGNOSIS — R1030 Lower abdominal pain, unspecified: Secondary | ICD-10-CM

## 2017-09-04 ENCOUNTER — Encounter (HOSPITAL_COMMUNITY): Payer: Self-pay

## 2017-09-04 ENCOUNTER — Ambulatory Visit (HOSPITAL_COMMUNITY): Payer: 59

## 2017-09-10 ENCOUNTER — Telehealth: Payer: Self-pay | Admitting: Physician Assistant

## 2017-09-10 NOTE — Telephone Encounter (Signed)
Patient calling and states that last night after he ate his supper that he started having indigestion and his HR was irregular and over 100. Patient states that he was SOB and was having a little soreness on his L side that he attributes to laying on the couch. Patient states that he had a HA last night as well which was relieved with Tylenol. Patient states that he normally takes atenolol 25 mg QD and has been instructed to take an extra atenolol if his HR goes over 100. Patient states that he took an extra 25 mg of atenolol last night. Patient states that he feels fatigued and tired today and that his HR has been 43-45 despite trying activities to increase it. Patient denies having any syncopal episodes, chest pain, lightheadedness, or dizziness. Patient states that his HR has been around 45 all day with BP readings of 107/61 and 125/64. Patient is an old Dr. Ron [name] patient who was last seen by Craig Dopp, PA on 06/05/16. Patient states that he is nervous because he has had a bad experience where he ended up in the hospital back in 2008 with cardiac arrest. Instructed the patient not to take his atenolol. Patient asking if there is a doctor here for me to ask about stopping his atenolol since he has been told not to stop it. Made patient aware that I will discuss with DOD Craig [name]. Discussed with Craig [name] who agrees that the patient should not take his atenolol anymore unless his HR is greater than 100 and that he should be seen in the office tomorrow for EKG and evaluation. Called and made patient aware of Craig [name] instructions. Appointment made with Craig [name] tomorrow at 2:20 PM (as this is the only appointment available). Made patient aware that if his symptoms worsen before his appointment tomorrow that he needs to be evaluated in the ER. Patient verbalized understanding and thanked me for the call.

## 2017-09-10 NOTE — Telephone Encounter (Signed)
Received a call from the operator. This call should had been sent to Triage for a Triage nurse to further evaluate pt. I will route to Triage.

## 2017-09-10 NOTE — Telephone Encounter (Signed)
New Message   STAT if HR is under 50 or over 120 (normal HR is 60-100 beats per minute)  1) What is your heart rate? 40-42  2) Do you have a log of your heart rate readings (document readings)? No  3) Do you have any other symptoms? irregular heart rate, tired and anxious, SOB, a little soreness on left side

## 2017-09-11 ENCOUNTER — Encounter: Payer: Self-pay | Admitting: Cardiovascular Disease

## 2017-09-11 ENCOUNTER — Encounter (INDEPENDENT_AMBULATORY_CARE_PROVIDER_SITE_OTHER): Payer: Self-pay

## 2017-09-11 ENCOUNTER — Ambulatory Visit: Payer: 59 | Admitting: Cardiovascular Disease

## 2017-09-11 VITALS — BP 132/60 | HR 99 | Ht 68.0 in | Wt 264.4 lb

## 2017-09-11 DIAGNOSIS — Z6839 Body mass index (BMI) 39.0-39.9, adult: Secondary | ICD-10-CM | POA: Diagnosis not present

## 2017-09-11 DIAGNOSIS — R002 Palpitations: Secondary | ICD-10-CM

## 2017-09-11 DIAGNOSIS — I442 Atrioventricular block, complete: Secondary | ICD-10-CM | POA: Diagnosis not present

## 2017-09-11 DIAGNOSIS — R079 Chest pain, unspecified: Secondary | ICD-10-CM

## 2017-09-11 MED ORDER — BISOPROLOL FUMARATE 5 MG PO TABS: 5.0000 mg | ORAL_TABLET | Freq: Every day | ORAL | 11 refills | Status: DC

## 2017-09-11 NOTE — Progress Notes (Signed)
Cardiology Office Note Date:  09/12/2017   ID:  JELAN BATTERTON, DOB 03/17/1956, MRN [id number]  PCP:  Redmond School, MD  Cardiologist:  Sherren Mocha, MD    Chief Complaint  Patient presents with  . Chest Pain  . Palpitations   History of Present Illness: Craig [name] is a 62 y.o. male who presents for follow-up evaluation.  He has a history of heart palpitations.    Reports he's been under a 'tremendous amount of stress.' There have been family issues and he has been trying to buy a house. Has had more heart palpitations. His HR has been up an down, in the range of 100-110 bpm at rest and sometimes in the 40's. He has experienced more frequent palpitations.  He also has a discomfort in his chest associated with this.  He is not had exertional chest pain or pressure.  He has mild shortness of breath with activity.  He denies edema, orthopnea, PND, or frank syncope.  He has had some lightheadedness.  He has been drinking a lot of tea.  Past Medical History:  Diagnosis Date  . Anxiety   . Asthma   . Chest pain    Nuclear, 2006, normal, / stress echo, June, 2012, normal, EF 60%  . Diabetes mellitus   . Diverticular disease   . Ejection fraction    EF 60%, echo, June, 2012  . Fatigue    Chronic  . Gout   . Hyperlipemia   . Hypertension   . Overweight(278.02)   . Palpitations    30 day event recorder, 2006, negative  . Sleep apnea    Dr. Halford Chessman  . Tachycardia    January, 2013  . Tobacco abuse    former  . Urinary retention     Past Surgical History:  Procedure Laterality Date  . SHOULDER SURGERY     high school    Current Outpatient Medications  Medication Sig Dispense Refill  . allopurinol (ZYLOPRIM) 100 MG tablet Take 100 mg by mouth daily.    Marland Kitchen ALPRAZolam (XANAX) 0.5 MG tablet Take 0.5 mg by mouth 4 (four) times daily as needed for anxiety.     Marland Kitchen amitriptyline (ELAVIL) 10 MG tablet Take 30 mg by mouth at bedtime.     . colchicine 0.6 MG tablet Take 0.6 mg by  mouth daily. As needed for gout flare.    Mariane Baumgarten Calcium (STOOL SOFTENER PO) Take 1-2 tablets by mouth daily as needed (constipation).     . indomethacin (INDOCIN) 25 MG capsule Take 25 mg by mouth daily as needed for mild pain. If needed for gout    . RAPAFLO 8 MG CAPS capsule Take 8 mg by mouth daily.    . bisoprolol (ZEBETA) 5 MG tablet Take 1 tablet (5 mg total) by mouth daily. 30 tablet 11   No current facility-administered medications for this visit.     Allergies:   Metoprolol; Toprol xl [metoprolol succinate]; Other; Sulfamethoxazole-trimethoprim; Wheat; Azithromycin; Ciprofloxacin; Wheat bran; and Zithromax [azithromycin dihydrate]   Social History:  The patient  reports that he has quit smoking. he has never used smokeless tobacco. He reports that he does not drink alcohol or use drugs.   Family History:  The patient's  family history includes Diverticulosis in his maternal grandfather and sister; Hypertension in his mother.   ROS:  Please see the history of present illness.  Otherwise, review of systems is positive for hearing loss, DOE, depression, muscle pain, anxiety, snoring,  headaches.  All other systems are reviewed and negative.   PHYSICAL EXAM: VS:  BP 132/60   Pulse 99   Ht 5\' 8"  (1.727 m)   Wt 264 lb 6.4 oz (119.9 kg)   BMI 40.20 kg/m  , BMI Body mass index is 40.2 kg/m. GEN: Well nourished, well developed, overweight male in no acute distress  HEENT: normal  Neck: no JVD, no masses. No carotid bruits Cardiac: RRR without murmur or gallop     Respiratory:  clear to auscultation bilaterally, normal work of breathing GI: soft, nontender, nondistended, + BS MS: no deformity or atrophy  Ext: no pretibial edema, pedal pulses 2+= bilaterally Skin: warm and dry, no rash Neuro:  Strength and sensation are intact Psych: euthymic mood, full affect  EKG:  EKG is ordered today. The ekg ordered today shows NSR with ventricular bigeminy, HR 99 bpm, otherwise  normal  Recent Labs: 09/11/2017: BUN 10; Creatinine, Ser 0.90; Magnesium WILL FOLLOW; Potassium 4.4; Sodium 143   Lipid Panel  No results found for: CHOL, TRIG, HDL, CHOLHDL, VLDL, LDLCALC, LDLDIRECT    Wt Readings from Last 3 Encounters:  09/11/17 264 lb 6.4 oz (119.9 kg)  06/05/17 260 lb (117.9 kg)  07/06/16 265 lb (120.2 kg)    ASSESSMENT AND PLAN: 1.  Symptomatic heart palpitations: The patient has ventricular bigeminy on his EKG.  His exam is otherwise benign.  We will check a metabolic panel and magnesium level to rule out electrolyte disturbance.  We will check a 48-hour Holter monitor to quantify his PVC burden.  He was counseled about caffeine reduction and the need to push non-caffeinated fluids.  We will check an echocardiogram to evaluate for any structural heart abnormality or LV dysfunction.  Since he is having increasing palpitations and not responding well to atenolol, will try him on bisoprolol.  2.  Chest pain: Typical and atypical features.  In this 62 year old male with obesity, will evaluate him for cardiac ischemia with an exercise Myoview stress test.  3.  Obesity: Lifestyle modification counseling done.  Current medicines are reviewed with the patient today.  The patient does not have concerns regarding medicines.  Labs/ tests ordered today include:   Orders Placed This Encounter  Procedures  . Basic metabolic panel  . Magnesium  . MYOCARDIAL PERFUSION IMAGING  . HOLTER MONITOR - 48 HOUR  . EKG 12-Lead  . ECHOCARDIOGRAM COMPLETE    Disposition:   FU one month with Richardson Dopp, PA-C  Signed, Sherren Mocha, MD  09/12/2017 8:19 AM    [facility] [address], [place], [place]  [address] Phone: [phone]; Fax: [phone]

## 2017-09-11 NOTE — Patient Instructions (Addendum)
Medication Instructions:  1) STOP ATENOLOL 2) START BISOPROLOL 5 mg daily  Labwork: TODAY: BMET, magnesium  Testing/Procedures: Dr. Burt Knack recommends you have a NUCLEAR STRESS TEST.  Your provider has requested that you have an echocardiogram. Echocardiography is a painless test that uses sound waves to create images of your heart. It provides your doctor with information about the size and shape of your heart and how well your heart's chambers and valves are working. This procedure takes approximately one hour. There are no restrictions for this procedure.    Your physician has recommended that you wear a holter monitor. Holter monitors are medical devices that record the heart's electrical activity. Doctors most often use these monitors to diagnose arrhythmias. Arrhythmias are problems with the speed or rhythm of the heartbeat. The monitor is a small, portable device. You can wear one while you do your normal daily activities. This is usually used to diagnose what is causing palpitations/syncope (passing out).  Follow-Up: Your provider recommends that you schedule a follow-up appointment in 1 MONTH with Richardson Dopp, PA.  Any Other Special Instructions Will Be Listed Below (If Applicable).     If you need a refill on your cardiac medications before your next appointment, please call your pharmacy.

## 2017-09-12 ENCOUNTER — Telehealth: Payer: Self-pay | Admitting: Cardiovascular Disease

## 2017-09-12 ENCOUNTER — Encounter: Payer: Self-pay | Admitting: Cardiovascular Disease

## 2017-09-12 LAB — BASIC METABOLIC PANEL
BUN/Creatinine Ratio: 11 (ref 9–20)
BUN: 10 mg/dL (ref 6–24)
CALCIUM: 9.9 mg/dL (ref 8.7–10.2)
CO2: 23 mmol/L (ref 20–29)
CREATININE: 0.9 mg/dL (ref 0.76–1.27)
Chloride: 104 mmol/L (ref 96–106)
GFR calc Af Amer: 108 mL/min/{1.73_m2} (ref >59)
GFR, EST NON AFRICAN AMERICAN: 93 mL/min/{1.73_m2} (ref >59)
Glucose: 93 mg/dL (ref 65–99)
POTASSIUM: 4.4 mmol/L (ref 3.5–5.2)
Sodium: 143 mmol/L (ref 134–144)

## 2017-09-12 LAB — MAGNESIUM: Magnesium: 2 mg/dL (ref 1.6–2.3)

## 2017-09-12 MED ORDER — ATENOLOL 25 MG PO TABS: 25.0000 mg | ORAL_TABLET | Freq: Every day | ORAL | 11 refills | Status: DC

## 2017-09-12 NOTE — Telephone Encounter (Signed)
Craig [name] is calling because he took his Bisoprolol 5 mg at 5:30pm and 6:30 am this morning and his resting heart rate is about 36-37 beats a minute. Thinks he took it to soon . Please call

## 2017-09-12 NOTE — Telephone Encounter (Signed)
Patient reports his HR is 36-37 on his finger monitor.  He is completely asymptomatic. He denies dizziness, palpitations. His BP is 116/60. Reviewed with Dr. Burt Knack.  Patient is to STOP BISOPROLOL. He will restart Atenolol 25 mg daily on Monday. He understands to check HR prior to taking and will hold if HR < 60 bpm.  Rescheduled holter monitor to 1/8. He will call if symptoms occur. He was grateful for call and agrees with treatment plan.

## 2017-09-16 ENCOUNTER — Ambulatory Visit (INDEPENDENT_AMBULATORY_CARE_PROVIDER_SITE_OTHER): Payer: 59

## 2017-09-16 ENCOUNTER — Telehealth: Payer: Self-pay | Admitting: *Deleted

## 2017-09-16 DIAGNOSIS — R079 Chest pain, unspecified: Secondary | ICD-10-CM | POA: Diagnosis not present

## 2017-09-16 DIAGNOSIS — R002 Palpitations: Secondary | ICD-10-CM

## 2017-09-16 NOTE — Telephone Encounter (Signed)
Patient concerned regarding pain in upper left thigh, inside.  Pain went away 24 hour after starting Bisoprolol.  It has not returned even after switch back to Atenolol.  Patient concerned this may be related to heart since it went away with the beta blocker. Please call

## 2017-09-16 NOTE — Telephone Encounter (Signed)
Patient reports he had L groin pain for 2 months prior to taking bisoprolol. Within 24 hours of taking the medication, the pain was "magically gone." He says it is too strange to be coincidental. He was checked out for hernia and cleared. He looked at the internet and states the pain was "right at the femoral artery, so the bisoprolol helping must mean it was a heart problem."  He also wants Dr. Burt Knack to know he had a tick bite last October and wants to know if he thinks that could be a cause of his heart issues now. He states he had a friend die of a heart attack after getting bitten by a tick.  He reports his HR is now in the 60s and BP is fine. He has no complaints other than being tired. He wants to know from Dr. Burt Knack why this is happening to his heart now, why he is so tired, if it could be caused by the tick, and if further testing needs to be done with his legs.  To Dr. Burt Knack.

## 2017-09-18 ENCOUNTER — Telehealth (HOSPITAL_COMMUNITY): Payer: Self-pay | Admitting: *Deleted

## 2017-09-18 NOTE — Telephone Encounter (Signed)
Left message on voicemail per DPR in reference to upcoming appointment scheduled on 09/23/16 with detailed instructions given per Myocardial Perfusion Study Information Sheet for the test. LM to arrive 15 minutes early, and that it is imperative to arrive on time for appointment to keep from having the test rescheduled. If you need to cancel or reschedule your appointment, please call the office within 24 hours of your appointment. Failure to do so may result in a cancellation of your appointment, and a $50 no show fee. Phone number given for call back for any questions. Hawkins Seaman Jacqueline    

## 2017-09-22 ENCOUNTER — Telehealth: Payer: Self-pay | Admitting: Cardiovascular Disease

## 2017-09-22 NOTE — Telephone Encounter (Signed)
New message     Patient called to cancel his echo and stress test due to having no power, he will call back when he gets the change to reschedule , he wanted to let Dr Burt Knack know that he is back in rhythm.

## 2017-09-23 ENCOUNTER — Encounter (HOSPITAL_COMMUNITY): Payer: 59

## 2017-09-23 ENCOUNTER — Other Ambulatory Visit (HOSPITAL_COMMUNITY): Payer: 59

## 2017-09-26 NOTE — Telephone Encounter (Signed)
Called to check on patient since tests have not been rescheduled. Instructed him to call back to arrange.

## 2017-09-29 ENCOUNTER — Other Ambulatory Visit: Payer: Self-pay

## 2017-09-29 ENCOUNTER — Emergency Department (INDEPENDENT_AMBULATORY_CARE_PROVIDER_SITE_OTHER)
Admission: EM | Admit: 2017-09-29 | Discharge: 2017-09-29 | Disposition: A | Discharge status: Home / Self Care | Payer: 59 | Source: Home / Self Care | Attending: Family Medicine | Admitting: Family Medicine

## 2017-09-29 ENCOUNTER — Encounter (HOSPITAL_COMMUNITY): Payer: Self-pay | Admitting: Cardiovascular Disease

## 2017-09-29 DIAGNOSIS — Z0189 Encounter for other specified special examinations: Secondary | ICD-10-CM | POA: Diagnosis not present

## 2017-09-29 DIAGNOSIS — R300 Dysuria: Secondary | ICD-10-CM

## 2017-09-29 HISTORY — DX: Other specified cardiac arrhythmias: I49.8

## 2017-09-29 HISTORY — DX: Cardiac arrhythmia, unspecified: I49.9

## 2017-09-29 LAB — POCT URINALYSIS DIP (MANUAL ENTRY)
BILIRUBIN UA: NEGATIVE mg/dL
Bilirubin, UA: NEGATIVE
Blood, UA: NEGATIVE
Glucose, UA: NEGATIVE mg/dL
NITRITE UA: NEGATIVE
PH UA: 6 (ref 5.0–8.0)
PROTEIN UA: NEGATIVE mg/dL
Spec Grav, UA: 1.01 (ref 1.010–1.025)
Urobilinogen, UA: 0.2 E.U./dL

## 2017-09-29 MED ORDER — CEPHALEXIN 500 MG PO CAPS: 500.0000 mg | ORAL_CAPSULE | Freq: Two times a day (BID) | ORAL | 0 refills | Status: DC

## 2017-09-29 NOTE — Discharge Instructions (Signed)
Increase fluid intake. Begin Cephalexin if urine culture positive.

## 2017-09-29 NOTE — ED Provider Notes (Signed)
[name]    CSN: [id number] Arrival date & time: 09/29/17  1039     History   Chief Complaint Chief Complaint  Patient presents with  . Dysuria    HPI Craig [name] is a 62 y.o. male.   Patient has a history of neurogenic bladder, followed by [facility] urology.  He has noted that his urine has been cloudy and has had increased sediment for about 3 months.  He self-caths occasionally.  He has occasional mild dysuria, but denies frequency or urgency.  No flank pain or hematuria.  No fevers, chills, and sweats   The history is provided by the patient.    Past Medical History:  Diagnosis Date  . Anxiety   . Asthma   . Chest pain    Nuclear, 2006, normal, / stress echo, June, 2012, normal, EF 60%  . Diabetes mellitus   . Diverticular disease   . Ejection fraction    EF 60%, echo, June, 2012  . Fatigue    Chronic  . Gout   . Hyperlipemia   . Hypertension   . Overweight(278.02)   . Palpitations    30 day event recorder, 2006, negative  . Sleep apnea    Dr. Halford Chessman  . Tachycardia    January, 2013  . Tobacco abuse    former  . Urinary retention   . Ventricular bigeminy     Patient Active Problem List   Diagnosis Date Noted  . Tachycardia   . Ejection fraction   . Palpitations   . Chest pain   . Sleep apnea   . Gout   . Anxiety   . Tobacco abuse   . Diabetes mellitus   . Asthma   . Diverticular disease   . Overweight(278.02)   . Fatigue     Past Surgical History:  Procedure Laterality Date  . SHOULDER SURGERY     high school       Home Medications    Prior to Admission medications   Medication Sig Start Date End Date Taking? Authorizing Provider  allopurinol (ZYLOPRIM) 100 MG tablet Take 100 mg by mouth daily.    [provider]  ALPRAZolam Duanne Moron) 0.5 MG tablet Take 0.5 mg by mouth 4 (four) times daily as needed for anxiety.     [provider]  amitriptyline (ELAVIL) 10 MG tablet Take 30 mg by mouth  at bedtime.  08/23/16   [provider]  atenolol (TENORMIN) 25 MG tablet Take 1 tablet (25 mg total) by mouth daily. 09/12/17 09/07/18  Sherren Mocha, MD  cephALEXin (KEFLEX) 500 MG capsule Take 1 capsule (500 mg total) by mouth 2 (two) times daily. (Rx void after 10/07/17) 09/29/17   Kandra Nicolas, MD  colchicine 0.6 MG tablet Take 0.6 mg by mouth daily. As needed for gout flare.    [provider]  Docusate Calcium (STOOL SOFTENER PO) Take 1-2 tablets by mouth daily as needed (constipation).     [provider]  indomethacin (INDOCIN) 25 MG capsule Take 25 mg by mouth daily as needed for mild pain. If needed for gout    [provider]  RAPAFLO 8 MG CAPS capsule Take 8 mg by mouth daily. 09/19/16   [provider]    Family History Family History  Problem Relation Age of Onset  . Hypertension Mother   . Diverticulosis Sister   . Diverticulosis Maternal Grandfather     Social History Social History  Tobacco Use  . Smoking status: Former Research scientist (life sciences)  . Smokeless tobacco: Never Used  . Tobacco comment: Only smoked x 1 year in high school.  Substance Use Topics  . Alcohol use: No    Alcohol/week: 0.0 oz  . Drug use: No     Allergies   Metoprolol; Toprol xl [metoprolol succinate]; Other; Sulfamethoxazole-trimethoprim; Wheat; Azithromycin; Ciprofloxacin; Wheat bran; and Zithromax [azithromycin dihydrate]   Review of Systems Review of Systems  Constitutional: Negative for activity change, appetite change, chills, diaphoresis, fatigue, fever and unexpected weight change.  HENT: Negative.   Eyes: Negative.   Respiratory: Negative.   Cardiovascular: Negative.   Gastrointestinal: Negative.   Genitourinary: Positive for dysuria. Negative for decreased urine volume, difficulty urinating, discharge, flank pain, frequency, genital sores, hematuria, penile pain, penile swelling, scrotal swelling, testicular pain and urgency.  Musculoskeletal:  Negative.   Skin: Negative.      Physical Exam Triage Vital Signs ED Triage Vitals  Enc Vitals Group     BP 09/29/17 1124 119/77     Pulse Rate 09/29/17 1124 65     Resp --      Temp 09/29/17 1124 98.3 F (36.8 C)     Temp Source 09/29/17 1124 Oral     SpO2 09/29/17 1124 93 %     Weight 09/29/17 1125 260 lb (117.9 kg)     Height 09/29/17 1125 5\' 9"  (1.753 m)     Head Circumference --      Peak Flow --      Pain Score --      Pain Loc --      Pain Edu? --      Excl. in [place]? --    No data found.  Updated Vital Signs BP 119/77 (BP Location: Right Arm)   Pulse 65   Temp 98.3 F (36.8 C) (Oral)   Ht 5\' 9"  (1.753 m)   Wt 260 lb (117.9 kg)   SpO2 93%   BMI 38.40 kg/m   Visual Acuity Right Eye Distance:   Left Eye Distance:   Bilateral Distance:    Right Eye Near:   Left Eye Near:    Bilateral Near:     Physical Exam Nursing notes and Vital Signs reviewed. Appearance:  Patient appears stated age, and in no acute distress.    Eyes:  Pupils are equal, round, and reactive to light and accomodation.  Extraocular movement is intact.  Conjunctivae are not inflamed   Pharynx:  Normal; moist mucous membranes  Neck:  Supple.  No adenopathy Lungs:  Clear to auscultation.  Breath sounds are equal.  Moving air well. Heart:  Regular rate and rhythm without murmurs, rubs, or gallops.  Abdomen:   Vague mild tenderness over bladder without masses or hepatosplenomegaly.  Bowel sounds are present.  No CVA or flank tenderness.  Extremities:  No edema.  Skin:  No rash present.     UC Treatments / Results  Labs (all labs ordered are listed, but only abnormal results are displayed) Labs Reviewed  POCT URINALYSIS DIP (MANUAL ENTRY) - Abnormal; Notable for the following components:      Result Value   Leukocytes, UA Small (1+) (*)    All other components within normal limits  URINE CULTURE    EKG  EKG Interpretation None       Radiology No results  found.  Procedures Procedures (including critical care time)  Medications Ordered in UC Medications - No data to display   Initial Impression /  Assessment and Plan / UC Course  I have reviewed the triage vital signs and the nursing notes.  Pertinent labs & imaging results that were available during my care of the patient were reviewed by me and considered in my medical decision making (see chart for details).    Urine culture pending. Increase fluid intake. Begin Cephalexin if urine culture positive (given Rx to hold) Followup with urologist.    Final Clinical Impressions(s) / UC Diagnoses   Final diagnoses:  Dysuria    ED Discharge Orders        Ordered    cephALEXin (KEFLEX) 500 MG capsule  2 times daily     09/29/17 1218          Kandra Nicolas, MD 10/04/17 1435

## 2017-09-29 NOTE — ED Triage Notes (Signed)
Pt states that he has been having urinary issues for several months now.  The last few days, he has noticed his urine was cloudy and had some sediment.  He has had burning several times when urinating.

## 2017-09-30 ENCOUNTER — Telehealth: Payer: Self-pay

## 2017-09-30 LAB — URINE CULTURE
MICRO NUMBER:: 90085360
Result:: NO GROWTH
SPECIMEN QUALITY:: ADEQUATE

## 2017-09-30 NOTE — Telephone Encounter (Signed)
Notified patient of UCX, pt feeling better and feels that his urine is clearing up.  Will follow up as needed.

## 2017-10-08 ENCOUNTER — Encounter (HOSPITAL_COMMUNITY): Payer: Self-pay | Admitting: Radiology

## 2017-10-13 ENCOUNTER — Ambulatory Visit: Payer: 59 | Admitting: Physician Assistant

## 2017-10-13 ENCOUNTER — Encounter: Payer: Self-pay | Admitting: Physician Assistant

## 2017-10-13 VITALS — BP 108/72 | HR 60 | Ht 69.0 in | Wt 267.4 lb

## 2017-10-13 DIAGNOSIS — R002 Palpitations: Secondary | ICD-10-CM | POA: Diagnosis not present

## 2017-10-13 NOTE — Progress Notes (Signed)
Cardiology Office Note:    Date:  10/13/2017   ID:  Craig [name], DOB 01-01-1956, MRN [id number]  PCP:  Redmond School, MD  Cardiologist:  Sherren Mocha, MD   Referring MD: Redmond School, MD   Chief Complaint  Patient presents with  . Follow-up    palpitations    History of Present Illness:    Craig [name] is a 62 y.o. male with a hx of palpitations.  He was seen in 2008 for syncope and was noted to have a 3-3.5-second pause.  He was evaluated by Dr. Caryl Comes and underwent an event monitor which was unremarkable.  He was last seen by Dr. Burt Knack 09/11/17.  He had symptomatic palpitations with ventricular bigeminy on EKG as well as chest pain.  Holter monitor, echocardiogram and nuclear stress test were ordered.  He was placed on bisoprolol.  However, he had bradycardia with this and was changed back to atenolol.  His chart indicates that he canceled his echocardiogram and stress test due to being without power.  These tests have not been rescheduled.  Holter monitor demonstrated sinus rhythm and sinus bradycardia with a few PVCs and PACs.  There were no significant pauses or arrhythmias.  Craig [name] returns for follow up.  After taking 1 dose of bisoprolol, he felt like his PVCs stopped. He has felt fine since.  He denies shortness of breath, chest pain, syncope.    Prior CV studies:   The following studies were reviewed today:  Holter 09/16/17 Sinus rhythm with periods of sinus bradycardia lowest HR 51 bpm Few PVC's, PAC's, and short atrial runs No significant bradycardic events, pathologic pauses > 3 seconds, or sustained arrhythmias Benign Holter  Echo 6/12 EF 60%, mild LVH, normal diastolic function  ETT-Echo 5/12 Normal Past Medical History:  Diagnosis Date  . Anxiety   . Asthma   . Chest pain    Nuclear, 2006, normal, / stress echo, June, 2012, normal, EF 60%  . Diabetes mellitus   . Diverticular disease   . Ejection fraction    EF 60%, echo, June, 2012  . Fatigue     Chronic  . Gout   . Hyperlipemia   . Hypertension   . Overweight(278.02)   . Palpitations    30 day event recorder, 2006, negative  . Sleep apnea    Dr. Halford Chessman  . Tachycardia    January, 2013  . Tobacco abuse    former  . Urinary retention   . Ventricular bigeminy     Current Medications: Current Meds  Medication Sig  . allopurinol (ZYLOPRIM) 100 MG tablet Take 100 mg by mouth daily.  Marland Kitchen ALPRAZolam (XANAX) 0.5 MG tablet Take 0.5 mg by mouth 3 (three) times daily.   Marland Kitchen amitriptyline (ELAVIL) 10 MG tablet Take 30 mg by mouth at bedtime.   Marland Kitchen atenolol (TENORMIN) 25 MG tablet Take 1 tablet (25 mg total) by mouth daily.  . cephALEXin (KEFLEX) 500 MG capsule Take 1 capsule (500 mg total) by mouth 2 (two) times daily. (Rx void after 10/07/17)  . colchicine 0.6 MG tablet Take 0.6 mg by mouth daily. As needed for gout flare.  Mariane Baumgarten Calcium (STOOL SOFTENER PO) Take 1-2 tablets by mouth daily as needed (constipation).   . indomethacin (INDOCIN) 25 MG capsule Take 25 mg by mouth daily as needed for mild pain. If needed for gout  . RAPAFLO 8 MG CAPS capsule Take 8 mg by mouth daily.     Allergies:  Metoprolol; Toprol xl [metoprolol succinate]; Other; Sulfamethoxazole-trimethoprim; Wheat; Azithromycin; Ciprofloxacin; Wheat bran; and Zithromax [azithromycin dihydrate]   Social History   Tobacco Use  . Smoking status: Former Research scientist (life sciences)  . Smokeless tobacco: Never Used  . Tobacco comment: Only smoked x 1 year in high school.  Substance Use Topics  . Alcohol use: No    Alcohol/week: 0.0 oz  . Drug use: No     ROS:   Please see the history of present illness.    Review of Systems  Constitution: Positive for decreased appetite.  Cardiovascular: Positive for irregular heartbeat.  Genitourinary: Positive for incomplete emptying.  Psychiatric/Behavioral: The patient is nervous/anxious.    All other systems reviewed and are negative.   EKGs/Labs/Other Test Reviewed:    EKG:  EKG is  not ordered today.    Recent Labs: 09/11/2017: BUN 10; Creatinine, Ser 0.90; Magnesium 2.0; Potassium 4.4; Sodium 143   Recent Lipid Panel No results found for: CHOL, TRIG, HDL, CHOLHDL, LDLCALC, LDLDIRECT  Physical Exam:    VS:  BP 108/72   Pulse 60   Ht 5\' 9"  (1.753 m)   Wt 267 lb 7.2 oz (121.3 kg)   SpO2 94%   BMI 39.50 kg/m     Wt Readings from Last 3 Encounters:  10/13/17 267 lb 7.2 oz (121.3 kg)  09/29/17 260 lb (117.9 kg)  09/11/17 264 lb 6.4 oz (119.9 kg)     Physical Exam  Constitutional: He is oriented to person, place, and time. He appears well-developed and well-nourished. No distress.  HENT:  Head: Normocephalic and atraumatic.  Neck: No JVD present.  Cardiovascular: Normal rate and regular rhythm.  No murmur heard. Pulmonary/Chest: Effort normal. He has no rales.  Abdominal: Soft.  Musculoskeletal: He exhibits no edema.  Neurological: He is alert and oriented to person, place, and time.  Skin: Skin is warm and dry.    ASSESSMENT & PLAN:    1. Palpitations  He had symptomatic PVCs.  This resolved with one dose of Bisoprolol.  He was changed back to Atenolol several days later when it was noted his HR was lower.  He has not had any further palpitations.  He is not really interested in scheduling a stress test.  His Holter showed < 1% PVCs.  I did suggest he have an echocardiogram to rule out structural heart disease.  He will schedule this at his convenience.  We discussed taking an extra 1/2 of an Atenolol if he has recurrent palpitations.  He should be careful with caffeine.  Of note, he has a lot of stress in his life and this is likely helping contribute to his PVCs.   Dispo:  Return in about 1 year (around 10/13/2018) for Routine Follow Up, w/ Dr. Burt Knack, or Richardson Dopp, PA-C.   Medication Adjustments/Labs and Tests Ordered: Current medicines are reviewed at length with the patient today.  Concerns regarding medicines are outlined above.  Tests  Ordered: No orders of the defined types were placed in this encounter.  Medication Changes: No orders of the defined types were placed in this encounter.   Signed, Richardson Dopp, PA-C  10/13/2017 [facility] [address], [place], [place]  [address] Phone: [phone]; Fax: [phone]

## 2017-10-13 NOTE — Patient Instructions (Signed)
Medication Instructions:  Your physician recommends that you continue on your current medications as directed. Please refer to the Current Medication list given to you today.   Labwork: NONE ORDERED TODAY  Testing/Procedures: CALL IN THE NEXT FEW WEEKS SO THAT WE MAY GO AHEAD AND SCHEDULED YOUR ECHOCARDIOGRAM  Follow-Up: Your physician wants you to follow-up in: [place], PAC OR DR. Burt Knack. You will receive a reminder letter in the mail two months in advance. If you don't receive a letter, please call our office to schedule the follow-up appointment.   Any Other Special Instructions Will Be Listed Below (If Applicable).     If you need a refill on your cardiac medications before your next appointment, please call your pharmacy.

## 2017-10-14 ENCOUNTER — Telehealth (HOSPITAL_COMMUNITY): Payer: Self-pay | Admitting: Cardiovascular Disease

## 2017-10-14 NOTE — Telephone Encounter (Signed)
09/22/17 patient called to cancel test due to lack of power (WEATHER). EVD 09/29/17 Called patient and spoke with him, he needs wait a while b/c he is having a urine culture done..RG 10/03/17 Cell- VM disconnected, Msg left on home phone. BDM 10/08/17 LMOM on home phone - Will mail letter. EVD  User: Cherie Dark A Date/time: 09/29/2017 2:39 PM  Comment: Called patient and spoke with him, he needs wait a while b/c he is having a urine culture done..RG  Context: [place] Schedule Orders/Appt Requests Outcome: Completed  Phone number: [phone] Phone Type: Home Phone  Comm. type: Telephone Call type: Outgoing  Contact: Cheryll Cockayne C Relation to patient: Self  Letter:

## 2017-10-21 DIAGNOSIS — R339 Retention of urine, unspecified: Secondary | ICD-10-CM | POA: Diagnosis not present

## 2017-10-24 DIAGNOSIS — R339 Retention of urine, unspecified: Secondary | ICD-10-CM | POA: Diagnosis not present

## 2017-10-27 DIAGNOSIS — R339 Retention of urine, unspecified: Secondary | ICD-10-CM | POA: Diagnosis not present

## 2017-11-21 DIAGNOSIS — L304 Erythema intertrigo: Secondary | ICD-10-CM | POA: Diagnosis not present

## 2017-11-21 DIAGNOSIS — R339 Retention of urine, unspecified: Secondary | ICD-10-CM | POA: Diagnosis not present

## 2017-11-22 DIAGNOSIS — J029 Acute pharyngitis, unspecified: Secondary | ICD-10-CM | POA: Diagnosis not present

## 2017-12-04 DIAGNOSIS — L304 Erythema intertrigo: Secondary | ICD-10-CM | POA: Diagnosis not present

## 2017-12-04 DIAGNOSIS — L309 Dermatitis, unspecified: Secondary | ICD-10-CM | POA: Diagnosis not present

## 2017-12-04 DIAGNOSIS — K6289 Other specified diseases of anus and rectum: Secondary | ICD-10-CM | POA: Diagnosis not present

## 2017-12-15 DIAGNOSIS — B356 Tinea cruris: Secondary | ICD-10-CM | POA: Diagnosis not present

## 2017-12-15 DIAGNOSIS — N401 Enlarged prostate with lower urinary tract symptoms: Secondary | ICD-10-CM | POA: Diagnosis not present

## 2017-12-18 DIAGNOSIS — R339 Retention of urine, unspecified: Secondary | ICD-10-CM | POA: Diagnosis not present

## 2017-12-23 DIAGNOSIS — L304 Erythema intertrigo: Secondary | ICD-10-CM | POA: Diagnosis not present

## 2017-12-23 DIAGNOSIS — R339 Retention of urine, unspecified: Secondary | ICD-10-CM | POA: Diagnosis not present

## 2017-12-23 DIAGNOSIS — L309 Dermatitis, unspecified: Secondary | ICD-10-CM | POA: Diagnosis not present

## 2017-12-24 DIAGNOSIS — R339 Retention of urine, unspecified: Secondary | ICD-10-CM | POA: Diagnosis not present

## 2017-12-26 DIAGNOSIS — N3090 Cystitis, unspecified without hematuria: Secondary | ICD-10-CM | POA: Diagnosis not present

## 2017-12-26 DIAGNOSIS — R300 Dysuria: Secondary | ICD-10-CM | POA: Diagnosis not present

## 2017-12-26 DIAGNOSIS — R509 Fever, unspecified: Secondary | ICD-10-CM | POA: Diagnosis not present

## 2017-12-28 DIAGNOSIS — K5730 Diverticulosis of large intestine without perforation or abscess without bleeding: Secondary | ICD-10-CM | POA: Diagnosis not present

## 2017-12-28 DIAGNOSIS — N3000 Acute cystitis without hematuria: Secondary | ICD-10-CM | POA: Diagnosis not present

## 2017-12-28 DIAGNOSIS — R509 Fever, unspecified: Secondary | ICD-10-CM | POA: Diagnosis not present

## 2017-12-28 DIAGNOSIS — R109 Unspecified abdominal pain: Secondary | ICD-10-CM | POA: Diagnosis not present

## 2017-12-29 DIAGNOSIS — N138 Other obstructive and reflux uropathy: Secondary | ICD-10-CM | POA: Diagnosis not present

## 2017-12-29 DIAGNOSIS — N390 Urinary tract infection, site not specified: Secondary | ICD-10-CM | POA: Diagnosis not present

## 2017-12-29 DIAGNOSIS — R339 Retention of urine, unspecified: Secondary | ICD-10-CM | POA: Diagnosis not present

## 2017-12-29 DIAGNOSIS — N401 Enlarged prostate with lower urinary tract symptoms: Secondary | ICD-10-CM | POA: Diagnosis not present

## 2018-01-05 DIAGNOSIS — R972 Elevated prostate specific antigen [PSA]: Secondary | ICD-10-CM | POA: Diagnosis not present

## 2018-01-05 DIAGNOSIS — R338 Other retention of urine: Secondary | ICD-10-CM | POA: Diagnosis not present

## 2018-01-05 DIAGNOSIS — B356 Tinea cruris: Secondary | ICD-10-CM | POA: Diagnosis not present

## 2018-01-13 DIAGNOSIS — R972 Elevated prostate specific antigen [PSA]: Secondary | ICD-10-CM | POA: Diagnosis not present

## 2018-01-13 DIAGNOSIS — N390 Urinary tract infection, site not specified: Secondary | ICD-10-CM | POA: Diagnosis not present

## 2018-01-13 DIAGNOSIS — R339 Retention of urine, unspecified: Secondary | ICD-10-CM | POA: Diagnosis not present

## 2018-01-14 DIAGNOSIS — R339 Retention of urine, unspecified: Secondary | ICD-10-CM | POA: Diagnosis not present

## 2018-01-15 DIAGNOSIS — R972 Elevated prostate specific antigen [PSA]: Secondary | ICD-10-CM | POA: Diagnosis not present

## 2018-01-15 DIAGNOSIS — N3000 Acute cystitis without hematuria: Secondary | ICD-10-CM | POA: Diagnosis not present

## 2018-01-15 DIAGNOSIS — B9620 Unspecified Escherichia coli [E. coli] as the cause of diseases classified elsewhere: Secondary | ICD-10-CM | POA: Diagnosis not present

## 2018-01-15 DIAGNOSIS — N390 Urinary tract infection, site not specified: Secondary | ICD-10-CM | POA: Diagnosis not present

## 2018-01-23 DIAGNOSIS — T83511D Infection and inflammatory reaction due to indwelling urethral catheter, subsequent encounter: Secondary | ICD-10-CM | POA: Diagnosis not present

## 2018-01-23 DIAGNOSIS — R339 Retention of urine, unspecified: Secondary | ICD-10-CM | POA: Diagnosis not present

## 2018-01-23 DIAGNOSIS — R509 Fever, unspecified: Secondary | ICD-10-CM | POA: Diagnosis not present

## 2018-02-11 DIAGNOSIS — L308 Other specified dermatitis: Secondary | ICD-10-CM | POA: Diagnosis not present

## 2018-02-11 DIAGNOSIS — L304 Erythema intertrigo: Secondary | ICD-10-CM | POA: Diagnosis not present

## 2018-02-16 DIAGNOSIS — R972 Elevated prostate specific antigen [PSA]: Secondary | ICD-10-CM | POA: Diagnosis not present

## 2018-02-16 DIAGNOSIS — R339 Retention of urine, unspecified: Secondary | ICD-10-CM | POA: Diagnosis not present

## 2018-02-23 DIAGNOSIS — R339 Retention of urine, unspecified: Secondary | ICD-10-CM | POA: Diagnosis not present

## 2018-03-05 DIAGNOSIS — R3914 Feeling of incomplete bladder emptying: Secondary | ICD-10-CM | POA: Diagnosis not present

## 2018-03-09 DIAGNOSIS — N3000 Acute cystitis without hematuria: Secondary | ICD-10-CM | POA: Diagnosis not present

## 2018-03-09 DIAGNOSIS — R972 Elevated prostate specific antigen [PSA]: Secondary | ICD-10-CM | POA: Diagnosis not present

## 2018-03-09 DIAGNOSIS — N390 Urinary tract infection, site not specified: Secondary | ICD-10-CM | POA: Diagnosis not present

## 2018-03-09 DIAGNOSIS — R8271 Bacteriuria: Secondary | ICD-10-CM | POA: Diagnosis not present

## 2018-04-03 DIAGNOSIS — R339 Retention of urine, unspecified: Secondary | ICD-10-CM | POA: Diagnosis not present

## 2018-04-21 DIAGNOSIS — N320 Bladder-neck obstruction: Secondary | ICD-10-CM | POA: Diagnosis not present

## 2018-04-21 DIAGNOSIS — R8271 Bacteriuria: Secondary | ICD-10-CM | POA: Diagnosis not present

## 2018-04-21 DIAGNOSIS — R338 Other retention of urine: Secondary | ICD-10-CM | POA: Diagnosis not present

## 2018-05-06 DIAGNOSIS — I499 Cardiac arrhythmia, unspecified: Secondary | ICD-10-CM | POA: Diagnosis not present

## 2018-05-06 DIAGNOSIS — R972 Elevated prostate specific antigen [PSA]: Secondary | ICD-10-CM | POA: Diagnosis not present

## 2018-05-06 DIAGNOSIS — R339 Retention of urine, unspecified: Secondary | ICD-10-CM | POA: Diagnosis not present

## 2018-05-11 ENCOUNTER — Emergency Department (HOSPITAL_COMMUNITY)
Admission: EM | Admit: 2018-05-11 | Discharge: 2018-05-11 | Disposition: A | Discharge status: Home / Self Care | Payer: 59 | Attending: Emergency Medicine | Admitting: Emergency Medicine

## 2018-05-11 ENCOUNTER — Other Ambulatory Visit: Payer: Self-pay

## 2018-05-11 ENCOUNTER — Encounter (HOSPITAL_COMMUNITY): Payer: Self-pay | Admitting: Emergency Medicine

## 2018-05-11 DIAGNOSIS — Z87891 Personal history of nicotine dependence: Secondary | ICD-10-CM | POA: Insufficient documentation

## 2018-05-11 DIAGNOSIS — J45909 Unspecified asthma, uncomplicated: Secondary | ICD-10-CM | POA: Insufficient documentation

## 2018-05-11 DIAGNOSIS — N390 Urinary tract infection, site not specified: Secondary | ICD-10-CM | POA: Insufficient documentation

## 2018-05-11 DIAGNOSIS — R339 Retention of urine, unspecified: Secondary | ICD-10-CM | POA: Diagnosis present

## 2018-05-11 DIAGNOSIS — E119 Type 2 diabetes mellitus without complications: Secondary | ICD-10-CM | POA: Insufficient documentation

## 2018-05-11 DIAGNOSIS — Z79899 Other long term (current) drug therapy: Secondary | ICD-10-CM | POA: Insufficient documentation

## 2018-05-11 DIAGNOSIS — I10 Essential (primary) hypertension: Secondary | ICD-10-CM | POA: Insufficient documentation

## 2018-05-11 LAB — URINALYSIS, ROUTINE W REFLEX MICROSCOPIC
Bilirubin Urine: NEGATIVE
Glucose, UA: NEGATIVE mg/dL
Hgb urine dipstick: NEGATIVE
KETONES UR: NEGATIVE mg/dL
Nitrite: NEGATIVE
PH: 6 (ref 5.0–8.0)
PROTEIN: NEGATIVE mg/dL
Specific Gravity, Urine: 1.013 (ref 1.005–1.030)

## 2018-05-11 MED ORDER — CEPHALEXIN 250 MG PO CAPS
250.0000 mg | ORAL_CAPSULE | Freq: Once | ORAL | Status: AC
  Administered 2018-05-11: 250 mg via ORAL
  Filled 2018-05-11: qty 1

## 2018-05-11 MED ORDER — CEPHALEXIN 500 MG PO CAPS: 500.0000 mg | ORAL_CAPSULE | Freq: Three times a day (TID) | ORAL | 0 refills | Status: DC

## 2018-05-11 NOTE — ED Triage Notes (Signed)
Pt self caths at home; noticed less output past two days.

## 2018-05-11 NOTE — Discharge Instructions (Addendum)
Take all antibiotics as prescribed Recheck with your urologist in the next 1 to 2 days Return if your symptoms are worse, especially fever or worsening pain

## 2018-05-11 NOTE — ED Provider Notes (Signed)
[facility] DEPT Provider Note   CSN: [id number] Arrival date & time: 05/11/18  0909     History   Chief Complaint Chief Complaint  Patient presents with  . Urinary Retention    HPI Craig [name] is a 62 y.o. male.  HPI  62 year old male history of [name], Craig [name], Craig [name] that he felt like he might have some decreased stream yesterday.  Secondary to this he decreased his p.o. intake in the afternoons Craig that he would not have problems during the night.  He [name] he woke this morning Craig catheterized urine had only 60 mL's.  He still felt like he needed to void Craig we catheterized Craig still had a small amount of fluids.  He [name] some discoloration.  Denies any fever, chills, nausea, vomiting, or diarrhea.  Urologist is Dr. Karsten Ro.  Past Medical History:  Diagnosis Date  . [name]   . Asthma   . Chest pain    Nuclear, 2006, normal, / stress echo, June, 2012, normal, EF 60%  . Craig [name] mellitus   . Diverticular disease   . Ejection fraction    EF 60%, echo, June, 2012  . Fatigue    Chronic  . Gout   . Hyperlipemia   . Hypertension   . Overweight(278.02)   . Palpitations    30 day event recorder, 2006, negative  . Sleep apnea    Dr. Halford Chessman  . Tachycardia    January, 2013  . Tobacco abuse    former  . Urinary retention   . Ventricular bigeminy     Patient Active Problem List   Diagnosis Date Noted  . Tachycardia   . Ejection fraction   . Palpitations   . Chest pain   . Sleep apnea   . Gout   . [name]   . Tobacco abuse   . Craig [name] mellitus   . Asthma   . Diverticular disease   . Overweight(278.02)   . Fatigue     Past Surgical History:  Procedure Laterality Date  . SHOULDER SURGERY     high school        Home Medications    Prior to Admission medications   Medication Sig Start Date End Date Taking? Authorizing Provider  allopurinol  (ZYLOPRIM) 100 MG tablet Take 100 mg by mouth daily.    [provider]  ALPRAZolam Duanne Moron) 0.5 MG tablet Take 0.5 mg by mouth 3 (three) times daily.     [provider]  amitriptyline (ELAVIL) 10 MG tablet Take 30 mg by mouth at bedtime.  08/23/16   [provider]  atenolol (TENORMIN) 25 MG tablet Take 1 tablet (25 mg total) by mouth daily. 09/12/17 09/07/18  Sherren Mocha, MD  cephALEXin (KEFLEX) 500 MG capsule Take 1 capsule (500 mg total) by mouth 2 (two) times daily. (Rx void after 10/07/17) 09/29/17   Kandra Nicolas, MD  colchicine 0.6 MG tablet Take 0.6 mg by mouth daily. As needed for gout flare.    [provider]  Docusate Calcium (STOOL SOFTENER PO) Take 1-2 tablets by mouth daily as needed (constipation).     [provider]  indomethacin (INDOCIN) 25 MG capsule Take 25 mg by mouth daily as needed for mild pain. If needed for gout    [provider]  RAPAFLO 8 MG CAPS capsule Take 8 mg by mouth daily. 09/19/16   [provider]  Family History Family History  Problem Relation Age of Onset  . Hypertension Mother   . Diverticulosis Sister   . Diverticulosis Maternal Grandfather     Social History Social History   Tobacco Use  . Smoking status: Former Research scientist (life sciences)  . Smokeless tobacco: Never Used  . Tobacco comment: Only smoked x 1 year in high school.  Substance Use Topics  . Alcohol use: No    Alcohol/week: 0.0 standard drinks  . Drug use: No     Allergies   Metoprolol; Toprol xl [metoprolol succinate]; Other; Sulfamethoxazole-trimethoprim; Wheat; Azithromycin; Ciprofloxacin; Wheat bran; Craig Zithromax [azithromycin dihydrate]   Review of Systems Review of Systems  All other systems reviewed Craig are negative.    Physical Exam Updated Vital Signs BP 124/75   Pulse 76   Temp (!) 97.4 F (36.3 C) (Oral)   Resp 15   SpO2 100%   Physical Exam  Constitutional: He appears well-developed Craig  well-nourished.  HENT:  Head: Normocephalic Craig atraumatic.  Eyes: Pupils are equal, round, Craig reactive to light. EOM are normal.  disconjugate gaze at rest  Neck: Normal range of motion. Neck supple.  Cardiovascular: Normal rate Craig regular rhythm.  Pulmonary/Chest: Effort normal Craig breath sounds normal.  Abdominal: Soft. Bowel sounds are normal.  No distention or tenderness  Musculoskeletal: Normal range of motion.  Neurological: He is alert.  Skin: Skin is warm.  Nursing note Craig vitals reviewed.    ED Treatments / Results  Labs (all labs ordered are listed, but only abnormal results are displayed) Labs Reviewed  URINALYSIS, ROUTINE W REFLEX MICROSCOPIC    EKG None  Radiology No results found.  Procedures Procedures (including critical care time)  Medications Ordered in ED Medications - No data to display   Initial Impression / Assessment Craig Plan / ED Course  I have reviewed the triage vital signs Craig the nursing notes.  Pertinent labs & imaging results that were available during my care of the patient were reviewed by me Craig considered in my medical decision making (see chart for details).    Discussed results Craig plan with patient   Final Clinical Impressions(s) / ED Diagnoses   Final diagnoses:  Lower urinary tract infectious disease    ED Discharge Orders         Ordered    cephALEXin (KEFLEX) 500 MG capsule  3 times daily     05/11/18 1042           Pattricia Boss, MD 05/11/18 1050

## 2018-05-12 LAB — URINE CULTURE: Culture: <10000 — AB

## 2018-05-18 DIAGNOSIS — R339 Retention of urine, unspecified: Secondary | ICD-10-CM | POA: Diagnosis not present

## 2018-05-19 DIAGNOSIS — R338 Other retention of urine: Secondary | ICD-10-CM | POA: Diagnosis not present

## 2018-05-20 DIAGNOSIS — R339 Retention of urine, unspecified: Secondary | ICD-10-CM | POA: Diagnosis not present

## 2018-05-21 DIAGNOSIS — R1030 Lower abdominal pain, unspecified: Secondary | ICD-10-CM | POA: Diagnosis not present

## 2018-05-21 DIAGNOSIS — L304 Erythema intertrigo: Secondary | ICD-10-CM | POA: Diagnosis not present

## 2018-05-25 DIAGNOSIS — R338 Other retention of urine: Secondary | ICD-10-CM | POA: Diagnosis not present

## 2018-05-25 DIAGNOSIS — B9689 Other specified bacterial agents as the cause of diseases classified elsewhere: Secondary | ICD-10-CM | POA: Diagnosis not present

## 2018-05-25 DIAGNOSIS — R350 Frequency of micturition: Secondary | ICD-10-CM | POA: Diagnosis not present

## 2018-05-25 DIAGNOSIS — N390 Urinary tract infection, site not specified: Secondary | ICD-10-CM | POA: Diagnosis not present

## 2018-05-27 DIAGNOSIS — R339 Retention of urine, unspecified: Secondary | ICD-10-CM | POA: Diagnosis not present

## 2018-05-27 DIAGNOSIS — R972 Elevated prostate specific antigen [PSA]: Secondary | ICD-10-CM | POA: Diagnosis not present

## 2018-05-27 DIAGNOSIS — N390 Urinary tract infection, site not specified: Secondary | ICD-10-CM | POA: Diagnosis not present

## 2018-06-12 DIAGNOSIS — N319 Neuromuscular dysfunction of bladder, unspecified: Secondary | ICD-10-CM | POA: Diagnosis not present

## 2018-06-12 DIAGNOSIS — R339 Retention of urine, unspecified: Secondary | ICD-10-CM | POA: Diagnosis not present

## 2018-06-12 DIAGNOSIS — N390 Urinary tract infection, site not specified: Secondary | ICD-10-CM | POA: Diagnosis not present

## 2018-06-16 DIAGNOSIS — R339 Retention of urine, unspecified: Secondary | ICD-10-CM | POA: Diagnosis not present

## 2018-06-21 DIAGNOSIS — R339 Retention of urine, unspecified: Secondary | ICD-10-CM | POA: Diagnosis not present

## 2018-06-23 DIAGNOSIS — R509 Fever, unspecified: Secondary | ICD-10-CM | POA: Diagnosis not present

## 2018-06-23 DIAGNOSIS — R358 Other polyuria: Secondary | ICD-10-CM | POA: Diagnosis not present

## 2018-06-23 DIAGNOSIS — R110 Nausea: Secondary | ICD-10-CM | POA: Diagnosis not present

## 2018-06-25 DIAGNOSIS — R338 Other retention of urine: Secondary | ICD-10-CM | POA: Diagnosis not present

## 2018-06-26 DIAGNOSIS — R338 Other retention of urine: Secondary | ICD-10-CM | POA: Diagnosis not present

## 2018-06-29 DIAGNOSIS — R339 Retention of urine, unspecified: Secondary | ICD-10-CM | POA: Diagnosis not present

## 2018-07-01 DIAGNOSIS — R339 Retention of urine, unspecified: Secondary | ICD-10-CM | POA: Diagnosis not present

## 2018-07-01 DIAGNOSIS — R972 Elevated prostate specific antigen [PSA]: Secondary | ICD-10-CM | POA: Diagnosis not present

## 2018-07-01 DIAGNOSIS — N401 Enlarged prostate with lower urinary tract symptoms: Secondary | ICD-10-CM | POA: Diagnosis not present

## 2018-07-08 ENCOUNTER — Ambulatory Visit: Payer: 59 | Admitting: Mental Health

## 2018-07-08 DIAGNOSIS — F331 Major depressive disorder, recurrent, moderate: Secondary | ICD-10-CM

## 2018-07-08 NOTE — Progress Notes (Signed)
      [facility] Counselor/Therapist Progress Note   Patient ID: Craig [name], MRN: [id number]  Date: 07/08/2018  Timespent: 45 minutes   Treatment Type: Individual   Reported Symptoms: Feelings of Worthlessness, Hopelessness, Obsessive thinking, Fatigue and chronic fatigue   Mental Status Exam:    Appearance:   Casual     Behavior:  Blaming and Agitated  Motor:  Shuffling Gait  Speech/Language:   Clear and Coherent  Affect:  Depressed  Mood:  angry, anxious and depressed  Thought process:  normal  Thought content:    WNL  Sensory/Perceptual disturbances:    WNL  Orientation:  oriented to person, place, time/date and situation  Attention:  Good  Concentration:  Fair  Memory:  impaired  Fund of knowledge:   Good  Insight:    Good  Judgment:   Good  Impulse Control:  Fair     Risk Assessment: Danger to Self:  No Self-injurious Behavior: No Danger to Others: No Duty to Warn:no Physical Aggression / Violence:No  Access to Firearms a concern: No  Gang Involvement:No    Subjective: Marital dissatisfaction as wife continues to shame him. Upset about daughter and husband moving to [facility]. Worried about assortment of health concerns. Problem adjusting to urinary bag attached to leg. Considering surgery.   Interventions: Cognitive Behavioral Therapy    Diagnosis:   ICD-10-CM   1. Major depressive disorder, recurrent episode, moderate (HCC) F33.1                          Plan:  Improve marital communication            Transition to wife's retirement             Continue self care program and considering surgical option             Return to office as needed    Rosary Lively, [facility]

## 2018-07-15 DIAGNOSIS — R339 Retention of urine, unspecified: Secondary | ICD-10-CM | POA: Diagnosis not present

## 2018-07-16 DIAGNOSIS — N320 Bladder-neck obstruction: Secondary | ICD-10-CM | POA: Diagnosis not present

## 2018-07-16 DIAGNOSIS — N342 Other urethritis: Secondary | ICD-10-CM | POA: Diagnosis not present

## 2018-07-16 DIAGNOSIS — N390 Urinary tract infection, site not specified: Secondary | ICD-10-CM | POA: Diagnosis not present

## 2018-07-30 DIAGNOSIS — Z0100 Encounter for examination of eyes and vision without abnormal findings: Secondary | ICD-10-CM | POA: Diagnosis not present

## 2018-08-21 ENCOUNTER — Other Ambulatory Visit: Payer: Self-pay | Admitting: Cardiovascular Disease

## 2018-08-25 DIAGNOSIS — N401 Enlarged prostate with lower urinary tract symptoms: Secondary | ICD-10-CM | POA: Diagnosis not present

## 2018-08-25 DIAGNOSIS — R339 Retention of urine, unspecified: Secondary | ICD-10-CM | POA: Diagnosis not present

## 2018-08-25 DIAGNOSIS — N390 Urinary tract infection, site not specified: Secondary | ICD-10-CM | POA: Diagnosis not present

## 2018-08-28 DIAGNOSIS — R338 Other retention of urine: Secondary | ICD-10-CM | POA: Diagnosis not present

## 2018-08-28 DIAGNOSIS — R339 Retention of urine, unspecified: Secondary | ICD-10-CM | POA: Diagnosis not present

## 2018-08-28 DIAGNOSIS — N390 Urinary tract infection, site not specified: Secondary | ICD-10-CM | POA: Diagnosis not present

## 2018-08-28 DIAGNOSIS — R319 Hematuria, unspecified: Secondary | ICD-10-CM | POA: Diagnosis not present

## 2018-09-04 DIAGNOSIS — N138 Other obstructive and reflux uropathy: Secondary | ICD-10-CM | POA: Diagnosis not present

## 2018-09-04 DIAGNOSIS — R339 Retention of urine, unspecified: Secondary | ICD-10-CM | POA: Diagnosis not present

## 2018-09-04 DIAGNOSIS — Z87440 Personal history of urinary (tract) infections: Secondary | ICD-10-CM | POA: Diagnosis not present

## 2018-09-04 DIAGNOSIS — N401 Enlarged prostate with lower urinary tract symptoms: Secondary | ICD-10-CM | POA: Diagnosis not present

## 2018-09-08 DIAGNOSIS — R339 Retention of urine, unspecified: Secondary | ICD-10-CM | POA: Diagnosis not present

## 2018-09-11 ENCOUNTER — Ambulatory Visit: Payer: 59 | Admitting: Mental Health

## 2018-09-14 DIAGNOSIS — R338 Other retention of urine: Secondary | ICD-10-CM | POA: Diagnosis not present

## 2018-10-01 DIAGNOSIS — R3914 Feeling of incomplete bladder emptying: Secondary | ICD-10-CM | POA: Diagnosis not present

## 2018-10-01 DIAGNOSIS — R8271 Bacteriuria: Secondary | ICD-10-CM | POA: Diagnosis not present

## 2018-10-08 ENCOUNTER — Ambulatory Visit: Payer: 59 | Admitting: Mental Health

## 2018-10-08 ENCOUNTER — Encounter: Payer: Self-pay | Admitting: Mental Health

## 2018-10-08 DIAGNOSIS — F331 Major depressive disorder, recurrent, moderate: Secondary | ICD-10-CM | POA: Diagnosis not present

## 2018-10-08 DIAGNOSIS — F411 Generalized anxiety disorder: Secondary | ICD-10-CM

## 2018-10-08 DIAGNOSIS — Z6836 Body mass index (BMI) 36.0-36.9, adult: Secondary | ICD-10-CM | POA: Diagnosis not present

## 2018-10-08 DIAGNOSIS — Z1389 Encounter for screening for other disorder: Secondary | ICD-10-CM | POA: Diagnosis not present

## 2018-10-08 DIAGNOSIS — I10 Essential (primary) hypertension: Secondary | ICD-10-CM | POA: Diagnosis not present

## 2018-10-08 DIAGNOSIS — N411 Chronic prostatitis: Secondary | ICD-10-CM | POA: Diagnosis not present

## 2018-10-08 DIAGNOSIS — N401 Enlarged prostate with lower urinary tract symptoms: Secondary | ICD-10-CM | POA: Diagnosis not present

## 2018-10-08 NOTE — Progress Notes (Signed)
      [facility] Counselor/Therapist Progress Note  Patient ID: Craig [name], MRN: [id number],    Date: 10/08/2018  Time Spent: 45 minutes  Treatment Type: Individual Therapy  Reported Symptoms: Depressed mood, Anxious Mood, Anhedonia, Ruminations, Irritability, Fatigue, Concentration impairments and Sleep disturbance  Mental Status Exam:  Appearance:   Casual     Behavior:  Appropriate  Motor:  Shuffling Gait  Speech/Language:   Normal Rate  Affect:  Negative and Depressed  Mood:  anxious, depressed, irritable, labile and sad  Thought process:  normal  Thought content:    Obsessions  Sensory/Perceptual disturbances:    WNL  Orientation:  oriented to person, place and time/date  Attention:  Fair  Concentration:  Fair  Memory:  WNL  Fund of knowledge:   Good  Insight:    Good  Judgment:   Good  Impulse Control:  Fair   Risk Assessment: Danger to Self:  No Self-injurious Behavior: No Danger to Others: No Duty to Warn:no Physical Aggression / Violence:No  Access to Firearms a concern: No  Gang Involvement:No   Subjective: Anxious and depressed about health problems involving urinary tract. Still obsessing about old issues. Daughter has not moved to [facility] yet. Stressed and anxious. Wife recently retired. Has an offer on another house and may relocate.  Interventions: Cognitive Behavioral Therapy, Solution-Oriented/Positive Psychology, Insight-Oriented and Interpersonal  Diagnosis:   ICD-10-CM   1. Major depressive disorder, recurrent episode, moderate (HCC) F33.1   2. Generalized anxiety disorder F41.1     Plan: Self care problems           Decrease obsessive thinking           Lower anxiety and depressive symptoms           Organize to sell house and relocate           Validation and support  Rosary Lively, [facility]

## 2018-10-14 DIAGNOSIS — R339 Retention of urine, unspecified: Secondary | ICD-10-CM | POA: Diagnosis not present

## 2018-11-02 DIAGNOSIS — R339 Retention of urine, unspecified: Secondary | ICD-10-CM | POA: Diagnosis not present

## 2018-11-03 DIAGNOSIS — N390 Urinary tract infection, site not specified: Secondary | ICD-10-CM | POA: Diagnosis not present

## 2018-11-09 DIAGNOSIS — R339 Retention of urine, unspecified: Secondary | ICD-10-CM | POA: Diagnosis not present

## 2018-12-03 DIAGNOSIS — F419 Anxiety disorder, unspecified: Secondary | ICD-10-CM | POA: Diagnosis not present

## 2018-12-03 DIAGNOSIS — M109 Gout, unspecified: Secondary | ICD-10-CM | POA: Diagnosis not present

## 2018-12-29 DIAGNOSIS — Z0000 Encounter for general adult medical examination without abnormal findings: Secondary | ICD-10-CM | POA: Diagnosis not present

## 2019-01-11 DIAGNOSIS — R300 Dysuria: Secondary | ICD-10-CM | POA: Diagnosis not present

## 2019-01-11 DIAGNOSIS — B369 Superficial mycosis, unspecified: Secondary | ICD-10-CM | POA: Diagnosis not present

## 2019-01-21 DIAGNOSIS — N390 Urinary tract infection, site not specified: Secondary | ICD-10-CM | POA: Diagnosis not present

## 2019-01-26 ENCOUNTER — Telehealth: Payer: 59 | Admitting: Physician Assistant

## 2019-01-28 DIAGNOSIS — N390 Urinary tract infection, site not specified: Secondary | ICD-10-CM | POA: Diagnosis not present

## 2019-01-28 DIAGNOSIS — N401 Enlarged prostate with lower urinary tract symptoms: Secondary | ICD-10-CM | POA: Diagnosis not present

## 2019-01-28 DIAGNOSIS — N138 Other obstructive and reflux uropathy: Secondary | ICD-10-CM | POA: Diagnosis not present

## 2019-02-10 DIAGNOSIS — R339 Retention of urine, unspecified: Secondary | ICD-10-CM | POA: Diagnosis not present

## 2019-02-12 DIAGNOSIS — N138 Other obstructive and reflux uropathy: Secondary | ICD-10-CM | POA: Diagnosis not present

## 2019-02-12 DIAGNOSIS — N401 Enlarged prostate with lower urinary tract symptoms: Secondary | ICD-10-CM | POA: Diagnosis not present

## 2019-04-23 DIAGNOSIS — N401 Enlarged prostate with lower urinary tract symptoms: Secondary | ICD-10-CM | POA: Diagnosis not present

## 2019-05-14 DIAGNOSIS — N390 Urinary tract infection, site not specified: Secondary | ICD-10-CM | POA: Diagnosis not present

## 2019-05-14 DIAGNOSIS — R339 Retention of urine, unspecified: Secondary | ICD-10-CM | POA: Diagnosis not present

## 2019-05-24 DIAGNOSIS — H6123 Impacted cerumen, bilateral: Secondary | ICD-10-CM | POA: Diagnosis not present

## 2019-06-14 DIAGNOSIS — N401 Enlarged prostate with lower urinary tract symptoms: Secondary | ICD-10-CM | POA: Diagnosis not present

## 2019-06-14 DIAGNOSIS — R339 Retention of urine, unspecified: Secondary | ICD-10-CM | POA: Diagnosis not present

## 2019-06-14 DIAGNOSIS — N390 Urinary tract infection, site not specified: Secondary | ICD-10-CM | POA: Diagnosis not present

## 2019-06-14 DIAGNOSIS — N138 Other obstructive and reflux uropathy: Secondary | ICD-10-CM | POA: Diagnosis not present

## 2019-06-14 DIAGNOSIS — T83511A Infection and inflammatory reaction due to indwelling urethral catheter, initial encounter: Secondary | ICD-10-CM | POA: Diagnosis not present

## 2019-06-23 DIAGNOSIS — R339 Retention of urine, unspecified: Secondary | ICD-10-CM | POA: Diagnosis not present

## 2019-07-05 DIAGNOSIS — H2513 Age-related nuclear cataract, bilateral: Secondary | ICD-10-CM | POA: Diagnosis not present

## 2019-07-05 DIAGNOSIS — H5010 Unspecified exotropia: Secondary | ICD-10-CM | POA: Diagnosis not present

## 2019-07-05 DIAGNOSIS — H43812 Vitreous degeneration, left eye: Secondary | ICD-10-CM | POA: Diagnosis not present

## 2019-08-21 DIAGNOSIS — N390 Urinary tract infection, site not specified: Secondary | ICD-10-CM | POA: Diagnosis not present

## 2019-08-24 DIAGNOSIS — Z87440 Personal history of urinary (tract) infections: Secondary | ICD-10-CM | POA: Diagnosis not present

## 2019-09-13 DIAGNOSIS — R350 Frequency of micturition: Secondary | ICD-10-CM | POA: Diagnosis not present

## 2019-09-14 DIAGNOSIS — R339 Retention of urine, unspecified: Secondary | ICD-10-CM | POA: Diagnosis not present

## 2019-10-01 DIAGNOSIS — M545 Low back pain: Secondary | ICD-10-CM | POA: Diagnosis not present

## 2019-10-01 DIAGNOSIS — M5136 Other intervertebral disc degeneration, lumbar region: Secondary | ICD-10-CM | POA: Diagnosis not present

## 2019-10-13 DIAGNOSIS — R339 Retention of urine, unspecified: Secondary | ICD-10-CM | POA: Diagnosis not present

## 2019-10-13 DIAGNOSIS — N401 Enlarged prostate with lower urinary tract symptoms: Secondary | ICD-10-CM | POA: Diagnosis not present

## 2019-10-13 DIAGNOSIS — N138 Other obstructive and reflux uropathy: Secondary | ICD-10-CM | POA: Diagnosis not present

## 2019-10-15 DIAGNOSIS — H43811 Vitreous degeneration, right eye: Secondary | ICD-10-CM | POA: Diagnosis not present

## 2019-10-15 DIAGNOSIS — H2513 Age-related nuclear cataract, bilateral: Secondary | ICD-10-CM | POA: Diagnosis not present

## 2019-10-15 DIAGNOSIS — H25013 Cortical age-related cataract, bilateral: Secondary | ICD-10-CM | POA: Diagnosis not present

## 2019-10-27 DIAGNOSIS — F419 Anxiety disorder, unspecified: Secondary | ICD-10-CM | POA: Diagnosis not present

## 2019-11-09 DIAGNOSIS — F333 Major depressive disorder, recurrent, severe with psychotic symptoms: Secondary | ICD-10-CM | POA: Diagnosis not present

## 2019-11-09 DIAGNOSIS — R1030 Lower abdominal pain, unspecified: Secondary | ICD-10-CM | POA: Diagnosis not present

## 2020-01-11 DIAGNOSIS — F411 Generalized anxiety disorder: Secondary | ICD-10-CM | POA: Diagnosis not present

## 2020-01-11 DIAGNOSIS — N410 Acute prostatitis: Secondary | ICD-10-CM | POA: Diagnosis not present

## 2020-02-01 ENCOUNTER — Encounter: Payer: Self-pay | Admitting: Cardiovascular Disease

## 2020-02-01 NOTE — Progress Notes (Signed)
Virtual Visit via Telephone Note   This visit type was conducted due to national recommendations for restrictions regarding the COVID-19 Pandemic (e.g. social distancing) in an effort to limit this patient's exposure and mitigate transmission in our community.  Due to his co-morbid illnesses, this patient is at least at moderate risk for complications without adequate follow up.  This format is felt to be most appropriate for this patient at this time.  The patient did not have access to video technology/had technical difficulties with video requiring transitioning to audio format only (telephone).  All issues noted in this document were discussed and addressed.  No physical exam could be performed with this format.  Please refer to the patient's chart for his  consent to telehealth for [facility].   The patient was identified using 2 identifiers.  Date:  02/02/2020   ID:  Craig [name], DOB 09/08/1956, MRN [id number]  Patient Location: Home Provider Location: Office  PCP:  Celene Squibb, MD  Cardiologist:  Sherren Mocha, MD   Electrophysiologist:  None   Evaluation Performed:  Follow-Up Visit  Chief Complaint:  Palpitations   Patient Profile: Craig [name] is a 64 y.o. male with:  Palpitations  Symptomatic PVCs  Hx of syncope (2008)  Hx of ventricular bigeminy   Intol of bisoprolol due to bradycardia   Diabetes mellitus   Hypertension   Hyperlipidemia    Prior CV Studies:  Holter 09/16/17 Sinus rhythm with periods of sinus bradycardia lowest HR 51 bpm Few PVC's, PAC's, and short atrial runs No significant bradycardic events, pathologic pauses > 3 seconds, or sustained arrhythmias Benign Holter  Echo 6/12 EF 60%, mild LVH, normal diastolic function  History of Present Illness:   Craig [name] was last seen in 10/2017.  He is seen for follow up.  He continues to have palpitations a few times a week.  Overall, his symptoms have been controlled with the Atenolol.   He takes an extra Atenolol from time to time to help with his symptoms.  He has a lot of stress in his life and he has a lot of worries about COVID-19.  He has chest pain with emotional stress but no exertional symptoms.  This is a chronic symptom without change.  He does get short of breath with extreme heat.  He has no significant shortness of breath with exertion in cooler temperatures or inside.  He has not had syncope.    Past Medical History:  Diagnosis Date  . Anxiety   . Asthma   . Chest pain    Nuclear, 2006, normal, / stress echo, June, 2012, normal, EF 60%  . Diabetes mellitus   . Diverticular disease   . Ejection fraction    EF 60%, echo, June, 2012  . Fatigue    Chronic  . Gout   . Hyperlipemia   . Hypertension   . Overweight(278.02)   . Palpitations    30 day event recorder, 2006, negative  . Sleep apnea    Dr. Halford Chessman  . Tachycardia    January, 2013  . Tobacco abuse    former  . Urinary retention   . Ventricular bigeminy    Past Surgical History:  Procedure Laterality Date  . SHOULDER SURGERY     high school     Current Meds  Medication Sig  . allopurinol (ZYLOPRIM) 100 MG tablet Take 100 mg by mouth daily.  Marland Kitchen ALPRAZolam (XANAX) 0.5 MG tablet Take 0.5 mg by mouth 3 (  three) times daily.   Marland Kitchen amitriptyline (ELAVIL) 10 MG tablet Take 10 mg by mouth at bedtime.  Marland Kitchen atenolol (TENORMIN) 25 MG tablet TAKE ONE TABLET BY MOUTH ONCE DAILY.  Marland Kitchen colchicine 0.6 MG tablet Take 0.6 mg by mouth daily. As needed for gout flare.  Mariane Baumgarten Calcium (STOOL SOFTENER PO) Take 1-2 tablets by mouth daily as needed (constipation).   . indomethacin (INDOCIN) 25 MG capsule Take 25 mg by mouth daily as needed for mild pain. If needed for gout  . RAPAFLO 8 MG CAPS capsule Take 8 mg by mouth daily.     Allergies:   Metoprolol, Toprol xl [metoprolol succinate], Other, Sulfamethoxazole-trimethoprim, Wheat, Azithromycin, Ciprofloxacin, Wheat bran, and Zithromax [azithromycin dihydrate]    Social History   Tobacco Use  . Smoking status: Former Research scientist (life sciences)  . Smokeless tobacco: Never Used  . Tobacco comment: Only smoked x 1 year in high school.  Substance Use Topics  . Alcohol use: No    Alcohol/week: 0.0 standard drinks  . Drug use: No     Family Hx: The patient's family history includes Diverticulosis in his maternal grandfather and sister; Hypertension in his mother.  ROS:   Please see the history of present illness.     Labs/Other Tests and Data Reviewed:    EKG:  No ECG reviewed.  Recent Labs: No results found for requested labs within last 8760 hours.   Recent Lipid Panel No results found for: CHOL, TRIG, HDL, CHOLHDL, LDLCALC, LDLDIRECT  Wt Readings from Last 3 Encounters:  02/02/20 255 lb (115.7 kg)  10/13/17 267 lb 7.2 oz (121.3 kg)  09/29/17 260 lb (117.9 kg)     Objective:    Vital Signs:  Ht 5\' 9"  (1.753 m)   Wt 255 lb (115.7 kg)   BMI 37.66 kg/m    VITAL SIGNS:  reviewed GEN:  no acute distress RESPIRATORY:  normal respiratory effort PSYCH:  normal affect  ASSESSMENT & PLAN:    1. Palpitations PVC burden in 2019 was < 1% by Holter.  Overall, his palpitations are well controlled on current Rx.  He can take an additional Atenolol as needed for increasing palpitations.  I have asked him to contact us if he feels the frequency is changing.  At that point, we should repeat a monitor to assess PVC burden again.  FU in person in 1 year with an ECG.     Time:   Today, I have spent 18 minutes with the patient with telehealth technology discussing the above problems.     Medication Adjustments/Labs and Tests Ordered: Current medicines are reviewed at length with the patient today.  Concerns regarding medicines are outlined above.   Tests Ordered: No orders of the defined types were placed in this encounter.   Medication Changes: No orders of the defined types were placed in this encounter.   Follow Up:  In Person in 1  year(s)  Signed, Richardson Dopp, PA-C  02/02/2020 5:21 PM    [place]

## 2020-02-02 ENCOUNTER — Telehealth (INDEPENDENT_AMBULATORY_CARE_PROVIDER_SITE_OTHER): Payer: PPO | Admitting: Physician Assistant

## 2020-02-02 ENCOUNTER — Other Ambulatory Visit: Payer: Self-pay

## 2020-02-02 ENCOUNTER — Encounter: Payer: Self-pay | Admitting: Physician Assistant

## 2020-02-02 VITALS — Ht 69.0 in | Wt 255.0 lb

## 2020-02-02 DIAGNOSIS — R002 Palpitations: Secondary | ICD-10-CM

## 2020-02-02 NOTE — Patient Instructions (Signed)
Medication Instructions:   Your physician recommends that you continue on your current medications as directed. Please refer to the Current Medication list given to you today.  *If you need a refill on your cardiac medications before your next appointment, please call your pharmacy*  Lab Work:  None ordered today  If you have labs (blood work) drawn today and your tests are completely normal, you will receive your results only by: Marland Kitchen MyChart Message (if you have MyChart) OR . A paper copy in the mail If you have any lab test that is abnormal or we need to change your treatment, we will call you to review the results.  Testing/Procedures:  None ordered today  Follow-Up: At [facility], you and your health needs are our priority.  As part of our continuing mission to provide you with exceptional heart care, we have created designated Provider Care Teams.  These Care Teams include your primary Cardiologist (physician) and Advanced Practice Providers (APPs -  Physician Assistants and Nurse Practitioners) who all work together to provide you with the care you need, when you need it.  We recommend signing up for the patient portal called "MyChart".  Sign up information is provided on this After Visit Summary.  MyChart is used to connect with patients for Virtual Visits (Telemedicine).  Patients are able to view lab/test results, encounter notes, upcoming appointments, etc.  Non-urgent messages can be sent to your provider as well.   To learn more about what you can do with MyChart, go to [email or web address].    Your next appointment:   12 month(s)  The format for your next appointment:   In Person  Provider:   You may see Sherren Mocha, MD or Richardson Dopp, PA-C

## 2020-02-14 DIAGNOSIS — N401 Enlarged prostate with lower urinary tract symptoms: Secondary | ICD-10-CM | POA: Diagnosis not present

## 2020-02-14 DIAGNOSIS — N138 Other obstructive and reflux uropathy: Secondary | ICD-10-CM | POA: Diagnosis not present

## 2020-02-15 DIAGNOSIS — N410 Acute prostatitis: Secondary | ICD-10-CM | POA: Diagnosis not present

## 2020-02-15 DIAGNOSIS — F411 Generalized anxiety disorder: Secondary | ICD-10-CM | POA: Diagnosis not present

## 2020-02-24 DIAGNOSIS — F419 Anxiety disorder, unspecified: Secondary | ICD-10-CM | POA: Diagnosis not present

## 2020-02-24 DIAGNOSIS — N401 Enlarged prostate with lower urinary tract symptoms: Secondary | ICD-10-CM | POA: Diagnosis not present

## 2020-02-24 DIAGNOSIS — R339 Retention of urine, unspecified: Secondary | ICD-10-CM | POA: Diagnosis not present

## 2020-02-24 DIAGNOSIS — N138 Other obstructive and reflux uropathy: Secondary | ICD-10-CM | POA: Diagnosis not present

## 2020-03-02 ENCOUNTER — Ambulatory Visit: Payer: Self-pay | Admitting: Psychiatry

## 2020-04-26 DIAGNOSIS — L821 Other seborrheic keratosis: Secondary | ICD-10-CM | POA: Diagnosis not present

## 2020-04-26 DIAGNOSIS — L720 Epidermal cyst: Secondary | ICD-10-CM | POA: Diagnosis not present

## 2020-04-26 DIAGNOSIS — D1801 Hemangioma of skin and subcutaneous tissue: Secondary | ICD-10-CM | POA: Diagnosis not present

## 2020-05-03 ENCOUNTER — Other Ambulatory Visit: Payer: Self-pay

## 2020-05-03 ENCOUNTER — Ambulatory Visit (INDEPENDENT_AMBULATORY_CARE_PROVIDER_SITE_OTHER): Payer: PPO | Admitting: Psychiatry

## 2020-05-03 DIAGNOSIS — F331 Major depressive disorder, recurrent, moderate: Secondary | ICD-10-CM

## 2020-05-03 NOTE — Progress Notes (Signed)
[facility] Counselor Initial Adult Exam  Name: Craig [name] Date: 05/03/2020 MRN: [id number] DOB: May 22, 1956 PCP: Celene Squibb, MD  Time spent:  60 minutes 1:00pm to 2:00pm  Guardian/Payee:  patient  Paperwork requested:  No   Reason for Visit /Presenting Problem: anxiety, depression, "bothered by Covid and the world", sadness, lonely  Mental Status Exam:   Appearance:   Casual     Behavior:  Appropriate and Sharing  Motor:  Normal  Speech/Language:   Clear and Coherent  Affect:  anxious, sad, depression  Mood:  anxious, depressed and sad  Thought process:  normal  Thought content:    WNL  Sensory/Perceptual disturbances:    WNL  Orientation:  oriented to person, place, time/date, situation, day of week, month of year and year  Attention:  Fair  Concentration:  Fair  Memory:  forgetfulness  Fund of knowledge:   Good  Insight:    Good  Judgment:   Good  Impulse Control:  Good   Reported Symptoms:   See symptoms above  Risk Assessment: Danger to Self:  No Self-injurious Behavior: No Danger to Others: No Duty to Warn:no Physical Aggression / Violence:No  Access to Firearms a concern: No  Gang Involvement:No  Patient / guardian was educated about steps to take if suicide or homicide risk level increases between visits: Patient denies any SI or HI. While future psychiatric events cannot be accurately predicted, the patient does not currently require acute inpatient psychiatric care and does not currently meet [facility] involuntary commitment criteria.  Substance Abuse History: Current substance abuse: No     Past Psychiatric History:   Previous psychological history is significant for anxiety and depression Outpatient Providers: Rosary Lively (counselor) and others not named History of Psych Hospitalization: No  Psychological Testing: n/a   Abuse History: Victim of No., n/a   Report needed: No. Victim of Neglect:No. Perpetrator of n/a  Witness /  Exposure to Domestic Violence: No   Protective Services Involvement: No  Witness to [employer] Violence:  n/a  Family History: Reviewed with patient and he confirms info below Family History  Problem Relation Age of Onset  . Hypertension Mother   . Diverticulosis Sister   . 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 Stress:  some medical expenses for wife  Income/Employment/Disability: Actor: No   Educational History: Education: some college  Religion/Sprituality/World View:   Protestant  Any cultural differences that may affect / interfere with treatment:  not applicable   Recreation/Hobbies: fishing, classic cars, talking with others  Stressors:Health problems Other: wife's cancer  Strengths:  Supportive Relationships, Family, Spirituality, Hopefulness, Self Advocate and Able to Communicate Effectively  Barriers:  "If something were to happen to my wife or my daughter"   Legal History: Pending legal issue / charges: The patient has no significant history of legal issues. History of legal issue / charges: n/a  Medical History/Surgical History:  Reviewed with patient and he confirms info below EXCEPT denies any history of diabetes nor asthma Past Medical History:  Diagnosis Date  . Anxiety   . Asthma   .  Chest pain    Nuclear, 2006, normal, / stress echo, June, 2012, normal, EF 60%  . Diabetes mellitus   . Diverticular disease   . Ejection fraction    EF 60%, echo, June, 2012  . Fatigue    Chronic  . Gout   . Hyperlipemia   . Hypertension   . Overweight(278.02)   . Palpitations    30 day  event recorder, 2006, negative  . Sleep apnea    Dr. Halford Chessman  . Tachycardia    January, 2013  . Tobacco abuse    former  . Urinary retention   . Ventricular bigeminy     Past Surgical History:  Procedure Laterality Date  . SHOULDER SURGERY     high school    Medications: Reviewed with Patient and he confirms info below. Current Outpatient Medications  Medication Sig Dispense Refill  . allopurinol (ZYLOPRIM) 100 MG tablet Take 100 mg by mouth daily.    Marland Kitchen ALPRAZolam (XANAX) 0.5 MG tablet Take 0.5 mg by mouth 3 (three) times daily.     Marland Kitchen amitriptyline (ELAVIL) 10 MG tablet Take 10 mg by mouth at bedtime.    Marland Kitchen atenolol (TENORMIN) 25 MG tablet TAKE ONE TABLET BY MOUTH ONCE DAILY. 30 tablet 1  . colchicine 0.6 MG tablet Take 0.6 mg by mouth daily. As needed for gout flare.    Mariane Baumgarten Calcium (STOOL SOFTENER PO) Take 1-2 tablets by mouth daily as needed (constipation).     . indomethacin (INDOCIN) 25 MG capsule Take 25 mg by mouth daily as needed for mild pain. If needed for gout    . RAPAFLO 8 MG CAPS capsule Take 8 mg by mouth daily.     No current facility-administered medications for this visit.    Allergies  Allergen Reactions  . Metoprolol Anaphylaxis    Pt states cardiac arrest occurred when given. Dr Eliane Decree as the EDP.  . Toprol Xl [Metoprolol Succinate] Anaphylaxis    Pt states cardiac arrest occurred when given. Dr Eliane Decree as the EDP.  . Other     Pt states he does not do well with myocins  . Sulfamethoxazole-Trimethoprim   . Wheat Other (See Comments)    Was told according to a skin test   . Azithromycin Anxiety    Allergy if on long periods at a time  . Ciprofloxacin Anxiety    nervous  . Wheat Bran Rash    Was told according to a skin test   . Zithromax [Azithromycin Dihydrate] Anxiety    Allergy if on long periods at a time    Diagnoses:    ICD-10-CM   1. Major depressive disorder, recurrent episode, moderate ([place])  F33.1      SUBJECTIVE: (Patient  goes by "[name]") Patient in today for initial evaluation.  He is a 64 yr old gentleman, married for 45 yrs and has 1 adult daughter who is 41 yrs old and lives in [place].  Patient has concerns about her, as she is in debt and husband "doesn't hold a job" and there's marital issues, "and I'm afraid her mental health is not good, and also his wife who is diagnosed with uterine cancer, "was stage 2" and had surgery and got good report and doesn't have to have chemo nor radiation, so that is encouraging.  Patient still very guarded. Parents died in house fire in [place] in 2010 with unanswered questions about the fire.  This event has been very difficult for patient to  work through and move forward and he added that "we will need to talk about this also."  Plan of Care:  Patient not signing treatment plan on computer screen due to Covid.  Treatment Goals: Goals will remain on treatment plan as patient works with strategies to achieve his goals.  Progress will be noted each visit and documented in the progress section of treatment plan.  Long term goal: Patient will develop the ability to recognize, accept, and cope with feelings of depression.  Short term goal: Identify and replace depressive thinking that leads to depressive feelings and actions  Strategy: Replace negative self-defeating self talk with verbalization of realistic and positive cognitive messages.  Make positive statements regarding self and ability to cope with stresses of life   Progress: (Goes by [name]) We completed patient's initial evaluation and then completed his treatment goal plan as noted above.  He is "cautiously" motivated.  Plan to see him again within 2 to 3 weeks.   Goal review with patient.  Next appt within 2-3 weeks.   Shanon Ace, LCSW

## 2020-05-09 ENCOUNTER — Ambulatory Visit: Payer: PPO | Admitting: Psychiatry

## 2020-06-06 DIAGNOSIS — M25521 Pain in right elbow: Secondary | ICD-10-CM | POA: Diagnosis not present

## 2020-06-22 ENCOUNTER — Other Ambulatory Visit: Payer: Self-pay

## 2020-06-22 ENCOUNTER — Ambulatory Visit (INDEPENDENT_AMBULATORY_CARE_PROVIDER_SITE_OTHER): Payer: PPO | Admitting: Psychiatry

## 2020-06-22 DIAGNOSIS — H02403 Unspecified ptosis of bilateral eyelids: Secondary | ICD-10-CM | POA: Diagnosis not present

## 2020-06-22 DIAGNOSIS — F331 Major depressive disorder, recurrent, moderate: Secondary | ICD-10-CM

## 2020-06-22 DIAGNOSIS — H02834 Dermatochalasis of left upper eyelid: Secondary | ICD-10-CM | POA: Diagnosis not present

## 2020-06-22 DIAGNOSIS — H43813 Vitreous degeneration, bilateral: Secondary | ICD-10-CM | POA: Diagnosis not present

## 2020-06-22 DIAGNOSIS — H02831 Dermatochalasis of right upper eyelid: Secondary | ICD-10-CM | POA: Diagnosis not present

## 2020-06-22 NOTE — Progress Notes (Signed)
      [facility] Counselor/Therapist Progress Note  Patient ID: KEILEN KAHL, MRN: [id number],    Date: 06/22/2020  Time Spent: 60 minutes   5:00pm to 6:00pm  Treatment Type: Individual Therapy  Reported Symptoms: depression, anxiety  Mental Status Exam:  Appearance:   Casual     Behavior:  Appropriate, Sharing and Motivated  Motor:  Normal  Speech/Language:   Clear and Coherent  Affect:  anxious, depressed  Mood:  anxious and depressed  Thought process:  normal  Thought content:    Some rumination  Sensory/Perceptual disturbances:    WNL  Orientation:  oriented to person, place, time/date, situation, day of week, month of year and year  Attention:  Fair  Concentration:  Fair  Memory:  some forgetfulness and worse under stress  Fund of knowledge:   Good  Insight:    Good and Fair  Judgment:   Good  Impulse Control:  Good   Risk Assessment: Danger to Self:  No Self-injurious Behavior: No Danger to Others: No Duty to Warn:no Physical Aggression / Violence:No  Access to Firearms a concern: No  Gang Involvement:No   Subjective: Patient today reports anxiety and depression. Some relief that wife's cancer surgery is over and wife is doing better.  Interventions: Solution-Oriented/Positive Psychology and Ego-Supportive  Diagnosis:   ICD-10-CM   1. Major depressive disorder, recurrent episode, moderate ([place])  F33.1      Plan of Care:  Patient not signing treatment plan on computer screen due to Covid.  Treatment Goals: Goals will remain on treatment plan as patient works with strategies to achieve his goals.  Progress will be noted each visit and documented in the progress section of treatment plan.  Long term goal: Patient will develop the ability to recognize, accept, and cope with feelings of depression.  Short term goal: Identify and replace depressive thinking that leads to depressive feelings and actions  Strategy: Replace negative self-defeating  self talk with verbalization of realistic and positive cognitive messages.  Make positive statements regarding self and ability to cope with stresses of life  Progress:  Patient in today reporting anxiety and depression. Updated report on wife's health situation is positive so he is more relieved on that.  Concerned about his 62 yr old daughter and the marital situation she is in.  "I worry about her a lot" and vented his concerns.  Lots of depressive/anxious thoughts and hard for him to challenge them. Has offered for daughter to live with him and his wife, but daughter wants to continue living in her apartment. There is no known abusive behavior involved with daughter and her husband.  (Not all details provided in this note.)  Patient also shared his concerns about his right eye, but has reportedly gotten a very positive report on it this morning. Some rumination about multiple extended family members and health issues. Did later re-direct to himself and how" he has has a better day today". Encouraged good self-care for patient, including looking for more positives than negatives, try to stay more in the present than worrying about the future, celebrate the positives, healthy nutrition, physical movement as he is able, exercise good boundaries with other people, and focusing on what he can control versus cannot control.  Goal review and progress/challenges noted with patient.  Next appointment within 3 to 4 weeks.   Shanon Ace, LCSW

## 2020-06-28 DIAGNOSIS — M25521 Pain in right elbow: Secondary | ICD-10-CM | POA: Diagnosis not present

## 2020-07-05 DIAGNOSIS — Z6838 Body mass index (BMI) 38.0-38.9, adult: Secondary | ICD-10-CM | POA: Diagnosis not present

## 2020-07-05 DIAGNOSIS — K6289 Other specified diseases of anus and rectum: Secondary | ICD-10-CM | POA: Diagnosis not present

## 2020-07-05 DIAGNOSIS — M7031 Other bursitis of elbow, right elbow: Secondary | ICD-10-CM | POA: Diagnosis not present

## 2020-07-05 DIAGNOSIS — L304 Erythema intertrigo: Secondary | ICD-10-CM | POA: Diagnosis not present

## 2020-07-05 DIAGNOSIS — Z7689 Persons encountering health services in other specified circumstances: Secondary | ICD-10-CM | POA: Diagnosis not present

## 2020-07-05 DIAGNOSIS — F39 Unspecified mood [affective] disorder: Secondary | ICD-10-CM | POA: Diagnosis not present

## 2020-07-05 DIAGNOSIS — Z6837 Body mass index (BMI) 37.0-37.9, adult: Secondary | ICD-10-CM | POA: Diagnosis not present

## 2020-07-05 DIAGNOSIS — R1030 Lower abdominal pain, unspecified: Secondary | ICD-10-CM | POA: Diagnosis not present

## 2020-07-05 DIAGNOSIS — L309 Dermatitis, unspecified: Secondary | ICD-10-CM | POA: Diagnosis not present

## 2020-07-05 DIAGNOSIS — M109 Gout, unspecified: Secondary | ICD-10-CM | POA: Diagnosis not present

## 2020-07-05 DIAGNOSIS — R350 Frequency of micturition: Secondary | ICD-10-CM | POA: Diagnosis not present

## 2020-07-05 DIAGNOSIS — E782 Mixed hyperlipidemia: Secondary | ICD-10-CM | POA: Diagnosis not present

## 2020-07-05 DIAGNOSIS — R339 Retention of urine, unspecified: Secondary | ICD-10-CM | POA: Diagnosis not present

## 2020-07-05 DIAGNOSIS — F333 Major depressive disorder, recurrent, severe with psychotic symptoms: Secondary | ICD-10-CM | POA: Diagnosis not present

## 2020-07-19 DIAGNOSIS — S61243A Puncture wound with foreign body of left middle finger without damage to nail, initial encounter: Secondary | ICD-10-CM | POA: Diagnosis not present

## 2020-07-19 DIAGNOSIS — Z23 Encounter for immunization: Secondary | ICD-10-CM | POA: Diagnosis not present

## 2020-07-31 ENCOUNTER — Telehealth: Payer: Self-pay | Admitting: Cardiovascular Disease

## 2020-07-31 DIAGNOSIS — E790 Hyperuricemia without signs of inflammatory arthritis and tophaceous disease: Secondary | ICD-10-CM | POA: Diagnosis not present

## 2020-07-31 DIAGNOSIS — I723 Aneurysm of iliac artery: Secondary | ICD-10-CM | POA: Diagnosis not present

## 2020-07-31 DIAGNOSIS — M7031 Other bursitis of elbow, right elbow: Secondary | ICD-10-CM | POA: Diagnosis not present

## 2020-07-31 NOTE — Telephone Encounter (Signed)
Left message to call back  

## 2020-07-31 NOTE — Telephone Encounter (Signed)
Patient returning phone call 

## 2020-07-31 NOTE — Telephone Encounter (Signed)
New Message:     Pt said he saw his primary doctor this morning and he wanted him to follow up with Dr Burt Knack. He was concerned about pt's Aneurysm. I scheduled a Virtual Visit with Richardson Dopp for tomorrow(08-01-20).

## 2020-07-31 NOTE — Progress Notes (Signed)
Virtual Visit via Telephone Note   This visit type was conducted due to national recommendations for restrictions regarding the COVID-19 Pandemic (e.g. social distancing) in an effort to limit this patient's exposure and mitigate transmission in our community.  Due to his co-morbid illnesses, this patient is at least at moderate risk for complications without adequate follow up.  This format is felt to be most appropriate for this patient at this time.  The patient did not have access to video technology/had technical difficulties with video requiring transitioning to audio format only (telephone).  All issues noted in this document were discussed and addressed.  No physical exam could be performed with this format.  Please refer to the patient's chart for his  consent to telehealth for [facility].    Date:  08/01/2020   ID:  Craig [name], DOB 01/01/1956, MRN [id number] The patient was identified using 2 identifiers.  Patient Location: Home Provider Location: Home Office  PCP:  Celene Squibb, MD  Cardiologist:  Sherren Mocha, MD  Electrophysiologist:  None   Evaluation Performed:  Follow-Up Visit  Chief Complaint:  Follow-up (Review CT scan report from 2019)    Patient Profile: Craig [name] is a 64 y.o. male with:  Palpitations ? Symptomatic PVCs ? Hx of syncope (2008) ? Hx of ventricular bigeminy  ? Intol of bisoprolol due to bradycardia   Hypertension   Hyperlipidemia   Sleep apnea   Left common iliac artery aneurysm  CT 12/28/2017: Distal left CIA 3 cm, proximal left CIA 2.3 cm, normal aorta   Prior CV Studies: Holter 09/16/17 Sinus rhythm with periods of sinus bradycardia lowest HR 51 bpm Few PVC's (<1%), PAC's, and short atrial runs No significant bradycardic events, pathologic pauses > 3 seconds, or sustained arrhythmias Benign Holter  Echo 6/12 EF 60%, mild LVH, normal diastolic function  ETT-Echo 5/12 Normal  History of Present Illness:     Mr. Smithey was last seen via telemedicine in May 2021.  He is seen for follow-up.  We received a copy of his CT scan obtained at [facility] and 2019.  This demonstrated a distal left common iliac artery aneurysm measuring 3 cm and proximal left common iliac artery aneurysm measuring 2.3 cm.  He is seen for follow-up.  He does not have any groin pain.  He has not had chest pain, significant shortness of breath, syncope or leg swelling.  He notes concerns that his PCP noted that there was some issues on the right side.  I reviewed the CT scan report that was sent to me.  I do not see any mention of right iliac artery aneurysms.  Past Medical History:  Diagnosis Date  . Anxiety   . Asthma   . Chest pain    Nuclear, 2006, normal, / stress echo, June, 2012, normal, EF 60%  . Diabetes mellitus    ?Pt notes he does not have; not clear how this ended up in chart  . Diverticular disease   . Ejection fraction    EF 60%, echo, June, 2012  . Fatigue    Chronic  . Gout   . Hyperlipemia   . Hypertension   . Overweight(278.02)   . Palpitations    30 day event recorder, 2006, negative  . Sleep apnea    Dr. Halford Chessman  . Tachycardia    January, 2013  . Tobacco abuse    former  . Urinary retention   . Ventricular bigeminy  Past Surgical History:  Procedure Laterality Date  . SHOULDER SURGERY     high school     Current Meds  Medication Sig  . allopurinol (ZYLOPRIM) 100 MG tablet Take 200 mg by mouth daily.  Marland Kitchen ALPRAZolam (XANAX) 0.5 MG tablet Take 0.5 mg by mouth 3 (three) times daily.   Marland Kitchen amitriptyline (ELAVIL) 10 MG tablet Take 10 mg by mouth at bedtime.  Marland Kitchen atenolol (TENORMIN) 25 MG tablet TAKE ONE TABLET BY MOUTH ONCE DAILY.  Marland Kitchen colchicine 0.6 MG tablet Take 0.6 mg by mouth daily. As needed for gout flare.  Mariane Baumgarten Calcium (STOOL SOFTENER PO) Take 1-2 tablets by mouth daily as needed (constipation).   . indomethacin (INDOCIN) 25 MG capsule Take 25 mg by mouth daily  as needed for mild pain. If needed for gout  . RAPAFLO 8 MG CAPS capsule Take 8 mg by mouth daily.     Allergies:   Metoprolol, Toprol xl [metoprolol succinate], Other, Sulfamethoxazole-trimethoprim, Wheat, Azithromycin, Ciprofloxacin, Wheat bran, and Zithromax [azithromycin dihydrate]   Social History   Tobacco Use  . Smoking status: Former Research scientist (life sciences)  . Smokeless tobacco: Never Used  . Tobacco comment: Only smoked x 1 year in high school.  Substance Use Topics  . Alcohol use: No    Alcohol/week: 0.0 standard drinks  . Drug use: No     Family Hx: The patient's family history includes Diverticulosis in his maternal grandfather and sister; Hypertension in his mother.  ROS:   Please see the history of present illness.      Labs/Other Tests and Data Reviewed:    EKG:  No ECG reviewed.  Recent Labs: No results found for requested labs within last 8760 hours.   Recent Lipid Panel No results found for: CHOL, TRIG, HDL, CHOLHDL, LDLCALC, LDLDIRECT  Wt Readings from Last 3 Encounters:  08/01/20 255 lb (115.7 kg)  02/02/20 255 lb (115.7 kg)  10/13/17 267 lb 7.2 oz (121.3 kg)    Abdominal pelvic CT scan obtained from primary care personally reviewed and interpreted (performed at [facility]) 12/28/2017: Distal left common iliac artery aneurysm measuring 3 cm; proximal left common iliac artery aneurysm measuring 2.3 cm; normal aorta  Labs from primary care personally reviewed and interpreted 07/05/2020: Hemoglobin 16.1, total cholesterol 177, triglycerides 111, HDL 41, LDL 116  Risk Assessment/Calculations:      Objective:    Vital Signs:  Ht 5\' 9"  (1.753 m)   Wt 255 lb (115.7 kg)   BMI 37.66 kg/m    VITAL SIGNS:  reviewed GEN:  no acute distress PSYCH:  normal affect  ASSESSMENT & PLAN:    1. Aneurysm of left common iliac artery (HCC) The CT scan report from 2019 was reviewed.  This is a noncontrast CT scan of the abdomen pelvis.  We discussed  the potential limitations of a CT scan without contrast measuring an accurate size of the iliac artery aneurysm.  I have recommended that we repeat an abdominal and pelvic CT angiogram with contrast to better size his left common iliac artery aneurysm as well as to screen for any abnormal findings on the right.  If his aneurysm still measures ? 3 cm, I will refer him to vascular surgery to further evaluate.  2. Palpitations His palpitations are largely controlled on his current dose of atenolol.  Continue atenolol 25 mg daily.     Time:   Today, I have spent 24 minutes with the patient with telehealth technology discussing the  above problems.     Medication Adjustments/Labs and Tests Ordered: Current medicines are reviewed at length with the patient today.  Concerns regarding medicines are outlined above.   Tests Ordered: Orders Placed This Encounter  Procedures  . CT Angio Abd/Pel w/ and/or w/o    Medication Changes: No orders of the defined types were placed in this encounter.   Follow Up:  In Person May 2022  Signed, Richardson Dopp, Hershal Coria  08/01/2020 4:48 PM    [facility]

## 2020-07-31 NOTE — Telephone Encounter (Signed)
Spoke with Aldona Bar at Dr. Juel Burrow office. She states only a Rheumatology referral was placed at the visit but Dr. Nevada Crane also mentioned follow-up on 2019 CT is needed.  Requested she fax both the note and CT results from 2019 to [phone] for Scott to view for tomorrow's visit. She was agreeable.

## 2020-07-31 NOTE — Telephone Encounter (Signed)
Records received and given to Medical Records to upload. Confirmed records will be viewable in Epic tomorrow.

## 2020-08-01 ENCOUNTER — Encounter: Payer: Self-pay | Admitting: Physician Assistant

## 2020-08-01 ENCOUNTER — Other Ambulatory Visit: Payer: Self-pay

## 2020-08-01 ENCOUNTER — Telehealth (INDEPENDENT_AMBULATORY_CARE_PROVIDER_SITE_OTHER): Payer: PPO | Admitting: Physician Assistant

## 2020-08-01 VITALS — Ht 69.0 in | Wt 255.0 lb

## 2020-08-01 DIAGNOSIS — I723 Aneurysm of iliac artery: Secondary | ICD-10-CM

## 2020-08-01 DIAGNOSIS — R002 Palpitations: Secondary | ICD-10-CM

## 2020-08-01 DIAGNOSIS — I10 Essential (primary) hypertension: Secondary | ICD-10-CM

## 2020-08-01 DIAGNOSIS — E119 Type 2 diabetes mellitus without complications: Secondary | ICD-10-CM

## 2020-08-01 NOTE — Patient Instructions (Signed)
Medication Instructions:  Your physician recommends that you continue on your current medications as directed. Please refer to the Current Medication list given to you today.  *If you need a refill on your cardiac medications before your next appointment, please call your pharmacy*  Lab Work: None ordered today  Testing/Procedures: Your physician has ordered at CT scan of the abdomen  Follow-Up: At [facility], you and your health needs are our priority.  As part of our continuing mission to provide you with exceptional heart care, we have created designated Provider Care Teams.  These Care Teams include your primary Cardiologist (physician) and Advanced Practice Providers (APPs -  Physician Assistants and Nurse Practitioners) who all work together to provide you with the care you need, when you need it.  Your next appointment:   6 month(s)  The format for your next appointment:   In Person  Provider:   You may see Sherren Mocha, MD or Richardson Dopp, PA-C

## 2020-08-21 DIAGNOSIS — R300 Dysuria: Secondary | ICD-10-CM | POA: Diagnosis not present

## 2020-08-21 DIAGNOSIS — F411 Generalized anxiety disorder: Secondary | ICD-10-CM | POA: Diagnosis not present

## 2020-08-21 DIAGNOSIS — N410 Acute prostatitis: Secondary | ICD-10-CM | POA: Diagnosis not present

## 2020-08-23 NOTE — Addendum Note (Signed)
Addended by: Harland German A on: 08/23/2020 10:44 AM   Modules accepted: Orders

## 2020-09-06 ENCOUNTER — Telehealth: Payer: Self-pay | Admitting: Cardiovascular Disease

## 2020-09-06 NOTE — Telephone Encounter (Signed)
Patient was schedule for test on 09-12-20.  Called on 08-28-20 and cancel the test,  don't  want to have the ct right now due to covid case growing. He will call back and resch. He understands the risk of not having it right now.  Order will be closed.

## 2020-09-06 NOTE — Telephone Encounter (Signed)
Reschedule for March 2022 Craig [name], [place]    09/06/2020 12:48 PM

## 2020-09-06 NOTE — Telephone Encounter (Signed)
CT ordered by Tereso Newcomer, PA-C.  Will route to him and his covering CMA to let them know pt has cancelled test.

## 2020-09-07 ENCOUNTER — Other Ambulatory Visit: Payer: PPO

## 2020-09-12 ENCOUNTER — Other Ambulatory Visit: Payer: PPO

## 2020-09-14 DIAGNOSIS — J029 Acute pharyngitis, unspecified: Secondary | ICD-10-CM | POA: Diagnosis not present

## 2020-09-14 DIAGNOSIS — Z1152 Encounter for screening for COVID-19: Secondary | ICD-10-CM | POA: Diagnosis not present

## 2020-09-14 DIAGNOSIS — J0100 Acute maxillary sinusitis, unspecified: Secondary | ICD-10-CM | POA: Diagnosis not present

## 2020-09-15 DIAGNOSIS — M7031 Other bursitis of elbow, right elbow: Secondary | ICD-10-CM | POA: Diagnosis not present

## 2020-09-15 DIAGNOSIS — Z712 Person consulting for explanation of examination or test findings: Secondary | ICD-10-CM | POA: Diagnosis not present

## 2020-09-15 DIAGNOSIS — J029 Acute pharyngitis, unspecified: Secondary | ICD-10-CM | POA: Diagnosis not present

## 2020-09-15 DIAGNOSIS — R339 Retention of urine, unspecified: Secondary | ICD-10-CM | POA: Diagnosis not present

## 2020-09-15 DIAGNOSIS — R1030 Lower abdominal pain, unspecified: Secondary | ICD-10-CM | POA: Diagnosis not present

## 2020-09-15 DIAGNOSIS — Z6837 Body mass index (BMI) 37.0-37.9, adult: Secondary | ICD-10-CM | POA: Diagnosis not present

## 2020-09-15 DIAGNOSIS — Z1152 Encounter for screening for COVID-19: Secondary | ICD-10-CM | POA: Diagnosis not present

## 2020-09-15 DIAGNOSIS — L304 Erythema intertrigo: Secondary | ICD-10-CM | POA: Diagnosis not present

## 2020-09-15 DIAGNOSIS — M109 Gout, unspecified: Secondary | ICD-10-CM | POA: Diagnosis not present

## 2020-09-15 DIAGNOSIS — Z6838 Body mass index (BMI) 38.0-38.9, adult: Secondary | ICD-10-CM | POA: Diagnosis not present

## 2020-09-15 DIAGNOSIS — F333 Major depressive disorder, recurrent, severe with psychotic symptoms: Secondary | ICD-10-CM | POA: Diagnosis not present

## 2020-09-15 DIAGNOSIS — J0100 Acute maxillary sinusitis, unspecified: Secondary | ICD-10-CM | POA: Diagnosis not present

## 2020-09-20 ENCOUNTER — Telehealth: Payer: Self-pay | Admitting: Cardiovascular Disease

## 2020-09-20 NOTE — Telephone Encounter (Signed)
New message:    Patient calling concering his covid test back and states his pcp and he thinks they closed the office this week. Patient also states. Patient also been having PCV. Please call patient.

## 2020-09-20 NOTE — Telephone Encounter (Signed)
Spoke with the patient at length who is frustrated because he was COVID tested on Friday of last week by his PCP. His PCP is now closed for the entire week due to staffing issues. Patient is concerned because he can not get his test results or get in touch with anyone from his PCP office.   Advised patient to get retested and provided [place] Covid testing information phone number. Patient denies any cardiac symptoms at this time. He will call back with any new concerns.

## 2020-10-11 DIAGNOSIS — M5459 Other low back pain: Secondary | ICD-10-CM | POA: Diagnosis not present

## 2020-10-20 DIAGNOSIS — R0781 Pleurodynia: Secondary | ICD-10-CM | POA: Diagnosis not present

## 2020-10-20 DIAGNOSIS — M5136 Other intervertebral disc degeneration, lumbar region: Secondary | ICD-10-CM | POA: Diagnosis not present

## 2020-11-14 DIAGNOSIS — R0781 Pleurodynia: Secondary | ICD-10-CM | POA: Diagnosis not present

## 2020-11-14 DIAGNOSIS — M5136 Other intervertebral disc degeneration, lumbar region: Secondary | ICD-10-CM | POA: Diagnosis not present

## 2020-11-14 DIAGNOSIS — M546 Pain in thoracic spine: Secondary | ICD-10-CM | POA: Diagnosis not present

## 2020-11-15 ENCOUNTER — Other Ambulatory Visit: Payer: Self-pay | Admitting: Specialist

## 2020-11-15 DIAGNOSIS — R0781 Pleurodynia: Secondary | ICD-10-CM

## 2020-11-18 ENCOUNTER — Other Ambulatory Visit: Payer: PPO

## 2020-11-20 DIAGNOSIS — F419 Anxiety disorder, unspecified: Secondary | ICD-10-CM | POA: Diagnosis not present

## 2020-11-20 DIAGNOSIS — M545 Low back pain, unspecified: Secondary | ICD-10-CM | POA: Diagnosis not present

## 2020-11-28 DIAGNOSIS — H6121 Impacted cerumen, right ear: Secondary | ICD-10-CM | POA: Diagnosis not present

## 2020-11-28 DIAGNOSIS — F411 Generalized anxiety disorder: Secondary | ICD-10-CM | POA: Diagnosis not present

## 2020-12-25 DIAGNOSIS — J029 Acute pharyngitis, unspecified: Secondary | ICD-10-CM | POA: Diagnosis not present

## 2020-12-25 DIAGNOSIS — F333 Major depressive disorder, recurrent, severe with psychotic symptoms: Secondary | ICD-10-CM | POA: Diagnosis not present

## 2020-12-25 DIAGNOSIS — Z6838 Body mass index (BMI) 38.0-38.9, adult: Secondary | ICD-10-CM | POA: Diagnosis not present

## 2020-12-25 DIAGNOSIS — Z6837 Body mass index (BMI) 37.0-37.9, adult: Secondary | ICD-10-CM | POA: Diagnosis not present

## 2020-12-25 DIAGNOSIS — M7031 Other bursitis of elbow, right elbow: Secondary | ICD-10-CM | POA: Diagnosis not present

## 2020-12-25 DIAGNOSIS — Z1152 Encounter for screening for COVID-19: Secondary | ICD-10-CM | POA: Diagnosis not present

## 2020-12-25 DIAGNOSIS — J0100 Acute maxillary sinusitis, unspecified: Secondary | ICD-10-CM | POA: Diagnosis not present

## 2020-12-25 DIAGNOSIS — Z712 Person consulting for explanation of examination or test findings: Secondary | ICD-10-CM | POA: Diagnosis not present

## 2020-12-25 DIAGNOSIS — M109 Gout, unspecified: Secondary | ICD-10-CM | POA: Diagnosis not present

## 2020-12-25 DIAGNOSIS — R1030 Lower abdominal pain, unspecified: Secondary | ICD-10-CM | POA: Diagnosis not present

## 2020-12-25 DIAGNOSIS — R339 Retention of urine, unspecified: Secondary | ICD-10-CM | POA: Diagnosis not present

## 2020-12-25 DIAGNOSIS — L304 Erythema intertrigo: Secondary | ICD-10-CM | POA: Diagnosis not present

## 2020-12-28 DIAGNOSIS — F419 Anxiety disorder, unspecified: Secondary | ICD-10-CM | POA: Diagnosis not present

## 2020-12-28 DIAGNOSIS — I499 Cardiac arrhythmia, unspecified: Secondary | ICD-10-CM | POA: Diagnosis not present

## 2020-12-28 DIAGNOSIS — E790 Hyperuricemia without signs of inflammatory arthritis and tophaceous disease: Secondary | ICD-10-CM | POA: Diagnosis not present

## 2020-12-28 DIAGNOSIS — R339 Retention of urine, unspecified: Secondary | ICD-10-CM | POA: Diagnosis not present

## 2020-12-28 DIAGNOSIS — E782 Mixed hyperlipidemia: Secondary | ICD-10-CM | POA: Diagnosis not present

## 2020-12-28 DIAGNOSIS — R5382 Chronic fatigue, unspecified: Secondary | ICD-10-CM | POA: Diagnosis not present

## 2020-12-28 DIAGNOSIS — Z6836 Body mass index (BMI) 36.0-36.9, adult: Secondary | ICD-10-CM | POA: Diagnosis not present

## 2020-12-28 DIAGNOSIS — R972 Elevated prostate specific antigen [PSA]: Secondary | ICD-10-CM | POA: Diagnosis not present

## 2020-12-28 DIAGNOSIS — R7301 Impaired fasting glucose: Secondary | ICD-10-CM | POA: Diagnosis not present

## 2020-12-28 DIAGNOSIS — R945 Abnormal results of liver function studies: Secondary | ICD-10-CM | POA: Diagnosis not present

## 2020-12-28 DIAGNOSIS — D649 Anemia, unspecified: Secondary | ICD-10-CM | POA: Diagnosis not present

## 2020-12-28 DIAGNOSIS — I714 Abdominal aortic aneurysm, without rupture: Secondary | ICD-10-CM | POA: Diagnosis not present

## 2021-02-14 DIAGNOSIS — R339 Retention of urine, unspecified: Secondary | ICD-10-CM | POA: Diagnosis not present

## 2021-02-14 DIAGNOSIS — B356 Tinea cruris: Secondary | ICD-10-CM | POA: Diagnosis not present

## 2021-03-01 DIAGNOSIS — B356 Tinea cruris: Secondary | ICD-10-CM | POA: Diagnosis not present

## 2021-03-06 DIAGNOSIS — L304 Erythema intertrigo: Secondary | ICD-10-CM | POA: Diagnosis not present

## 2021-03-08 DIAGNOSIS — N401 Enlarged prostate with lower urinary tract symptoms: Secondary | ICD-10-CM | POA: Diagnosis not present

## 2021-03-08 DIAGNOSIS — N138 Other obstructive and reflux uropathy: Secondary | ICD-10-CM | POA: Diagnosis not present

## 2021-03-09 ENCOUNTER — Ambulatory Visit: Payer: Self-pay

## 2021-03-09 ENCOUNTER — Emergency Department
Admission: EM | Admit: 2021-03-09 | Discharge: 2021-03-09 | Disposition: A | Discharge status: Home / Self Care | Payer: PPO | Source: Home / Self Care | Attending: Family Medicine | Admitting: Family Medicine

## 2021-03-09 ENCOUNTER — Other Ambulatory Visit: Payer: Self-pay

## 2021-03-09 ENCOUNTER — Encounter: Payer: Self-pay | Admitting: Emergency Medicine

## 2021-03-09 DIAGNOSIS — R300 Dysuria: Secondary | ICD-10-CM | POA: Diagnosis not present

## 2021-03-09 LAB — POCT URINALYSIS DIP (MANUAL ENTRY)
Bilirubin, UA: NEGATIVE
Glucose, UA: NEGATIVE mg/dL
Ketones, POC UA: NEGATIVE mg/dL
Nitrite, UA: NEGATIVE
Protein Ur, POC: NEGATIVE mg/dL
Spec Grav, UA: 1.01 (ref 1.010–1.025)
Urobilinogen, UA: 0.2 E.U./dL
pH, UA: 6.5 (ref 5.0–8.0)

## 2021-03-09 MED ORDER — CEPHALEXIN 500 MG PO CAPS: 500.0000 mg | ORAL_CAPSULE | Freq: Two times a day (BID) | ORAL | 0 refills | Status: DC

## 2021-03-09 NOTE — Discharge Instructions (Addendum)
Increase fluid intake. May use non-prescription AZO for about two days, if desired, to decrease urinary discomfort.  If symptoms become significantly worse during the night or over the weekend, proceed to the local emergency room.  

## 2021-03-09 NOTE — ED Triage Notes (Signed)
Patient states that he went into urinary retention last week.  Patient was able to pass an in and out catheter.  Patient seen a PA (urology) yesterday, urine specimen was completely clear.  Patient was not started on any antibiotics yesterday.  This morning patient began having cloudy urine, no hematuria, dysuria, bladder spasms, urgency and frequency.  Taking Rapaflo to help with urination.

## 2021-03-09 NOTE — ED Notes (Signed)
Clarification from pt - U/A collected was a clean catch

## 2021-03-09 NOTE — ED Provider Notes (Signed)
[facility]    CSN: [id number] Arrival date & time: 03/09/21  1249      History   Chief Complaint Chief Complaint  Patient presents with   Dysuria    HPI Craig [name] is a 65 y.o. male.   Patient states that he has a history of intermittent urinary retention which recurred again last week.  He was able to pass an in/out catheter.  He visited a urology provider yesterday where his urinalysis was normal (no culture taken, and not started on antibiotic).  This morning he began having cloudy urine and mild dysuria without hematuria, abdominal pain, urgency or frequency.  He denies nausea/vomiting, flank pain, and fevers, chills, and sweats.  The history is provided by the patient.  Dysuria Presenting symptoms: dysuria   Presenting symptoms: no penile discharge   Context: during urination   Relieved by:  None tried Worsened by:  Urination Ineffective treatments:  None tried Associated symptoms: urinary retention   Associated symptoms: no abdominal pain, no fever, no flank pain, no groin pain, no hematuria, no nausea, no penile redness, no urinary frequency, no urinary hesitation, no urinary incontinence and no vomiting   Risk factors: urinary catheter    Past Medical History:  Diagnosis Date   Anxiety    Asthma    Chest pain    Nuclear, 2006, normal, / stress echo, June, 2012, normal, EF 60%   Diabetes mellitus    ?Pt notes he does not have; not clear how this ended up in chart   Diverticular disease    Ejection fraction    EF 60%, echo, June, 2012   Fatigue    Chronic   Gout    Hyperlipemia    Hypertension    Overweight(278.02)    Palpitations    30 day event recorder, 2006, negative   Sleep apnea    Dr. Halford Chessman   Tachycardia    January, 2013   Tobacco abuse    former   Urinary retention    Ventricular bigeminy     Patient Active Problem List   Diagnosis Date Noted   Tachycardia    Ejection fraction    Palpitations    Chest pain    Sleep apnea     Gout    Anxiety    Tobacco abuse    Diabetes mellitus ([place])    Asthma    Diverticular disease    Overweight(278.02)    Fatigue     Past Surgical History:  Procedure Laterality Date   SHOULDER SURGERY     high school       Home Medications    Prior to Admission medications   Medication Sig Start Date End Date Taking? Authorizing Provider  allopurinol (ZYLOPRIM) 100 MG tablet Take 200 mg by mouth daily.   Yes [provider]  ALPRAZolam Duanne Moron) 0.5 MG tablet Take 0.5 mg by mouth 3 (three) times daily.   Yes [provider]  amitriptyline (ELAVIL) 10 MG tablet Take 10 mg by mouth at bedtime.   Yes [provider]  atenolol (TENORMIN) 25 MG tablet TAKE ONE TABLET BY MOUTH ONCE DAILY. 08/21/18  Yes Sherren Mocha, MD  cephALEXin (KEFLEX) 500 MG capsule Take 1 capsule (500 mg total) by mouth 2 (two) times daily. 03/09/21  Yes Kandra Nicolas, MD  colchicine 0.6 MG tablet Take 0.6 mg by mouth daily. As needed for gout flare.   Yes [provider]  Docusate Calcium (STOOL SOFTENER PO) Take 1-2  tablets by mouth daily as needed (constipation).    Yes [provider]  indomethacin (INDOCIN) 25 MG capsule Take 25 mg by mouth daily as needed for mild pain. If needed for gout   Yes [provider]  RAPAFLO 8 MG CAPS capsule Take 8 mg by mouth daily. 09/19/16  Yes [provider]    Family History Family History  Problem Relation Age of Onset   Hypertension Mother    Diverticulosis Sister    Diverticulosis Maternal Grandfather     Social History Social History   Tobacco Use   Smoking status: Former    Pack years: 0.00   Smokeless tobacco: Never   Tobacco comments:    Only smoked x 1 year in high school.  Substance Use Topics   Alcohol use: No    Alcohol/week: 0.0 standard drinks   Drug use: No     Allergies   Metoprolol, Toprol xl [metoprolol succinate], Other, Sulfamethoxazole-trimethoprim, Wheat,  Azithromycin, Ciprofloxacin, Wheat bran, and Zithromax [azithromycin dihydrate]   Review of Systems Review of Systems  Constitutional:  Negative for appetite change, chills, diaphoresis, fatigue and fever.  Gastrointestinal:  Negative for abdominal pain, nausea and vomiting.  Genitourinary:  Positive for dysuria. Negative for bladder incontinence, flank pain, frequency, hematuria, hesitancy, penile discharge, testicular pain and urgency.  All other systems reviewed and are negative.   Physical Exam Triage Vital Signs ED Triage Vitals  Enc Vitals Group     BP 03/09/21 1528 102/64     Pulse Rate 03/09/21 1528 74     Resp --      Temp 03/09/21 1528 98.4 F (36.9 C)     Temp Source 03/09/21 1528 Oral     SpO2 03/09/21 1528 97 %     Weight --      Height --      Head Circumference --      Peak Flow --      Pain Score 03/09/21 1530 7     Pain Loc --      Pain Edu? --      Excl. in [place]? --    No data found.  Updated Vital Signs BP 102/64 (BP Location: Right Arm)   Pulse 74   Temp 98.4 F (36.9 C) (Oral)   SpO2 97%   Visual Acuity Right Eye Distance:   Left Eye Distance:   Bilateral Distance:    Right Eye Near:   Left Eye Near:    Bilateral Near:     Physical Exam Nursing notes and Vital Signs reviewed. Appearance:  Patient appears stated age, and in no acute distress.    Eyes:  Pupils are equal, round, and reactive to light and accomodation.  Extraocular movement is intact.  Conjunctivae are not inflamed   Pharynx:  Normal; moist mucous membranes  Neck:  Supple.  No adenopathy Lungs:  Clear to auscultation.  Breath sounds are equal.  Moving air well. Heart:  Regular rate and rhythm without murmurs, rubs, or gallops.  Abdomen:  Nontender without masses or hepatosplenomegaly.  Bowel sounds are present.  No CVA or flank tenderness.  Extremities:  No edema.  Skin:  No rash present.     UC Treatments / Results  Labs (all labs ordered are listed, but only abnormal  results are displayed) Labs Reviewed  POCT URINALYSIS DIP (MANUAL ENTRY) - Abnormal; Notable for the following components:      Result Value   Clarity, UA cloudy (*)    Blood,  UA large (*)    Leukocytes, UA Small (1+) (*)    All other components within normal limits  URINE CULTURE    EKG   Radiology No results found.  Procedures Procedures (including critical care time)  Medications Ordered in UC Medications - No data to display  Initial Impression / Assessment and Plan / UC Course  I have reviewed the triage vital signs and the nursing notes.  Pertinent labs & imaging results that were available during my care of the patient were reviewed by me and considered in my medical decision making (see chart for details).    Urine culture pending. Begin empiric Keflex. Followup with Family Doctor or urologist if not improved in one week.   Final Clinical Impressions(s) / UC Diagnoses   Final diagnoses:  Dysuria     Discharge Instructions      Increase fluid intake.  May use non-prescription AZO for about two days, if desired, to decrease urinary discomfort.   If symptoms become significantly worse during the night or over the weekend, proceed to the local emergency room.      ED Prescriptions     Medication Sig Dispense Auth. Provider   cephALEXin (KEFLEX) 500 MG capsule Take 1 capsule (500 mg total) by mouth 2 (two) times daily. 14 capsule Kandra Nicolas, MD         Kandra Nicolas, MD 03/11/21 1022

## 2021-03-11 ENCOUNTER — Telehealth: Payer: Self-pay

## 2021-03-11 ENCOUNTER — Telehealth: Payer: Self-pay | Admitting: Family Medicine

## 2021-03-11 ENCOUNTER — Telehealth: Payer: Self-pay | Admitting: Physician Assistant

## 2021-03-11 ENCOUNTER — Other Ambulatory Visit: Payer: Self-pay | Admitting: Emergency Medicine

## 2021-03-11 LAB — URINE CULTURE
MICRO NUMBER:: 12078178
SPECIMEN QUALITY:: ADEQUATE

## 2021-03-11 MED ORDER — LEVOFLOXACIN 500 MG PO TABS: ORAL_TABLET | ORAL | 0 refills | Status: DC

## 2021-03-11 NOTE — Telephone Encounter (Signed)
Patient states use [facility] in [place] for new prescription. Also states he wonders if infection might be in prostrate.

## 2021-03-11 NOTE — Telephone Encounter (Signed)
Urine culture grows enterobacter aerogene, resistant to cephalexin, sensitive to levofloxacin.  PLAN:  Switch to levofloxacin 500mg  once daily for two weeks (coverage for possible prostatitis). Stop taking amitriptyline (Elavil) while taking levofloxacin. Followup with urologist in about 10 days.

## 2021-03-11 NOTE — Telephone Encounter (Signed)
   The patient called the answering service after-hours today.  He was seen at urgent care yesterday for UTI and prescribed cephalexin. Per notes, due to culture findings, he was switched to levofloxacin. He has a history of iliac artery aneurysm by CT 2019. He was due to have this rechecked but explains he deferred due to his wife's cancer and fear of contracting Covid. He does not have a history of thoracic or aortic aneurysm by prior imaging. He is inquiring about potential alternative antibiotics given the report of risk of fluoroquinolone use in aneurysms. It was my understanding this warning primarily applies to aortic aneurysm (thoracic or abdominal) and Dr. Stanford Breed agrees. Therefore a fluoroquinolone is not unreasonable if there are no other good options to treat the infection. The patient remains reluctant to take this because of what he read online. He asked about Augmentin but c&s shows it is resistant to that. I encouraged him to reach back out to the practice that sent in the levofloxacin today to discuss potential alternatives if they deem appropriate.   The patient verbalized understanding and gratitude. He will reach back out when he is ready to proceed with follow-up.  Charlie Pitter, PA-C

## 2021-03-11 NOTE — Telephone Encounter (Signed)
TC from pt d/t concern over taking prescribed Levaquin for UTI d/t it being related to Cipro which he states "did a number on me" (listed anxiety as adverse reaction under allergies). Pt wants to know if there is perhaps a different antibiotic that he can take. Informed that Dr. Assunta Found is not working today and provider here today has already left. Reviewed organism and drug sensitivities w/ pt per his request. Pt has not begun Levaquin yet--advised he should take as directed by Dr. Assunta Found to prevent kidney infection or sepsis, and if he has an adverse reaction to seek medical attention. Also suggested that he contact his PCP or urologist to discuss alternative treatment, but for now encouraged that he begin Levaquin to prevent systemic infection. He verbalizes understanding.

## 2021-03-12 DIAGNOSIS — R339 Retention of urine, unspecified: Secondary | ICD-10-CM | POA: Diagnosis not present

## 2021-03-12 NOTE — Progress Notes (Signed)
Patient called by Dr Assunta Found, also called by PA at [facility] regarding antibiotic treatment. Pt verbalized an understanding with all three callers that he needed to start the antibiotic as directed (Levaquin) no further follow up needed at this time

## 2021-03-13 DIAGNOSIS — Z87440 Personal history of urinary (tract) infections: Secondary | ICD-10-CM | POA: Diagnosis not present

## 2021-03-13 DIAGNOSIS — R339 Retention of urine, unspecified: Secondary | ICD-10-CM | POA: Diagnosis not present

## 2021-03-13 DIAGNOSIS — R300 Dysuria: Secondary | ICD-10-CM | POA: Diagnosis not present

## 2021-03-20 ENCOUNTER — Telehealth: Payer: Self-pay | Admitting: Cardiovascular Disease

## 2021-03-20 DIAGNOSIS — Z79899 Other long term (current) drug therapy: Secondary | ICD-10-CM | POA: Diagnosis not present

## 2021-03-20 NOTE — Telephone Encounter (Signed)
Spoke with pt re choice of antibiotic and potential affects Per pt has aneurysm Records reviewed and pt has iliac aneurysm and Levaquin may bother (thoracic )abdominal aneurysm . See previous phone note Melina Copa PA . Questions were answered /cy

## 2021-03-20 NOTE — Telephone Encounter (Signed)
Pt c/o medication issue:  1. Name of Medication: levofloxacin (LEVAQUIN) 500 MG tablet  2. How are you currently taking this medication (dosage and times per day)? 1 tablet daily  3. Are you having a reaction (difficulty breathing--STAT)? no  4. What is your medication issue? Patient states he has an aneurysm in his lower left abdomen. He states the medication mentions something about aneurysms, so he would like to make sure it is okay for him to continue taking it. He states he has been on it for 9 days and is due to take his next dose at 5pm today. He states he is taking the medication for a UTI and states it is working. He also says he spoke with a PA at the office before and was told the concern was more for the aortic part of the heart. He states he does not understand this. He would like a call back to make sure he does not cause any side effects from taking the medication.

## 2021-03-23 DIAGNOSIS — N138 Other obstructive and reflux uropathy: Secondary | ICD-10-CM | POA: Diagnosis not present

## 2021-03-23 DIAGNOSIS — K5900 Constipation, unspecified: Secondary | ICD-10-CM | POA: Diagnosis not present

## 2021-03-23 DIAGNOSIS — Z87440 Personal history of urinary (tract) infections: Secondary | ICD-10-CM | POA: Diagnosis not present

## 2021-03-23 DIAGNOSIS — N401 Enlarged prostate with lower urinary tract symptoms: Secondary | ICD-10-CM | POA: Diagnosis not present

## 2021-03-29 ENCOUNTER — Telehealth: Payer: Self-pay | Admitting: Cardiovascular Disease

## 2021-03-29 NOTE — Telephone Encounter (Signed)
Pt is feeling light headed, dizzy and is also  experiencing pvcs. Plelase advise

## 2021-03-29 NOTE — Telephone Encounter (Signed)
Started last week, while patient sitting in doctors office and he felt light headed and notes that he is on medication for UTI. He is more tired than normal. Using pulse ox HR in the mid to low 50's. Feels like he is having more PVC's by the feeling of palpitations.  Patient states HR normally runs in the 60's and he was wondering if the lower heart rates was being caused by his antibiotic. He had Levaquin ordered for 14 days and only took for 10 days because of these symptoms. He met with his urologist on friday and he gave him Cefdinir 300 mg BID which his has taken before with out side effect. He is now concerned because he continues to have tiredness and lower HR in the 50's. States it has kept him on the couch all day. The patient has not missed any doses of atenolol and has stopped his allopurinol (3 weeks ago)  Current BP 121/72 HR 56.  Heart rates: 7/18 48 at lowest, avg. 50-51 7/19 low 50 7/20 50's  Gave ED precautions. Will forward to MD and PA for advisement.

## 2021-03-30 MED ORDER — ATENOLOL 25 MG PO TABS: 12.5000 mg | ORAL_TABLET | Freq: Every day | ORAL | 0 refills | Status: DC

## 2021-03-30 NOTE — Telephone Encounter (Signed)
Reviewed chart - last seen here in 2019 for palpitations. Not sure if symptoms are related to antibiotics and/or infection that is being treated. Recommend: reduce atenolol to 1/2 tab daily, push fluids, schedule next available OV with me or APP. If no improvement in symptoms with these measures, would arrange repeat monitor (3 day ZIO).  thx

## 2021-03-30 NOTE — Telephone Encounter (Signed)
Spoke to patient and gave Dr. Antionette Char recommendation. Medication reduced. Follow up appointment made. Patient to call office with continuing symptoms if needed.  Patient in agreement and verbalized understanding.

## 2021-04-03 DIAGNOSIS — L309 Dermatitis, unspecified: Secondary | ICD-10-CM | POA: Diagnosis not present

## 2021-04-03 DIAGNOSIS — L304 Erythema intertrigo: Secondary | ICD-10-CM | POA: Diagnosis not present

## 2021-04-09 DIAGNOSIS — I723 Aneurysm of iliac artery: Secondary | ICD-10-CM

## 2021-04-09 HISTORY — DX: Aneurysm of iliac artery: I72.3

## 2021-04-10 DIAGNOSIS — B356 Tinea cruris: Secondary | ICD-10-CM | POA: Diagnosis not present

## 2021-04-10 DIAGNOSIS — L304 Erythema intertrigo: Secondary | ICD-10-CM | POA: Diagnosis not present

## 2021-04-10 DIAGNOSIS — R339 Retention of urine, unspecified: Secondary | ICD-10-CM | POA: Diagnosis not present

## 2021-04-10 DIAGNOSIS — F32A Depression, unspecified: Secondary | ICD-10-CM | POA: Diagnosis not present

## 2021-04-10 DIAGNOSIS — N390 Urinary tract infection, site not specified: Secondary | ICD-10-CM | POA: Diagnosis not present

## 2021-04-10 DIAGNOSIS — F5104 Psychophysiologic insomnia: Secondary | ICD-10-CM | POA: Diagnosis not present

## 2021-04-12 DIAGNOSIS — Z87440 Personal history of urinary (tract) infections: Secondary | ICD-10-CM | POA: Diagnosis not present

## 2021-04-12 DIAGNOSIS — N138 Other obstructive and reflux uropathy: Secondary | ICD-10-CM | POA: Diagnosis not present

## 2021-04-12 DIAGNOSIS — N401 Enlarged prostate with lower urinary tract symptoms: Secondary | ICD-10-CM | POA: Diagnosis not present

## 2021-04-16 ENCOUNTER — Emergency Department
Admission: EM | Admit: 2021-04-16 | Discharge: 2021-04-16 | Disposition: A | Discharge status: Home / Self Care | Payer: PPO | Source: Home / Self Care

## 2021-04-16 ENCOUNTER — Telehealth: Payer: Self-pay

## 2021-04-16 DIAGNOSIS — R350 Frequency of micturition: Secondary | ICD-10-CM | POA: Diagnosis not present

## 2021-04-16 DIAGNOSIS — N3001 Acute cystitis with hematuria: Secondary | ICD-10-CM

## 2021-04-16 LAB — POCT URINALYSIS DIP (MANUAL ENTRY)
Bilirubin, UA: NEGATIVE
Glucose, UA: NEGATIVE mg/dL
Ketones, POC UA: NEGATIVE mg/dL
Nitrite, UA: NEGATIVE
Protein Ur, POC: NEGATIVE mg/dL
Spec Grav, UA: 1.01 (ref 1.010–1.025)
Urobilinogen, UA: 0.2 E.U./dL
pH, UA: 6.5 (ref 5.0–8.0)

## 2021-04-16 MED ORDER — NITROFURANTOIN MONOHYD MACRO 100 MG PO CAPS: 100.0000 mg | ORAL_CAPSULE | Freq: Two times a day (BID) | ORAL | 0 refills | Status: DC

## 2021-04-16 NOTE — Discharge Instructions (Addendum)
Advised patient to take medication as directed and we will follow-up with him once urine culture results return.

## 2021-04-16 NOTE — ED Provider Notes (Signed)
[name]    CSN: [id number] Arrival date & time: 04/16/21  1107      History   Chief Complaint Chief Complaint  Patient presents with   Urinary Frequency    HPI Craig [name] is a 65 y.o. male.   HPI 65 year old male presents with urinary urgency/frequency x3 days.  Patient reports that he is followed by urology on a regular basis.  Past Medical History:  Diagnosis Date   Anxiety    Asthma    Chest pain    Nuclear, 2006, normal, / stress echo, June, 2012, normal, EF 60%   Diabetes mellitus    ?Pt notes he does not have; not clear how this ended up in chart   Diverticular disease    Ejection fraction    EF 60%, echo, June, 2012   Fatigue    Chronic   Gout    Hyperlipemia    Hypertension    Overweight(278.02)    Palpitations    30 day event recorder, 2006, negative   Sleep apnea    Dr. Halford Chessman   Tachycardia    January, 2013   Tobacco abuse    former   Urinary retention    Ventricular bigeminy     Patient Active Problem List   Diagnosis Date Noted   Tachycardia    Ejection fraction    Palpitations    Chest pain    Sleep apnea    Gout    Anxiety    Tobacco abuse    Diabetes mellitus ([place])    Asthma    Diverticular disease    Overweight(278.02)    Fatigue     Past Surgical History:  Procedure Laterality Date   SHOULDER SURGERY     high school       Home Medications    Prior to Admission medications   Medication Sig Start Date End Date Taking? Authorizing Provider  nitrofurantoin, macrocrystal-monohydrate, (MACROBID) 100 MG capsule Take 1 capsule (100 mg total) by mouth 2 (two) times daily. 04/16/21  Yes Eliezer Lofts, FNP  allopurinol (ZYLOPRIM) 100 MG tablet Take 200 mg by mouth daily.    [provider]  ALPRAZolam Duanne Moron) 0.5 MG tablet Take 0.5 mg by mouth 3 (three) times daily.    [provider]  amitriptyline (ELAVIL) 10 MG tablet Take 10 mg by mouth at bedtime.    [provider]  atenolol  (TENORMIN) 25 MG tablet Take 0.5 tablets (12.5 mg total) by mouth daily. 03/30/21 06/28/21  Sherren Mocha, MD  colchicine 0.6 MG tablet Take 0.6 mg by mouth daily. As needed for gout flare.    [provider]  Docusate Calcium (STOOL SOFTENER PO) Take 1-2 tablets by mouth daily as needed (constipation).     [provider]  indomethacin (INDOCIN) 25 MG capsule Take 25 mg by mouth daily as needed for mild pain. If needed for gout    [provider]  RAPAFLO 8 MG CAPS capsule Take 8 mg by mouth daily. 09/19/16   [provider]    Family History Family History  Problem Relation Age of Onset   Hypertension Mother    Diverticulosis Sister    Diverticulosis Maternal Grandfather     Social History Social History   Tobacco Use   Smoking status: Former   Smokeless tobacco: Never   Tobacco comments:    Only smoked x 1 year in high school.  Substance Use Topics   Alcohol use: No  Alcohol/week: 0.0 standard drinks   Drug use: No     Allergies   Metoprolol, Toprol xl [metoprolol succinate], Other, Sulfamethoxazole-trimethoprim, Wheat, Azithromycin, Ciprofloxacin, Wheat bran, and Zithromax [azithromycin dihydrate]   Review of Systems Review of Systems  Genitourinary:  Positive for frequency.    Physical Exam Triage Vital Signs ED Triage Vitals  Enc Vitals Group     BP 04/16/21 1122 119/85     Pulse Rate 04/16/21 1122 71     Resp 04/16/21 1122 16     Temp 04/16/21 1122 97.6 F (36.4 C)     Temp Source 04/16/21 1122 Oral     SpO2 04/16/21 1122 96 %     Weight --      Height --      Head Circumference --      Peak Flow --      Pain Score 04/16/21 1119 0     Pain Loc --      Pain Edu? --      Excl. in [place]? --    No data found.  Updated Vital Signs BP 119/85 (BP Location: Right Arm)   Pulse 71   Temp 97.6 F (36.4 C) (Oral)   Resp 16   SpO2 96%     Physical Exam Vitals and nursing note reviewed.  Constitutional:       General: He is not in acute distress.    Appearance: Normal appearance. He is obese. He is not ill-appearing.  HENT:     Head: Normocephalic and atraumatic.     Mouth/Throat:     Mouth: Mucous membranes are moist.     Pharynx: Oropharynx is clear.  Eyes:     Extraocular Movements: Extraocular movements intact.     Conjunctiva/sclera: Conjunctivae normal.     Pupils: Pupils are equal, round, and reactive to light.  Cardiovascular:     Rate and Rhythm: Normal rate and regular rhythm.     Pulses: Normal pulses.     Heart sounds: Normal heart sounds.  Pulmonary:     Effort: Pulmonary effort is normal.     Breath sounds: Normal breath sounds. No wheezing, rhonchi or rales.  Abdominal:     Tenderness: There is no right CVA tenderness or left CVA tenderness.  Musculoskeletal:        General: Normal range of motion.     Cervical back: Normal range of motion and neck supple. No tenderness.  Lymphadenopathy:     Cervical: No cervical adenopathy.  Skin:    General: Skin is warm and dry.  Neurological:     General: No focal deficit present.     Mental Status: He is alert and oriented to person, place, and time. Mental status is at baseline.  Psychiatric:        Mood and Affect: Mood normal.        Behavior: Behavior normal.        Thought Content: Thought content normal.     UC Treatments / Results  Labs (all labs ordered are listed, but only abnormal results are displayed) Labs Reviewed  POCT URINALYSIS DIP (MANUAL ENTRY) - Abnormal; Notable for the following components:      Result Value   Clarity, UA cloudy (*)    Blood, UA trace-intact (*)    Leukocytes, UA Moderate (2+) (*)    All other components within normal limits    EKG   Radiology No results found.  Procedures Procedures (including critical care time)  Medications Ordered in  UC Medications - No data to display  Initial Impression / Assessment and Plan / UC Course  I have reviewed the triage vital signs and  the nursing notes.  Pertinent labs & imaging results that were available during my care of the patient were reviewed by me and considered in my medical decision making (see chart for details).    MDM: 1.  Acute Cystitis with hematuria-Rx'd Macrobid. Advised patient to take medication as directed and we will follow-up with him once urine culture results return.  Discharged home, hemodynamically stable. Final Clinical Impressions(s) / UC Diagnoses   Final diagnoses:  Acute cystitis with hematuria     Discharge Instructions      Advised patient to take medication as directed and we will follow-up with him once urine culture results return.     ED Prescriptions     Medication Sig Dispense Auth. Provider   nitrofurantoin, macrocrystal-monohydrate, (MACROBID) 100 MG capsule Take 1 capsule (100 mg total) by mouth 2 (two) times daily. 10 capsule Eliezer Lofts, FNP      PDMP not reviewed this encounter.   Eliezer Lofts, [place] 04/16/21 1208

## 2021-04-16 NOTE — ED Triage Notes (Addendum)
Pt having urinary urgency and cloudy urine x 3 days. Says running a fever last few nights. Was seen last month here at [facility] for urinary retention, takes rapaflo to help with urination. Sees urology regularly.

## 2021-04-16 NOTE — ED Notes (Signed)
Urine Culture ordered per Eliezer Lofts discharge papers. Put in after pt already left ofc.

## 2021-04-17 DIAGNOSIS — I700 Atherosclerosis of aorta: Secondary | ICD-10-CM | POA: Diagnosis not present

## 2021-04-17 DIAGNOSIS — N412 Abscess of prostate: Secondary | ICD-10-CM | POA: Diagnosis not present

## 2021-04-17 DIAGNOSIS — N411 Chronic prostatitis: Secondary | ICD-10-CM | POA: Diagnosis not present

## 2021-04-17 DIAGNOSIS — N400 Enlarged prostate without lower urinary tract symptoms: Secondary | ICD-10-CM | POA: Diagnosis not present

## 2021-04-17 DIAGNOSIS — K449 Diaphragmatic hernia without obstruction or gangrene: Secondary | ICD-10-CM | POA: Diagnosis not present

## 2021-04-17 DIAGNOSIS — K429 Umbilical hernia without obstruction or gangrene: Secondary | ICD-10-CM | POA: Diagnosis not present

## 2021-04-18 NOTE — Telephone Encounter (Signed)
Pt called requesting status of urine culture. Pt identifies verified. Pt notified urine culture is still in process.

## 2021-04-19 LAB — URINE CULTURE
MICRO NUMBER:: 12217336
SPECIMEN QUALITY:: ADEQUATE

## 2021-04-20 DIAGNOSIS — L245 Irritant contact dermatitis due to other chemical products: Secondary | ICD-10-CM | POA: Diagnosis not present

## 2021-04-20 DIAGNOSIS — L304 Erythema intertrigo: Secondary | ICD-10-CM | POA: Diagnosis not present

## 2021-04-20 DIAGNOSIS — D485 Neoplasm of uncertain behavior of skin: Secondary | ICD-10-CM | POA: Diagnosis not present

## 2021-04-20 DIAGNOSIS — L820 Inflamed seborrheic keratosis: Secondary | ICD-10-CM | POA: Diagnosis not present

## 2021-04-20 DIAGNOSIS — L218 Other seborrheic dermatitis: Secondary | ICD-10-CM | POA: Diagnosis not present

## 2021-05-08 DIAGNOSIS — N138 Other obstructive and reflux uropathy: Secondary | ICD-10-CM | POA: Diagnosis not present

## 2021-05-08 DIAGNOSIS — N401 Enlarged prostate with lower urinary tract symptoms: Secondary | ICD-10-CM | POA: Diagnosis not present

## 2021-05-15 DIAGNOSIS — N138 Other obstructive and reflux uropathy: Secondary | ICD-10-CM | POA: Diagnosis not present

## 2021-05-15 DIAGNOSIS — R339 Retention of urine, unspecified: Secondary | ICD-10-CM | POA: Diagnosis not present

## 2021-05-15 DIAGNOSIS — N411 Chronic prostatitis: Secondary | ICD-10-CM | POA: Diagnosis not present

## 2021-05-15 DIAGNOSIS — R338 Other retention of urine: Secondary | ICD-10-CM | POA: Diagnosis not present

## 2021-05-15 DIAGNOSIS — N401 Enlarged prostate with lower urinary tract symptoms: Secondary | ICD-10-CM | POA: Diagnosis not present

## 2021-05-21 DIAGNOSIS — L304 Erythema intertrigo: Secondary | ICD-10-CM | POA: Diagnosis not present

## 2021-05-21 DIAGNOSIS — F32A Depression, unspecified: Secondary | ICD-10-CM | POA: Diagnosis not present

## 2021-05-21 DIAGNOSIS — R339 Retention of urine, unspecified: Secondary | ICD-10-CM | POA: Diagnosis not present

## 2021-05-21 DIAGNOSIS — N390 Urinary tract infection, site not specified: Secondary | ICD-10-CM | POA: Diagnosis not present

## 2021-05-21 DIAGNOSIS — L210 Seborrhea capitis: Secondary | ICD-10-CM | POA: Diagnosis not present

## 2021-05-21 DIAGNOSIS — M6208 Separation of muscle (nontraumatic), other site: Secondary | ICD-10-CM | POA: Diagnosis not present

## 2021-05-21 DIAGNOSIS — B356 Tinea cruris: Secondary | ICD-10-CM | POA: Diagnosis not present

## 2021-05-21 DIAGNOSIS — N401 Enlarged prostate with lower urinary tract symptoms: Secondary | ICD-10-CM | POA: Diagnosis not present

## 2021-06-04 ENCOUNTER — Ambulatory Visit: Payer: PPO | Admitting: Cardiovascular Disease

## 2021-06-26 DIAGNOSIS — R339 Retention of urine, unspecified: Secondary | ICD-10-CM | POA: Diagnosis not present

## 2021-06-26 DIAGNOSIS — N401 Enlarged prostate with lower urinary tract symptoms: Secondary | ICD-10-CM | POA: Diagnosis not present

## 2021-06-26 DIAGNOSIS — N390 Urinary tract infection, site not specified: Secondary | ICD-10-CM | POA: Diagnosis not present

## 2021-07-02 ENCOUNTER — Ambulatory Visit (INDEPENDENT_AMBULATORY_CARE_PROVIDER_SITE_OTHER): Payer: PPO

## 2021-07-02 ENCOUNTER — Ambulatory Visit
Admission: EM | Admit: 2021-07-02 | Discharge: 2021-07-02 | Disposition: A | Discharge status: Home / Self Care | Payer: PPO | Attending: Urgent Care | Admitting: Urgent Care

## 2021-07-02 ENCOUNTER — Other Ambulatory Visit: Payer: Self-pay

## 2021-07-02 ENCOUNTER — Encounter: Payer: Self-pay | Admitting: Emergency Medicine

## 2021-07-02 DIAGNOSIS — J988 Other specified respiratory disorders: Secondary | ICD-10-CM

## 2021-07-02 DIAGNOSIS — R0602 Shortness of breath: Secondary | ICD-10-CM

## 2021-07-02 DIAGNOSIS — Z8639 Personal history of other endocrine, nutritional and metabolic disease: Secondary | ICD-10-CM

## 2021-07-02 DIAGNOSIS — R0981 Nasal congestion: Secondary | ICD-10-CM

## 2021-07-02 DIAGNOSIS — Z6379 Other stressful life events affecting family and household: Secondary | ICD-10-CM

## 2021-07-02 DIAGNOSIS — B9789 Other viral agents as the cause of diseases classified elsewhere: Secondary | ICD-10-CM

## 2021-07-02 DIAGNOSIS — R070 Pain in throat: Secondary | ICD-10-CM

## 2021-07-02 LAB — POCT RAPID STREP A (OFFICE): Rapid Strep A Screen: NEGATIVE

## 2021-07-02 MED ORDER — PSEUDOEPHEDRINE HCL 30 MG PO TABS: 30.0000 mg | ORAL_TABLET | Freq: Three times a day (TID) | ORAL | 0 refills | Status: DC | PRN

## 2021-07-02 MED ORDER — CETIRIZINE HCL 10 MG PO TABS: 10.0000 mg | ORAL_TABLET | Freq: Every day | ORAL | 0 refills | Status: DC

## 2021-07-02 NOTE — ED Triage Notes (Signed)
Pt is present today with nasal congestion, sore throat, slight SOB. Pt states sx started yesterday

## 2021-07-02 NOTE — Discharge Instructions (Signed)
We will notify you of your COVID-19 test results as they arrive and may take between 48-72 hours.  I encourage you to sign up for MyChart if you have not already done so as this can be the easiest way for [place] to communicate results to you online or through a phone app.  Generally, we only contact you if it is a positive COVID result.  In the meantime, if you develop worsening symptoms including fever, chest pain, shortness of breath despite our current treatment plan then please report to the emergency room as this may be a sign of worsening status from possible COVID-19 infection.  Otherwise, we will manage this as a viral syndrome. For sore throat or cough try using a honey-based tea. Use 3 teaspoons of honey with juice squeezed from half lemon. Place shaved pieces of ginger into 1/2-1 cup of water and warm over stove top. Then mix the ingredients and repeat every 4 hours as needed. Please take Tylenol 500mg -650mg  every 6 hours for aches and pains, fevers. Hydrate very well with at least 2 liters of water. Eat light meals such as soups to replenish electrolytes and soft fruits, veggies. Start an antihistamine like Zyrtec for postnasal drainage, sinus congestion.  You can take this together with pseudoephedrine (Sudafed) at a dose of 30 mg 2-3 times a day as needed for the same kind of congestion.

## 2021-07-02 NOTE — ED Provider Notes (Signed)
[place]   MRN: [id number] DOB: 1955/12/11  Subjective:   Craig [name] is a 65 y.o. male presenting for 1 day history of sinus congestion, malaise and fatigue, throat pain, slight shortness of breath and chest pressure.  Would like to make sure that he gets checked for COVID and strep.  Has a history of scarring in his lungs.  Smoked early on in his adulthood but quit many years ago.  No history of respiratory disorders.  No chest pain, body aches, fevers.  No current facility-administered medications for this encounter.  Current Outpatient Medications:    allopurinol (ZYLOPRIM) 100 MG tablet, Take 200 mg by mouth daily., Disp: , Rfl:    ALPRAZolam (XANAX) 0.5 MG tablet, Take 0.5 mg by mouth 3 (three) times daily., Disp: , Rfl:    amitriptyline (ELAVIL) 10 MG tablet, Take 10 mg by mouth at bedtime., Disp: , Rfl:    atenolol (TENORMIN) 25 MG tablet, Take 0.5 tablets (12.5 mg total) by mouth daily., Disp: 45 tablet, Rfl: 0   colchicine 0.6 MG tablet, Take 0.6 mg by mouth daily. As needed for gout flare., Disp: , Rfl:    Docusate Calcium (STOOL SOFTENER PO), Take 1-2 tablets by mouth daily as needed (constipation). , Disp: , Rfl:    indomethacin (INDOCIN) 25 MG capsule, Take 25 mg by mouth daily as needed for mild pain. If needed for gout, Disp: , Rfl:    nitrofurantoin, macrocrystal-monohydrate, (MACROBID) 100 MG capsule, Take 1 capsule (100 mg total) by mouth 2 (two) times daily., Disp: 10 capsule, Rfl: 0   RAPAFLO 8 MG CAPS capsule, Take 8 mg by mouth daily., Disp: , Rfl:    Allergies  Allergen Reactions   Metoprolol Anaphylaxis    Pt states cardiac arrest occurred when given. Dr Eliane Decree as the EDP.   Toprol Xl [Metoprolol Succinate] Anaphylaxis    Pt states cardiac arrest occurred when given. Dr Eliane Decree as the EDP.   Other     Pt states he does not do well with myocins   Sulfamethoxazole-Trimethoprim    Wheat Other (See Comments)    Was told according to a  skin test    Azithromycin Anxiety    Allergy if on long periods at a time   Ciprofloxacin Anxiety    nervous   Wheat Bran Rash    Was told according to a skin test    Zithromax [Azithromycin Dihydrate] Anxiety    Allergy if on long periods at a time    Past Medical History:  Diagnosis Date   Anxiety    Asthma    Chest pain    Nuclear, 2006, normal, / stress echo, June, 2012, normal, EF 60%   Diabetes mellitus    ?Pt notes he does not have; not clear how this ended up in chart   Diverticular disease    Ejection fraction    EF 60%, echo, June, 2012   Fatigue    Chronic   Gout    Hyperlipemia    Hypertension    Overweight(278.02)    Palpitations    30 day event recorder, 2006, negative   Sleep apnea    Dr. Halford Chessman   Tachycardia    January, 2013   Tobacco abuse    former   Urinary retention    Ventricular bigeminy      Past Surgical History:  Procedure Laterality Date   SHOULDER SURGERY     high school    Family History  Problem Relation Age of Onset   Hypertension Mother    Diverticulosis Sister    Diverticulosis Maternal Grandfather     Social History   Tobacco Use   Smoking status: Former   Smokeless tobacco: Never   Tobacco comments:    Only smoked x 1 year in high school.  Substance Use Topics   Alcohol use: No    Alcohol/week: 0.0 standard drinks   Drug use: No    ROS   Objective:   Vitals: BP 111/73   Pulse 70   Temp 97.7 F (36.5 C)   Resp 18   SpO2 96%   Physical Exam Constitutional:      General: He is not in acute distress.    Appearance: Normal appearance. He is well-developed and normal weight. He is not ill-appearing, toxic-appearing or diaphoretic.  HENT:     Head: Normocephalic and atraumatic.     Right Ear: Tympanic membrane, ear canal and external ear normal. No drainage, swelling or tenderness. No middle ear effusion. There is no impacted cerumen. Tympanic membrane is not erythematous.     Left Ear: Tympanic membrane,  ear canal and external ear normal. No drainage, swelling or tenderness.  No middle ear effusion. There is no impacted cerumen. Tympanic membrane is not erythematous.     Nose: Congestion present. No rhinorrhea.     Mouth/Throat:     Mouth: Mucous membranes are moist.     Pharynx: Oropharynx is clear. No pharyngeal swelling, oropharyngeal exudate, posterior oropharyngeal erythema or uvula swelling.  Eyes:     General: No scleral icterus.       Right eye: No discharge.        Left eye: No discharge.     Extraocular Movements: Extraocular movements intact.     Conjunctiva/sclera: Conjunctivae normal.     Pupils: Pupils are equal, round, and reactive to light.  Cardiovascular:     Rate and Rhythm: Normal rate and regular rhythm.     Heart sounds: Normal heart sounds. No murmur heard.   No friction rub. No gallop.  Pulmonary:     Effort: Pulmonary effort is normal. No respiratory distress.     Breath sounds: Normal breath sounds. No stridor. No wheezing, rhonchi or rales.  Musculoskeletal:     Cervical back: Normal range of motion and neck supple. No rigidity. No muscular tenderness.  Neurological:     General: No focal deficit present.     Mental Status: He is alert and oriented to person, place, and time.  Psychiatric:        Mood and Affect: Mood normal.        Behavior: Behavior normal.        Thought Content: Thought content normal.   DG Chest 2 View  Result Date: 07/02/2021 CLINICAL DATA:  Shortness of breath EXAM: CHEST - 2 VIEW COMPARISON:  None. FINDINGS: The heart size and mediastinal contours are within normal limits. No focal airspace consolidation, pleural effusion, or pneumothorax. The visualized skeletal structures are unremarkable. IMPRESSION: No active cardiopulmonary disease. Electronically Signed   By: Davina Poke D.O.   On: 07/02/2021 12:32     Results for orders placed or performed during the hospital encounter of 07/02/21 (from the past 24 hour(s))  POCT rapid  strep A     Status: None   Collection Time: 07/02/21 12:02 PM  Result Value Ref Range   Rapid Strep A Screen Negative Negative    Assessment and Plan :   PDMP  not reviewed this encounter.  1. Viral respiratory illness   2. Throat pain   3. Nasal congestion   4. Shortness of breath   5. Stress due to illness of family member   57. History of diabetes mellitus    Will manage for viral illness such as viral URI, viral syndrome, viral rhinitis, COVID-19, viral pharyngitis. Counseled patient on nature of COVID-19 including modes of transmission, diagnostic testing, management and supportive care.  Offered scripts for symptomatic relief. COVID 19 and strep culture are pending. Counseled patient on potential for adverse effects with medications prescribed/recommended today, ER and return-to-clinic precautions discussed, patient verbalized understanding.     Jaynee Eagles, PA-C 07/02/21 1240

## 2021-07-03 LAB — SARS-COV-2, NAA 2 DAY TAT

## 2021-07-03 LAB — NOVEL CORONAVIRUS, NAA: SARS-CoV-2, NAA: NOT DETECTED

## 2021-07-13 ENCOUNTER — Telehealth: Payer: Self-pay | Admitting: Cardiovascular Disease

## 2021-07-13 ENCOUNTER — Other Ambulatory Visit: Payer: Self-pay

## 2021-07-13 MED ORDER — ATENOLOL 25 MG PO TABS: 25.0000 mg | ORAL_TABLET | Freq: Every day | ORAL | 0 refills | Status: DC

## 2021-07-13 MED ORDER — ATENOLOL 25 MG PO TABS: 12.5000 mg | ORAL_TABLET | Freq: Every day | ORAL | 0 refills | Status: DC

## 2021-07-13 NOTE — Telephone Encounter (Signed)
Erlene Quan from [place] the patient's PCP called in the prescription for him, but increased the dose to 1 tablet daily. They would like to clarify if this is the dose he should be taking. Phone: [phone]

## 2021-07-13 NOTE — Telephone Encounter (Signed)
 *  STAT* If patient is at the pharmacy, call can be transferred to refill team.   1. Which medications need to be refilled? (please list name of each medication and dose if known)   atenolol (TENORMIN) 25 MG tablet     2. Which pharmacy/location (including street and city if local pharmacy) is medication to be sent to? [place], [address]  3. Do they need a 30 day or 90 day supply? 90 days  Pt needs refill today, he is out of meds

## 2021-07-13 NOTE — Telephone Encounter (Addendum)
**Note De-Identified  Obfuscation** I have corrected this Atenolol 25 mg #30/30 day supply with no refills and e-scribed it to [facility] to fill with a note to the pharmacist to please discard the prior Atenolol refill that I e-scribed to them as it was incorrect.  I also called and left a VM on [facility]'s VM (no answer X 2) advising that the prior e-scribed Atenolol RX was incorrect and that Atenolol 25 mg #30 is correct.

## 2021-07-13 NOTE — Telephone Encounter (Deleted)
**Note De-Identified  Obfuscation** Dr Burt Knack will need to ok this dose as the pts was taking Atenolol 12.5 mg daily at his last office visit on 08/01/20 with Richardson Dopp, PA-c.

## 2021-07-13 NOTE — Telephone Encounter (Addendum)
**Note De-Identified  Obfuscation** I have e-scribed the pts Atenolol 25 mg (he takes 12.5 mg daily) RX #15 with no refills to [facility]. I did leave a message on the pts refill that he is overdue for his f/u with Cardiology and must call to schedule and keep appt to continue refills.

## 2021-07-23 DIAGNOSIS — R3912 Poor urinary stream: Secondary | ICD-10-CM | POA: Diagnosis not present

## 2021-07-23 DIAGNOSIS — R3914 Feeling of incomplete bladder emptying: Secondary | ICD-10-CM | POA: Diagnosis not present

## 2021-07-23 DIAGNOSIS — N401 Enlarged prostate with lower urinary tract symptoms: Secondary | ICD-10-CM | POA: Diagnosis not present

## 2021-08-22 DIAGNOSIS — I10 Essential (primary) hypertension: Secondary | ICD-10-CM | POA: Diagnosis not present

## 2021-08-22 DIAGNOSIS — F5104 Psychophysiologic insomnia: Secondary | ICD-10-CM | POA: Diagnosis not present

## 2021-08-22 DIAGNOSIS — M5136 Other intervertebral disc degeneration, lumbar region: Secondary | ICD-10-CM | POA: Diagnosis not present

## 2021-08-22 DIAGNOSIS — M1009 Idiopathic gout, multiple sites: Secondary | ICD-10-CM | POA: Diagnosis not present

## 2021-08-22 DIAGNOSIS — M25512 Pain in left shoulder: Secondary | ICD-10-CM | POA: Diagnosis not present

## 2021-08-22 DIAGNOSIS — F32A Depression, unspecified: Secondary | ICD-10-CM | POA: Diagnosis not present

## 2021-10-08 DIAGNOSIS — N320 Bladder-neck obstruction: Secondary | ICD-10-CM | POA: Diagnosis not present

## 2021-10-10 DIAGNOSIS — M109 Gout, unspecified: Secondary | ICD-10-CM | POA: Diagnosis not present

## 2021-11-05 DIAGNOSIS — Z6839 Body mass index (BMI) 39.0-39.9, adult: Secondary | ICD-10-CM | POA: Diagnosis not present

## 2021-11-05 DIAGNOSIS — M87859 Other osteonecrosis, unspecified femur: Secondary | ICD-10-CM | POA: Diagnosis not present

## 2021-11-05 DIAGNOSIS — N320 Bladder-neck obstruction: Secondary | ICD-10-CM | POA: Diagnosis not present

## 2021-11-05 DIAGNOSIS — F418 Other specified anxiety disorders: Secondary | ICD-10-CM | POA: Diagnosis not present

## 2021-11-16 ENCOUNTER — Encounter: Payer: Self-pay | Admitting: Emergency Medicine

## 2021-11-16 ENCOUNTER — Other Ambulatory Visit: Payer: Self-pay

## 2021-11-16 ENCOUNTER — Emergency Department
Admission: EM | Admit: 2021-11-16 | Discharge: 2021-11-16 | Disposition: A | Discharge status: Home / Self Care | Payer: PPO | Source: Home / Self Care

## 2021-11-16 DIAGNOSIS — J309 Allergic rhinitis, unspecified: Secondary | ICD-10-CM

## 2021-11-16 DIAGNOSIS — J029 Acute pharyngitis, unspecified: Secondary | ICD-10-CM | POA: Diagnosis not present

## 2021-11-16 LAB — POC SARS CORONAVIRUS 2 AG -  ED: SARS Coronavirus 2 Ag: NEGATIVE

## 2021-11-16 MED ORDER — AZITHROMYCIN 250 MG PO TABS: 250.0000 mg | ORAL_TABLET | Freq: Every day | ORAL | 0 refills | Status: DC

## 2021-11-16 MED ORDER — FEXOFENADINE HCL 180 MG PO TABS: 180.0000 mg | ORAL_TABLET | Freq: Every day | ORAL | 0 refills | Status: DC

## 2021-11-16 NOTE — ED Provider Notes (Signed)
[facility]    CSN: [id number] Arrival date & time: 11/16/21  1003      History   Chief Complaint Chief Complaint  Patient presents with   Sore Throat    HPI Craig [name] is a 66 y.o. male.   HPI 66 year old male presents with sore/scratchy throat and slight cough that started yesterday.  Patient request COVID-19 test and reports that his wife currently has pneumonia.  PMH significant for tobacco abuse, T2DM, asthma, chest pain, and obesity.  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  Past Medical History:  Diagnosis Date   Anxiety    Asthma    Chest pain    Nuclear, 2006, normal, / stress echo, June, 2012, normal, EF 60%   Diabetes mellitus    ?Pt notes he does not have; not clear how this ended up in chart   Diverticular disease    Ejection fraction    EF 60%, echo, June, 2012   Fatigue    Chronic   Gout    Hyperlipemia    Hypertension    Overweight(278.02)    Palpitations    30 day event recorder, 2006, negative   Sleep apnea    Dr. Halford Chessman   Tachycardia    January, 2013   Tobacco abuse    former   Urinary retention    Ventricular bigeminy     Patient Active Problem List   Diagnosis Date Noted   Tachycardia    Ejection fraction    Palpitations    Chest pain    Sleep apnea    Gout    Anxiety    Tobacco abuse    Diabetes mellitus ([place])    Asthma    Diverticular disease    Overweight(278.02)    Fatigue     Past Surgical History:  Procedure Laterality Date   SHOULDER SURGERY     high school       Home Medications    Prior to Admission medications   Medication Sig Start Date End Date Taking? Authorizing Provider  azithromycin (ZITHROMAX) 250 MG tablet Take 1 tablet (250 mg total) by mouth daily. Take first 2 tablets together, then 1 every day until finished. 11/16/21  Yes Eliezer Lofts, FNP  fexofenadine [facility] ALLERGY) 180 MG tablet Take 1 tablet (180 mg total) by mouth daily for 15 days. 11/16/21 12/01/21 Yes Eliezer Lofts, FNP  allopurinol (ZYLOPRIM) 100 MG tablet Take 200 mg by mouth daily.    [provider]  ALPRAZolam Duanne Moron) 0.5 MG tablet Take 0.5 mg by mouth 3 (three) times daily.    [provider]  amitriptyline (ELAVIL) 10 MG tablet Take 10 mg by mouth at bedtime.    [provider]  atenolol (TENORMIN) 25 MG tablet Take 1 tablet (25 mg total) by mouth daily. 1st attempt: Please call [phone] to schedule your overdue f/u appt with cardiology to cont refills. 07/13/21 10/11/21  Sherren Mocha, MD  cetirizine (ZYRTEC ALLERGY) 10 MG tablet Take 1 tablet (10 mg total) by mouth daily. 07/02/21   Jaynee Eagles, PA-C  colchicine 0.6 MG tablet Take 0.6 mg by mouth daily. As needed for gout flare.    [provider]  Docusate Calcium (STOOL SOFTENER PO) Take 1-2 tablets by mouth daily as needed (constipation).     [provider]  indomethacin (INDOCIN) 25 MG capsule Take  25 mg by mouth daily as needed for mild pain. If needed for gout    [provider]  pseudoephedrine (SUDAFED) 30 MG tablet Take 1 tablet (30 mg total) by mouth every 8 (eight) hours as needed for congestion. 07/02/21   Jaynee Eagles, PA-C  RAPAFLO 8 MG CAPS capsule Take 8 mg by mouth daily. 09/19/16   [provider]    Family History Family History  Problem Relation Age of Onset   Hypertension Mother    Diverticulosis Sister    Diverticulosis Maternal Grandfather     Social History Social History   Tobacco Use   Smoking status: Former   Smokeless tobacco: Never   Tobacco comments:    Only smoked x 1 year in high school.  Vaping Use   Vaping Use: Never used  Substance Use Topics   Alcohol use: No    Alcohol/week: 0.0 standard drinks   Drug use: No     Allergies   Metoprolol, Toprol xl [metoprolol succinate], Other,  Sulfamethoxazole-trimethoprim, Wheat, Azithromycin, Ciprofloxacin, Wheat bran, and Zithromax [azithromycin dihydrate]   Review of Systems Review of Systems  HENT:  Positive for sore throat.   Respiratory:  Positive for cough.   All other systems reviewed and are negative.   Physical Exam Triage Vital Signs ED Triage Vitals [11/16/21 1016]  Enc Vitals Group     BP 115/75     Pulse Rate 60     Resp 16     Temp 97.8 F (36.6 C)     Temp Source Oral     SpO2 97 %     Weight 250 lb (113.4 kg)     Height '5\' 9"'$  (1.753 m)     Head Circumference      Peak Flow      Pain Score 0     Pain Loc      Pain Edu?      Excl. in [place]?    No data found.  Updated Vital Signs BP 115/75 (BP Location: Left Arm)    Pulse 60    Temp 97.8 F (36.6 C) (Oral)    Resp 16    Ht '5\' 9"'$  (1.753 m)    Wt 250 lb (113.4 kg)    SpO2 97%    BMI 36.92 kg/m     Physical Exam Vitals and nursing note reviewed.  Constitutional:      General: He is not in acute distress.    Appearance: Normal appearance. He is obese. He is not ill-appearing.  HENT:     Right Ear: Tympanic membrane, ear canal and external ear normal.     Left Ear: Tympanic membrane, ear canal and external ear normal.     Mouth/Throat:     Mouth: Mucous membranes are moist.     Pharynx: Oropharynx is clear.  Eyes:     Extraocular Movements: Extraocular movements intact.     Conjunctiva/sclera: Conjunctivae normal.     Pupils: Pupils are equal, round, and reactive to light.  Cardiovascular:     Rate and Rhythm: Normal rate and regular rhythm.     Pulses: Normal pulses.     Heart sounds: Normal heart sounds.  Pulmonary:     Effort: Pulmonary effort is normal.     Breath sounds: Normal breath sounds.  Musculoskeletal:     Cervical back: Normal range of motion and neck supple.  Skin:    General: Skin is warm and dry.  Neurological:     General:  No focal deficit present.     Mental Status: He is alert and oriented to person, place, and  time.     UC Treatments / Results  Labs (all labs ordered are listed, but only abnormal results are displayed) Labs Reviewed  POC SARS CORONAVIRUS 2 AG -  ED    EKG   Radiology No results found.  Procedures Procedures (including critical care time)  Medications Ordered in UC Medications - No data to display  Initial Impression / Assessment and Plan / UC Course  I have reviewed the triage vital signs and the nursing notes.  Pertinent labs & imaging results that were available during my care of the patient were reviewed by me and considered in my medical decision making (see chart for details).     MDM: 1.  Acute pharyngitis-Rx'd Zithromax; 2.  Allergic rhinitis-Rx'd Allegra. Informed patient that COVID-19 test this morning was negative.  Instructed patient to take Allegra daily for the next 5 days for concurrent postnasal drip/drainage.  Advised patient if sore throat worsens and/or unresolved he may start Zithromax (patient denies medication allergy for this medication and reports taking many times without adverse reaction).  Advised once starting this antibiotic please take with food to completion.  Encouraged patient to increase daily water intake while taking these medications.  Patient discharged home, hemodynamically stable. Final Clinical Impressions(s) / UC Diagnoses   Final diagnoses:  Acute pharyngitis, unspecified etiology  Allergic rhinitis, unspecified seasonality, unspecified trigger     Discharge Instructions      Informed patient that COVID-19 test this morning was negative.  Instructed patient to take Allegra daily for the next 5 days for concurrent postnasal drip/drainage.  Advised patient if sore throat worsens and/or unresolved he may start Zithromax (patient denies medication allergy for this medication and reports taking many times without adverse reaction).  Advised once starting this antibiotic please take with food to completion.  Encouraged patient  to increase daily water intake while taking these medications.     ED Prescriptions     Medication Sig Dispense Auth. Provider   fexofenadine (ALLEGRA ALLERGY) 180 MG tablet Take 1 tablet (180 mg total) by mouth daily for 15 days. 15 tablet Eliezer Lofts, FNP   azithromycin (ZITHROMAX) 250 MG tablet Take 1 tablet (250 mg total) by mouth daily. Take first 2 tablets together, then 1 every day until finished. 6 tablet Eliezer Lofts, FNP      PDMP not reviewed this encounter.   Eliezer Lofts, [place] 11/16/21 1105

## 2021-11-16 NOTE — Discharge Instructions (Addendum)
Informed patient that COVID-19 test this morning was negative.  Instructed patient to take Allegra daily for the next 5 days for concurrent postnasal drip/drainage.  Advised patient if sore throat worsens and/or unresolved he may start Zithromax (patient denies medication allergy for this medication and reports taking many times without adverse reaction).  Advised once starting this antibiotic please take with food to completion.  Encouraged patient to increase daily water intake while taking these medications. ?

## 2021-11-16 NOTE — ED Triage Notes (Addendum)
Scratchy throat, slight cough,started yesterday wants Covid test. ?Wife has pnemonia ?

## 2021-11-19 DIAGNOSIS — J069 Acute upper respiratory infection, unspecified: Secondary | ICD-10-CM | POA: Diagnosis not present

## 2021-11-22 ENCOUNTER — Other Ambulatory Visit (HOSPITAL_COMMUNITY): Payer: Self-pay | Admitting: Internal Medicine

## 2021-11-22 ENCOUNTER — Ambulatory Visit (HOSPITAL_COMMUNITY)
Admission: RE | Admit: 2021-11-22 | Discharge: 2021-11-22 | Disposition: A | Discharge status: Home / Self Care | Payer: PPO | Source: Ambulatory Visit | Attending: Internal Medicine | Admitting: Internal Medicine

## 2021-11-22 ENCOUNTER — Other Ambulatory Visit: Payer: Self-pay

## 2021-11-22 DIAGNOSIS — R059 Cough, unspecified: Secondary | ICD-10-CM | POA: Insufficient documentation

## 2021-12-19 DIAGNOSIS — R8290 Unspecified abnormal findings in urine: Secondary | ICD-10-CM | POA: Diagnosis not present

## 2021-12-19 DIAGNOSIS — R350 Frequency of micturition: Secondary | ICD-10-CM | POA: Diagnosis not present

## 2021-12-26 DIAGNOSIS — R338 Other retention of urine: Secondary | ICD-10-CM | POA: Diagnosis not present

## 2022-01-04 DIAGNOSIS — R338 Other retention of urine: Secondary | ICD-10-CM | POA: Diagnosis not present

## 2022-01-04 DIAGNOSIS — B350 Tinea barbae and tinea capitis: Secondary | ICD-10-CM | POA: Diagnosis not present

## 2022-01-11 DIAGNOSIS — R3914 Feeling of incomplete bladder emptying: Secondary | ICD-10-CM | POA: Diagnosis not present

## 2022-01-11 DIAGNOSIS — N320 Bladder-neck obstruction: Secondary | ICD-10-CM | POA: Diagnosis not present

## 2022-01-22 DIAGNOSIS — R3914 Feeling of incomplete bladder emptying: Secondary | ICD-10-CM | POA: Diagnosis not present

## 2022-01-24 ENCOUNTER — Other Ambulatory Visit: Payer: Self-pay | Admitting: Urology

## 2022-01-25 DIAGNOSIS — R300 Dysuria: Secondary | ICD-10-CM | POA: Diagnosis not present

## 2022-01-28 DIAGNOSIS — R339 Retention of urine, unspecified: Secondary | ICD-10-CM | POA: Diagnosis not present

## 2022-01-30 DIAGNOSIS — Z6838 Body mass index (BMI) 38.0-38.9, adult: Secondary | ICD-10-CM | POA: Diagnosis not present

## 2022-01-30 DIAGNOSIS — E662 Morbid (severe) obesity with alveolar hypoventilation: Secondary | ICD-10-CM | POA: Diagnosis not present

## 2022-01-30 DIAGNOSIS — M1009 Idiopathic gout, multiple sites: Secondary | ICD-10-CM | POA: Diagnosis not present

## 2022-01-30 DIAGNOSIS — I10 Essential (primary) hypertension: Secondary | ICD-10-CM | POA: Diagnosis not present

## 2022-01-30 DIAGNOSIS — F5104 Psychophysiologic insomnia: Secondary | ICD-10-CM | POA: Diagnosis not present

## 2022-01-30 DIAGNOSIS — F418 Other specified anxiety disorders: Secondary | ICD-10-CM | POA: Diagnosis not present

## 2022-01-30 DIAGNOSIS — N401 Enlarged prostate with lower urinary tract symptoms: Secondary | ICD-10-CM | POA: Diagnosis not present

## 2022-01-30 DIAGNOSIS — L304 Erythema intertrigo: Secondary | ICD-10-CM | POA: Diagnosis not present

## 2022-01-31 DIAGNOSIS — R3912 Poor urinary stream: Secondary | ICD-10-CM | POA: Diagnosis not present

## 2022-01-31 DIAGNOSIS — N401 Enlarged prostate with lower urinary tract symptoms: Secondary | ICD-10-CM | POA: Diagnosis not present

## 2022-01-31 DIAGNOSIS — R3914 Feeling of incomplete bladder emptying: Secondary | ICD-10-CM | POA: Diagnosis not present

## 2022-02-02 ENCOUNTER — Other Ambulatory Visit: Payer: Self-pay

## 2022-02-02 ENCOUNTER — Emergency Department (HOSPITAL_COMMUNITY)
Admission: EM | Admit: 2022-02-02 | Discharge: 2022-02-02 | Disposition: A | Discharge status: Home / Self Care | Payer: PPO | Attending: Emergency Medicine | Admitting: Emergency Medicine

## 2022-02-02 ENCOUNTER — Encounter (HOSPITAL_COMMUNITY): Payer: Self-pay | Admitting: *Deleted

## 2022-02-02 DIAGNOSIS — R339 Retention of urine, unspecified: Secondary | ICD-10-CM | POA: Diagnosis not present

## 2022-02-02 LAB — URINALYSIS, ROUTINE W REFLEX MICROSCOPIC
Bilirubin Urine: NEGATIVE
Glucose, UA: NEGATIVE mg/dL
Hgb urine dipstick: NEGATIVE
Ketones, ur: NEGATIVE mg/dL
Leukocytes,Ua: NEGATIVE
Nitrite: NEGATIVE
Protein, ur: NEGATIVE mg/dL
Specific Gravity, Urine: 1.004 — ABNORMAL LOW (ref 1.005–1.030)
pH: 6 (ref 5.0–8.0)

## 2022-02-02 NOTE — ED Provider Notes (Signed)
[facility] DEPT Provider Note   CSN: [id number] Arrival date & time: 02/02/22  1019     History  Chief Complaint  Patient presents with   Urinary Retention    Craig [name] is a 66 y.o. male.  Patient presents to the hospital with a chief concern of urinary retention.  He states that yesterday he developed some retention but was able to void this morning.  He still feels that he is retaining urine.  He in and out caths at home twice daily.  2 weeks ago he had a cath done by urology.  He is followed by [facility] for prostate issues and saw them either Thursday or Friday this week.  He was informed that he had the option of a Foley catheter that could be maintained until surgery which is planned for later in June.  The patient declined a Foley at that time.  Upon arrival bladder scan showed a volume of 275 mL.  The patient was able to void on his own without catheter and postvoid residual was negligible.  Plan for transurethral resection of the prostate on June 9.  HPI     Home Medications Prior to Admission medications   Medication Sig Start Date End Date Taking? Authorizing Provider  allopurinol (ZYLOPRIM) 100 MG tablet Take 200 mg by mouth daily.    [provider]  ALPRAZolam Duanne Moron) 0.5 MG tablet Take 0.5 mg by mouth 3 (three) times daily.    [provider]  amitriptyline (ELAVIL) 10 MG tablet Take 10 mg by mouth at bedtime.    [provider]  atenolol (TENORMIN) 25 MG tablet Take 1 tablet (25 mg total) by mouth daily. 1st attempt: Please call [phone] to schedule your overdue f/u appt with cardiology to cont refills. 07/13/21 10/11/21  Sherren Mocha, MD  azithromycin (ZITHROMAX) 250 MG tablet Take 1 tablet (250 mg total) by mouth daily. Take first 2 tablets together, then 1 every day until finished. 11/16/21   Eliezer Lofts, FNP  cetirizine (ZYRTEC ALLERGY) 10 MG tablet Take 1 tablet (10 mg total) by mouth daily.  07/02/21   Jaynee Eagles, PA-C  colchicine 0.6 MG tablet Take 0.6 mg by mouth daily. As needed for gout flare.    [provider]  Docusate Calcium (STOOL SOFTENER PO) Take 1-2 tablets by mouth daily as needed (constipation).     [provider]  fexofenadine (ALLEGRA ALLERGY) 180 MG tablet Take 1 tablet (180 mg total) by mouth daily for 15 days. 11/16/21 12/01/21  Eliezer Lofts, FNP  indomethacin (INDOCIN) 25 MG capsule Take 25 mg by mouth daily as needed for mild pain. If needed for gout    [provider]  pseudoephedrine (SUDAFED) 30 MG tablet Take 1 tablet (30 mg total) by mouth every 8 (eight) hours as needed for congestion. 07/02/21   Jaynee Eagles, PA-C  RAPAFLO 8 MG CAPS capsule Take 8 mg by mouth daily. 09/19/16   [provider]      Allergies    Metoprolol, Toprol xl [metoprolol succinate], Other, Sulfamethoxazole-trimethoprim, Wheat, Azithromycin, Ciprofloxacin, Wheat bran, and Zithromax [azithromycin dihydrate]    Review of Systems   Review of Systems  Constitutional:  Negative for fever.  Respiratory:  Negative for shortness of breath.   Gastrointestinal:  Negative for abdominal pain, nausea and vomiting.  Genitourinary:  Negative for dysuria.       Urinary retention   Physical Exam Updated Vital Signs BP 134/80 (BP Location: Left Arm)  Pulse (!) 59   Temp 97.7 F (36.5 C) (Oral)   Resp 16   Ht '5\' 9"'$  (1.753 m)   Wt 112.5 kg   SpO2 97%   BMI 36.62 kg/m  Physical Exam Vitals and nursing note reviewed.  Constitutional:      General: He is not in acute distress.    Appearance: He is well-developed.  HENT:     Head: Normocephalic and atraumatic.  Eyes:     Conjunctiva/sclera: Conjunctivae normal.  Cardiovascular:     Rate and Rhythm: Normal rate.  Pulmonary:     Effort: Pulmonary effort is normal.  Abdominal:     Palpations: Abdomen is soft.     Tenderness: There is no abdominal tenderness.  Musculoskeletal:        General: No  swelling.     Cervical back: Neck supple.  Skin:    General: Skin is warm and dry.     Capillary Refill: Capillary refill takes less than 2 seconds.  Neurological:     Mental Status: He is alert.  Psychiatric:     Comments: Patient seems anxious and admits to the same    ED Results / Procedures / Treatments   Labs (all labs ordered are listed, but only abnormal results are displayed) Labs Reviewed  URINALYSIS, ROUTINE W REFLEX MICROSCOPIC - Abnormal; Notable for the following components:      Result Value   Color, Urine STRAW (*)    Specific Gravity, Urine 1.004 (*)    All other components within normal limits    EKG None  Radiology No results found.  Procedures Procedures    Medications Ordered in ED Medications - No data to display  ED Course/ Medical Decision Making/ A&P                           Medical Decision Making Amount and/or Complexity of Data Reviewed Labs: ordered.   The patient presents with a chief complaint of urinary retention.  Patient's bladder scan and postvoid residual show no significant retention.  The patient was able to urinate on his own without the assistance of a catheter.  I briefly considered having a Foley catheter placed with the patient following up with urology on Tuesday after the holiday weekend.  The infection risk seems to great based on the very minimal residual urine noted on bladder scan.  I discussed this with the patient and he voiced understanding.  The patient admits to significant anxiety and is nervous about the upcoming procedure as well.  There is no intervention that is needed today.  No need for imaging or further lab work.  I did order a urinalysis.  Relevant results include straw-colored urine with a specific gravity of 1.004.  Negative for any signs of infection.  The patient should follow-up with his urologist.  If he continues to have issues over the weekend he may return to an emergency department if needed  and at that time I believe a Foley catheter would be a consideration.  Discharge home  Final Clinical Impression(s) / ED Diagnoses Final diagnoses:  Urinary retention    Rx / DC Orders ED Discharge Orders     None         Ronny Bacon 02/02/22 1157    Luna Fuse, MD 02/12/22 [phone]

## 2022-02-02 NOTE — Discharge Instructions (Addendum)
You were seen today for possible urinary retention.  As discussed, no significant retention was noted on your bladder scan.  I recommend continuing your in and out catheter regimen at home and following up with urology early next week.

## 2022-02-02 NOTE — ED Triage Notes (Signed)
Pt has history of urinary retention, yesterday developed some retention, voided this morning but still feel tight. Last Cath 2 weeks ago

## 2022-02-07 NOTE — Progress Notes (Signed)
Called patient regarding appointment. Stated his surgery is cancelled, unsure when it will be rescheduled. He spoke with Dr. Carlton Adam office and they are aware.  COVID Vaccine Completed:  Date of COVID positive in last 90 days:  PCP - Allyn Kenner, MD Cardiologist - Sherren Mocha, MD  Chest x-ray - 11/25/21 Epic EKG -  Stress Test - 2006 ECHO - greater than 2 years Cardiac Cath -  Pacemaker/ICD device last checked: Spinal Cord Stimulator:  Bowel Prep -   Sleep Study -  CPAP -   Fasting Blood Sugar -  Checks Blood Sugar _____ times a day  Blood Thinner Instructions: Aspirin Instructions: Last Dose:  Activity level:  Can go up a flight of stairs and perform activities of daily living without stopping and without symptoms of chest pain or shortness of breath.   Able to exercise without symptoms  Unable to go up a flight of stairs without symptoms of      Anesthesia review:   Patient denies shortness of breath, fever, cough and chest pain at PAT appointment   Patient verbalized understanding of instructions that were given to them at the PAT appointment. Patient was also instructed that they will need to review over the PAT instructions again at home before surgery.

## 2022-02-07 NOTE — Patient Instructions (Signed)
DUE TO COVID-19 ONLY TWO VISITORS  (aged 64 and older)  ARE ALLOWED TO COME WITH YOU AND STAY IN THE WAITING ROOM ONLY DURING PRE OP AND PROCEDURE.   **NO VISITORS ARE ALLOWED IN THE SHORT STAY AREA OR RECOVERY ROOM!!**  IF YOU WILL BE ADMITTED INTO THE HOSPITAL YOU ARE ALLOWED ONLY FOUR SUPPORT PEOPLE DURING VISITATION HOURS ONLY (7 AM -8PM)   The support person(s) must pass our screening, gel in and out, and wear a mask at all times, including in the patient's room. Patients must also wear a mask when staff or their support person are in the room. Visitors GUEST BADGE MUST BE WORN VISIBLY  One adult visitor may remain with you overnight and MUST be in the room by 8 P.M.     Your procedure is scheduled on: 02/15/22   Report to [facility] Main Entrance    Report to admitting at 9:15 AM   Call this number if you have problems the morning of surgery [phone]   Do not eat food :After Midnight.   After Midnight you may have the following liquids until 8:30 AM DAY OF SURGERY  Water Black Coffee (sugar ok, NO MILK/CREAM OR CREAMERS)  Tea (sugar ok, NO MILK/CREAM OR CREAMERS) regular and decaf                             Plain Jell-O (NO RED)                                           Fruit ices (not with fruit pulp, NO RED)                                     Popsicles (NO RED)                                                                  Juice: apple, WHITE grape, WHITE cranberry Sports drinks like Gatorade (NO RED) Clear broth(vegetable,chicken,beef)  FOLLOW BOWEL PREP AND ANY ADDITIONAL PRE OP INSTRUCTIONS YOU RECEIVED FROM YOUR SURGEON'S OFFICE!!!     Oral Hygiene is also important to reduce your risk of infection.                                    Remember - BRUSH YOUR TEETH THE MORNING OF SURGERY WITH YOUR REGULAR TOOTHPASTE   Do NOT smoke after Midnight   Take these medicines the morning of surgery with A SIP OF WATER: Xanax, Atenolol, Zyrtec  DO NOT TAKE ANY  ORAL DIABETIC MEDICATIONS DAY OF YOUR SURGERY  How to Manage Your Diabetes Before and After Surgery  Why is it important to control my blood sugar before and after surgery? Improving blood sugar levels before and after surgery helps healing and can limit problems. A way of improving blood sugar control is eating a healthy diet by:  Eating less sugar and carbohydrates  Increasing activity/exercise  Talking with your  doctor about reaching your blood sugar goals High blood sugars (greater than 180 mg/dL) can raise your risk of infections and slow your recovery, so you will need to focus on controlling your diabetes during the weeks before surgery. Make sure that the doctor who takes care of your diabetes knows about your planned surgery including the date and location.  How do I manage my blood sugar before surgery? Check your blood sugar at least 4 times a day, starting 2 days before surgery, to make sure that the level is not too high or low. Check your blood sugar the morning of your surgery when you wake up and every 2 hours until you get to the Short Stay unit. If your blood sugar is less than 70 mg/dL, you will need to treat for low blood sugar: Do not take insulin. Treat a low blood sugar (less than 70 mg/dL) with  cup of clear juice (cranberry or apple), 4 glucose tablets, OR glucose gel. Recheck blood sugar in 15 minutes after treatment (to make sure it is greater than 70 mg/dL). If your blood sugar is not greater than 70 mg/dL on recheck, call [phone] for further instructions. Report your blood sugar to the short stay nurse when you get to Short Stay.  If you are admitted to the hospital after surgery: Your blood sugar will be checked by the staff and you will probably be given insulin after surgery (instead of oral diabetes medicines) to make sure you have good blood sugar levels. The goal for blood sugar control after surgery is 80-180 mg/dL.   WHAT DO I DO ABOUT MY  DIABETES MEDICATION?  Do not take oral diabetes medicines (pills) the morning of surgery.  THE NIGHT BEFORE SURGERY, take     units of       insulin.       THE MORNING OF SURGERY, take   units of         insulin.  The day of surgery, do not take other diabetes injectables, including Byetta (exenatide), Bydureon (exenatide ER), Victoza (liraglutide), or Trulicity (dulaglutide).  If your CBG is greater than 220 mg/dL, you may take  of your sliding scale  (correction) dose of insulin.     Reviewed and Endorsed by [facility] Patient Education Committee, August 2015   Bring CPAP mask and tubing day of surgery.                              You may not have any metal on your body including jewelry, and body piercing             Do not wear lotions, powders, cologne, or deodorant              Men may shave face and neck.   Do not bring valuables to the hospital. [place].   Contacts, dentures or bridgework may not be worn into surgery.   Bring small overnight bag day of surgery.    Patients discharged on the day of surgery will not be allowed to drive home.  Someone NEEDS to stay with you for the first 24 hours after anesthesia.   Special Instructions: Bring a copy of your healthcare power of attorney and living will documents         the day of surgery if you  haven't scanned them before.              Please read over the following fact sheets you were given: IF YOU HAVE QUESTIONS ABOUT YOUR PRE-OP INSTRUCTIONS PLEASE CALL [facility] - Preparing for Surgery Before surgery, you can play an important role.  Because skin is not sterile, your skin needs to be as free of germs as possible.  You can reduce the number of germs on your skin by washing with CHG (chlorahexidine gluconate) soap before surgery.  CHG is an antiseptic cleaner which kills germs and bonds with the skin to continue killing germs even after  washing. Please DO NOT use if you have an allergy to CHG or antibacterial soaps.  If your skin becomes reddened/irritated stop using the CHG and inform your nurse when you arrive at Short Stay. Do not shave (including legs and underarms) for at least 48 hours prior to the first CHG shower.  You may shave your face/neck.  Please follow these instructions carefully:  1.  Shower with CHG Soap the night before surgery and the  morning of surgery.  2.  If you choose to wash your hair, wash your hair first as usual with your normal  shampoo.  3.  After you shampoo, rinse your hair and body thoroughly to remove the shampoo.                             4.  Use CHG as you would any other liquid soap.  You can apply chg directly to the skin and wash.  Gently with a scrungie or clean washcloth.  5.  Apply the CHG Soap to your body ONLY FROM THE NECK DOWN.   Do   not use on face/ open                           Wound or open sores. Avoid contact with eyes, ears mouth and   genitals (private parts).                       Wash face,  Genitals (private parts) with your normal soap.             6.  Wash thoroughly, paying special attention to the area where your    surgery  will be performed.  7.  Thoroughly rinse your body with warm water from the neck down.  8.  DO NOT shower/wash with your normal soap after using and rinsing off the CHG Soap.                9.  Pat yourself dry with a clean towel.            10.  Wear clean pajamas.            11.  Place clean sheets on your bed the night of your first shower and do not  sleep with pets. Day of Surgery : Do not apply any lotions/deodorants the morning of surgery.  Please wear clean clothes to the hospital/surgery center.  FAILURE TO FOLLOW THESE INSTRUCTIONS MAY RESULT IN THE CANCELLATION OF YOUR SURGERY  PATIENT SIGNATURE_________________________________  NURSE  SIGNATURE__________________________________  ________________________________________________________________________

## 2022-02-08 ENCOUNTER — Encounter (HOSPITAL_COMMUNITY)
Admission: RE | Admit: 2022-02-08 | Discharge: 2022-02-08 | Disposition: A | Discharge status: Home / Self Care | Payer: PPO | Source: Ambulatory Visit | Attending: Anesthesiology | Admitting: Anesthesiology

## 2022-02-08 DIAGNOSIS — E119 Type 2 diabetes mellitus without complications: Secondary | ICD-10-CM

## 2022-02-15 ENCOUNTER — Ambulatory Visit (HOSPITAL_COMMUNITY): Admission: RE | Admit: 2022-02-15 | Payer: PPO | Source: Home / Self Care | Admitting: Urology

## 2022-02-15 ENCOUNTER — Encounter (HOSPITAL_COMMUNITY): Admission: RE | Payer: Self-pay | Source: Home / Self Care

## 2022-02-15 DIAGNOSIS — L821 Other seborrheic keratosis: Secondary | ICD-10-CM | POA: Diagnosis not present

## 2022-02-15 DIAGNOSIS — L304 Erythema intertrigo: Secondary | ICD-10-CM | POA: Diagnosis not present

## 2022-02-15 DIAGNOSIS — L308 Other specified dermatitis: Secondary | ICD-10-CM | POA: Diagnosis not present

## 2022-02-15 DIAGNOSIS — L820 Inflamed seborrheic keratosis: Secondary | ICD-10-CM | POA: Diagnosis not present

## 2022-02-15 DIAGNOSIS — L573 Poikiloderma of Civatte: Secondary | ICD-10-CM | POA: Diagnosis not present

## 2022-02-15 DIAGNOSIS — D1801 Hemangioma of skin and subcutaneous tissue: Secondary | ICD-10-CM | POA: Diagnosis not present

## 2022-02-15 SURGERY — TURP (TRANSURETHRAL RESECTION OF PROSTATE)
Anesthesia: General

## 2022-02-18 DIAGNOSIS — N401 Enlarged prostate with lower urinary tract symptoms: Secondary | ICD-10-CM | POA: Diagnosis not present

## 2022-02-18 DIAGNOSIS — R3914 Feeling of incomplete bladder emptying: Secondary | ICD-10-CM | POA: Diagnosis not present

## 2022-02-22 DIAGNOSIS — N401 Enlarged prostate with lower urinary tract symptoms: Secondary | ICD-10-CM | POA: Diagnosis not present

## 2022-02-22 DIAGNOSIS — R3914 Feeling of incomplete bladder emptying: Secondary | ICD-10-CM | POA: Diagnosis not present

## 2022-02-26 DIAGNOSIS — Z0001 Encounter for general adult medical examination with abnormal findings: Secondary | ICD-10-CM | POA: Diagnosis not present

## 2022-02-26 DIAGNOSIS — F32A Depression, unspecified: Secondary | ICD-10-CM | POA: Diagnosis not present

## 2022-02-26 DIAGNOSIS — R630 Anorexia: Secondary | ICD-10-CM | POA: Diagnosis not present

## 2022-02-26 DIAGNOSIS — N401 Enlarged prostate with lower urinary tract symptoms: Secondary | ICD-10-CM | POA: Diagnosis not present

## 2022-02-26 DIAGNOSIS — R6883 Chills (without fever): Secondary | ICD-10-CM | POA: Diagnosis not present

## 2022-03-02 ENCOUNTER — Emergency Department (HOSPITAL_COMMUNITY)
Admission: EM | Admit: 2022-03-02 | Discharge: 2022-03-02 | Disposition: A | Discharge status: Home / Self Care | Payer: PPO | Attending: Emergency Medicine | Admitting: Emergency Medicine

## 2022-03-02 ENCOUNTER — Encounter (HOSPITAL_COMMUNITY): Payer: Self-pay | Admitting: *Deleted

## 2022-03-02 ENCOUNTER — Emergency Department (HOSPITAL_COMMUNITY)
Admission: EM | Admit: 2022-03-02 | Discharge: 2022-03-02 | Discharge status: Against Medical Advice | Payer: PPO | Source: Home / Self Care | Attending: Student | Admitting: Student

## 2022-03-02 ENCOUNTER — Other Ambulatory Visit: Payer: Self-pay

## 2022-03-02 ENCOUNTER — Encounter (HOSPITAL_COMMUNITY): Payer: Self-pay | Admitting: Emergency Medicine

## 2022-03-02 DIAGNOSIS — E119 Type 2 diabetes mellitus without complications: Secondary | ICD-10-CM | POA: Insufficient documentation

## 2022-03-02 DIAGNOSIS — I10 Essential (primary) hypertension: Secondary | ICD-10-CM | POA: Insufficient documentation

## 2022-03-02 DIAGNOSIS — R109 Unspecified abdominal pain: Secondary | ICD-10-CM | POA: Insufficient documentation

## 2022-03-02 DIAGNOSIS — Z79899 Other long term (current) drug therapy: Secondary | ICD-10-CM | POA: Insufficient documentation

## 2022-03-02 DIAGNOSIS — R339 Retention of urine, unspecified: Secondary | ICD-10-CM

## 2022-03-02 LAB — URINALYSIS, ROUTINE W REFLEX MICROSCOPIC
Bilirubin Urine: NEGATIVE
Glucose, UA: NEGATIVE mg/dL
Hgb urine dipstick: NEGATIVE
Ketones, ur: NEGATIVE mg/dL
Leukocytes,Ua: NEGATIVE
Nitrite: NEGATIVE
Protein, ur: NEGATIVE mg/dL
Specific Gravity, Urine: 1.005 (ref 1.005–1.030)
pH: 6 (ref 5.0–8.0)

## 2022-03-02 NOTE — ED Notes (Signed)
Received urology cart.

## 2022-03-02 NOTE — ED Provider Notes (Signed)
[facility] EMERGENCY DEPARTMENT Provider Note   CSN: [id number] Arrival date & time: 03/02/22  1622     History  Chief Complaint  Patient presents with   Urinary Retention    Craig [name] is a 66 y.o. male.  HPI  Patient with medical tape hypertension, hyperlipidemia, BPH, diabetes, frequent urinary retention presents today due to urinary retention.  Started yesterday, patient typically self caths at home.  States he was having difficulty self cathing so he arrived at [facility] long ED earlier today for evaluation.  At that time a Foley was in place, there is output and urine was obtained without signs of infection.  Patient requested the Foley be removed and it was.  Patient tried to self cath after discharge and has not been successful.  He is back in the ED today requesting Foley.  He denies any fevers, nausea, vomiting, hematuria, new back pain.  States this happened previously and he is followed by Dr. Marlou Porch with urology  Home Medications Prior to Admission medications   Medication Sig Start Date End Date Taking? Authorizing Provider  allopurinol (ZYLOPRIM) 100 MG tablet Take 200 mg by mouth daily.    [provider]  ALPRAZolam Prudy Feeler) 0.5 MG tablet Take 0.5 mg by mouth 3 (three) times daily.    [provider]  amitriptyline (ELAVIL) 10 MG tablet Take 10 mg by mouth at bedtime.    [provider]  atenolol (TENORMIN) 25 MG tablet Take 1 tablet (25 mg total) by mouth daily. 1st attempt: Please call [phone] to schedule your overdue f/u appt with cardiology to cont refills. 07/13/21 10/11/21  Tonny Bollman, MD  azithromycin (ZITHROMAX) 250 MG tablet Take 1 tablet (250 mg total) by mouth daily. Take first 2 tablets together, then 1 every day until finished. 11/16/21   Trevor Iha, FNP  cetirizine (ZYRTEC ALLERGY) 10 MG tablet Take 1 tablet (10 mg total) by mouth daily. 07/02/21   Wallis Bamberg, PA-C  colchicine 0.6 MG tablet Take 0.6 mg by mouth  daily. As needed for gout flare.    [provider]  Docusate Calcium (STOOL SOFTENER PO) Take 1-2 tablets by mouth daily as needed (constipation).     [provider]  fexofenadine (ALLEGRA ALLERGY) 180 MG tablet Take 1 tablet (180 mg total) by mouth daily for 15 days. 11/16/21 12/01/21  Trevor Iha, FNP  indomethacin (INDOCIN) 25 MG capsule Take 25 mg by mouth daily as needed for mild pain. If needed for gout    [provider]  pseudoephedrine (SUDAFED) 30 MG tablet Take 1 tablet (30 mg total) by mouth every 8 (eight) hours as needed for congestion. 07/02/21   Wallis Bamberg, PA-C  RAPAFLO 8 MG CAPS capsule Take 8 mg by mouth daily. 09/19/16   [provider]      Allergies    Metoprolol, Toprol xl [metoprolol succinate], Other, Sulfamethoxazole-trimethoprim, Wheat, Azithromycin, Ciprofloxacin, Wheat bran, and Zithromax [azithromycin dihydrate]    Review of Systems   Review of Systems  Physical Exam Updated Vital Signs BP 117/73 (BP Location: Right Arm)   Pulse 68   Temp 98 F (36.7 C) (Oral)   Resp 18   SpO2 97%  Physical Exam Vitals and nursing note reviewed. Exam conducted with a chaperone present.  Constitutional:      Appearance: Normal appearance.  HENT:     Head: Normocephalic and atraumatic.  Eyes:     General: No scleral icterus.       Right eye:  No discharge.        Left eye: No discharge.     Extraocular Movements: Extraocular movements intact.     Pupils: Pupils are equal, round, and reactive to light.  Cardiovascular:     Rate and Rhythm: Normal rate and regular rhythm.     Pulses: Normal pulses.     Heart sounds: Normal heart sounds. No murmur heard.    No friction rub. No gallop.  Pulmonary:     Effort: Pulmonary effort is normal. No respiratory distress.     Breath sounds: Normal breath sounds.  Abdominal:     General: Abdomen is flat. Bowel sounds are normal. There is no distension.     Palpations: Abdomen is soft.      Tenderness: There is no abdominal tenderness.     Comments: Abdomen is soft and nontender  Skin:    General: Skin is warm and dry.     Coloration: Skin is not jaundiced.  Neurological:     Mental Status: He is alert. Mental status is at baseline.     Coordination: Coordination normal.     ED Results / Procedures / Treatments   Labs (all labs ordered are listed, but only abnormal results are displayed) Labs Reviewed - No data to display  EKG None  Radiology No results found.  Procedures Procedures    Medications Ordered in ED Medications - No data to display  ED Course/ Medical Decision Making/ A&P                           Medical Decision Making  Patient presents today due to urinary retention.Been acute on chronic problem given his history of BPH and frequent urinary retention requiring self cath and sometimes Foley catheter.  I reviewed the ED note from earlier today, UA was obtained and negative for any signs of UTI.  On my exam patient is stable, his abdomen is soft.  He is not septic and there is no CVA tenderness.  BP 117/73 (BP Location: Right Arm)   Pulse 68   Temp 98 F (36.7 C) (Oral)   Resp 18   SpO2 97%   I consider getting labs to assess his kidney function, we engaged in shared decision-making and patient states that he does not want any blood work or imaging at this time given he has had frequent episodes of this.  I think this is reasonable.  We discussed that given he is having issues with self cath we should proceed with Foley catheter and he should follow-up with urology next week.    Ultimately doubt infectious etiology.  Considered obstructive etiology but there is no hematuria in his urine and I do not really have a high suspicion for nephrolithiasis patient this presentation.    Patient eloped prior to placement of Foley catheter.  Unable to reevaluate.        Final Clinical Impression(s) / ED Diagnoses Final diagnoses:  Urinary  retention    Rx / DC Orders ED Discharge Orders     None         Theron Arista, Cordelia Poche 03/03/22 0908    Glendora Score, MD 03/03/22 1250

## 2022-03-02 NOTE — ED Notes (Signed)
Last time bladder emptied at 0100 at [facility].

## 2022-03-02 NOTE — ED Triage Notes (Signed)
Pt with seen at [facility] last night for same, pt went to self cath at home and not able to advance catheter all the way. Pt states he has enlarged prostate.

## 2022-03-04 DIAGNOSIS — R339 Retention of urine, unspecified: Secondary | ICD-10-CM | POA: Diagnosis not present

## 2022-03-09 ENCOUNTER — Other Ambulatory Visit: Payer: Self-pay

## 2022-03-09 ENCOUNTER — Emergency Department (HOSPITAL_COMMUNITY)
Admission: EM | Admit: 2022-03-09 | Discharge: 2022-03-09 | Disposition: A | Discharge status: Home / Self Care | Payer: PPO | Attending: Emergency Medicine | Admitting: Emergency Medicine

## 2022-03-09 ENCOUNTER — Encounter (HOSPITAL_COMMUNITY): Payer: Self-pay | Admitting: *Deleted

## 2022-03-09 DIAGNOSIS — R339 Retention of urine, unspecified: Secondary | ICD-10-CM | POA: Diagnosis not present

## 2022-03-09 DIAGNOSIS — Z79899 Other long term (current) drug therapy: Secondary | ICD-10-CM | POA: Diagnosis not present

## 2022-03-09 DIAGNOSIS — N3289 Other specified disorders of bladder: Secondary | ICD-10-CM | POA: Insufficient documentation

## 2022-03-09 DIAGNOSIS — R300 Dysuria: Secondary | ICD-10-CM | POA: Diagnosis present

## 2022-03-09 LAB — URINALYSIS, ROUTINE W REFLEX MICROSCOPIC
Bacteria, UA: NONE SEEN
Bilirubin Urine: NEGATIVE
Glucose, UA: NEGATIVE mg/dL
Ketones, ur: NEGATIVE mg/dL
Nitrite: NEGATIVE
Protein, ur: NEGATIVE mg/dL
Specific Gravity, Urine: 1.012 (ref 1.005–1.030)
pH: 6 (ref 5.0–8.0)

## 2022-03-09 LAB — I-STAT CHEM 8, ED
BUN: 10 mg/dL (ref 8–23)
Calcium, Ion: 1.19 mmol/L (ref 1.15–1.40)
Chloride: 106 mmol/L (ref 98–111)
Creatinine, Ser: 0.7 mg/dL (ref 0.61–1.24)
Glucose, Bld: 104 mg/dL — ABNORMAL HIGH (ref 70–99)
HCT: 41 % (ref 39.0–52.0)
Hemoglobin: 13.9 g/dL (ref 13.0–17.0)
Potassium: 3.8 mmol/L (ref 3.5–5.1)
Sodium: 141 mmol/L (ref 135–145)
TCO2: 24 mmol/L (ref 22–32)

## 2022-03-09 NOTE — ED Provider Notes (Addendum)
[facility] DEPT Provider Note   CSN: [id number] Arrival date & time: 03/09/22  0813     History  Chief Complaint  Patient presents with   Dysuria    Craig [name] is a 66 y.o. male.  HPI     66 year old male comes in with chief complaint of difficulty in voiding. Although chief complaint is listed as dysuria, patient does not have any burning with urination.  Patient indicates that he has history of BPH and has to self cath.  Over the last couple of weeks, he has had significant issues with urinary retention.  He is intermittently able to void, but now more so than not he has to in and out cath.  Yesterday started noticing some distention.  When he attempted in and out cath, he was having minimal output.  Last night when he was trying to in and out cath, he was unsuccessful in introducing the catheter into his bladder.  The same issue occurred again this morning, prompting him to come to the ER.  Patient was seen in the ER last week.  At that time he declined indwelling Foley catheter given the risk of infection.  At this time, patient denies any burning with urination, blood in the urine, fevers, chills.  He indicates that typically when he has UTI, he will have burning and increased urinary frequency with fevers and nausea.  He does not think he has UTI.  Home Medications Prior to Admission medications   Medication Sig Start Date End Date Taking? Authorizing Provider  allopurinol (ZYLOPRIM) 100 MG tablet Take 200 mg by mouth daily.    [provider]  ALPRAZolam Duanne Moron) 0.5 MG tablet Take 0.5 mg by mouth 3 (three) times daily.    [provider]  amitriptyline (ELAVIL) 10 MG tablet Take 10 mg by mouth at bedtime.    [provider]  atenolol (TENORMIN) 25 MG tablet Take 1 tablet (25 mg total) by mouth daily. 1st attempt: Please call [phone] to schedule your overdue f/u appt with cardiology to cont refills. 07/13/21  10/11/21  Sherren Mocha, MD  azithromycin (ZITHROMAX) 250 MG tablet Take 1 tablet (250 mg total) by mouth daily. Take first 2 tablets together, then 1 every day until finished. 11/16/21   Eliezer Lofts, FNP  cetirizine (ZYRTEC ALLERGY) 10 MG tablet Take 1 tablet (10 mg total) by mouth daily. 07/02/21   Jaynee Eagles, PA-C  colchicine 0.6 MG tablet Take 0.6 mg by mouth daily. As needed for gout flare.    [provider]  Docusate Calcium (STOOL SOFTENER PO) Take 1-2 tablets by mouth daily as needed (constipation).     [provider]  fexofenadine (ALLEGRA ALLERGY) 180 MG tablet Take 1 tablet (180 mg total) by mouth daily for 15 days. 11/16/21 12/01/21  Eliezer Lofts, FNP  indomethacin (INDOCIN) 25 MG capsule Take 25 mg by mouth daily as needed for mild pain. If needed for gout    [provider]  pseudoephedrine (SUDAFED) 30 MG tablet Take 1 tablet (30 mg total) by mouth every 8 (eight) hours as needed for congestion. 07/02/21   Jaynee Eagles, PA-C  RAPAFLO 8 MG CAPS capsule Take 8 mg by mouth daily. 09/19/16   [provider]      Allergies    Metoprolol, Toprol xl [metoprolol succinate], Other, Sulfamethoxazole-trimethoprim, Wheat, Azithromycin, Ciprofloxacin, Wheat bran, and Zithromax [azithromycin dihydrate]    Review of Systems   Review of Systems  All other systems reviewed  and are negative.   Physical Exam Updated Vital Signs BP 108/75 (BP Location: Right Arm)   Pulse 60   Temp 97.9 F (36.6 C) (Oral)   Resp 18   Ht '5\' 8"'$  (1.727 m)   Wt 110.2 kg   SpO2 95%   BMI 36.95 kg/m  Physical Exam Vitals and nursing note reviewed.  Constitutional:      Appearance: He is well-developed.  HENT:     Head: Atraumatic.  Cardiovascular:     Rate and Rhythm: Normal rate.  Pulmonary:     Effort: Pulmonary effort is normal.  Musculoskeletal:     Cervical back: Neck supple.  Skin:    General: Skin is warm.  Neurological:     Mental Status: He is alert  and oriented to person, place, and time.     ED Results / Procedures / Treatments   Labs (all labs ordered are listed, but only abnormal results are displayed) Labs Reviewed  URINALYSIS, ROUTINE W REFLEX MICROSCOPIC - Abnormal; Notable for the following components:      Result Value   Hgb urine dipstick MODERATE (*)    Leukocytes,Ua TRACE (*)    All other components within normal limits  I-STAT CHEM 8, ED - Abnormal; Notable for the following components:   Glucose, Bld 104 (*)    All other components within normal limits    EKG None  Radiology No results found.  Procedures Procedures    Medications Ordered in ED Medications - No data to display  ED Course/ Medical Decision Making/ A&P Clinical Course as of 03/09/22 1101  Sat Mar 09, 2022  1011 I-stat chem 8, ED (not at [facility] or [facility])(!) No AKI seen in the i-STAT Chem-8.  Urinalysis pending.  Anticipate discharge [AN]  1101 Urinalysis, Routine w reflex microscopic(!) Urine looks clean.  Culture will not be sent. [AN]    Clinical Course User Index [AN] Varney Biles, MD                           Medical Decision Making Amount and/or Complexity of Data Reviewed Labs: ordered. Decision-making details documented in ED Course.   66 year old patient comes in with chief complaint of difficulty in voiding.  He has history of enlarged prostate and urinary retention secondary to it.  It appears that patient has to intermittently self cath.  Over the last 2 weeks, he has had to self cath more frequently than usual.  Differential diagnosis includes AKI secondary to urinary retention and bladder outlet syndrome, worsening of bladder outlet syndrome secondary to prostate and UTI, spasmodic pain.  Other possibilities include strictures.  Clinical suspicion for UTI is low given information provided by the patient.  We ordered bladder scan, patient has less than 200 cc of urine in the bladder. We discussed the option of  continued self cath attempts at home, but patient states that he was unable to fully introduce the catheter in his urethra yesterday and again this morning.  Therefore, the plan is to put in a coud Foley catheter with leg bag.  Patient indicates that he already has a urologist and he will be calling them for an appointment because of his worsening urinary retention.  Strict ER return precautions for infection discussed.  Patient will return to the ER if he starts having severe abdominal pain, back pain, nausea, vomiting, fevers and blood in the urine.   Final Clinical Impression(s) / ED Diagnoses Final diagnoses:  Bladder spasm  Urinary retention    Rx / DC Orders ED Discharge Orders     None         Varney Biles, MD 03/09/22 O'Brien, Medora Roorda, MD 03/09/22 1101

## 2022-03-09 NOTE — ED Triage Notes (Signed)
Pt states he self caths, unable to obtain much urine since yesterday, pt could not advance the cath the last 2 times he tried to cath self, feels distended and uncomfortable. Bladder scan in triage, 134 cc urine

## 2022-03-09 NOTE — Discharge Instructions (Signed)
We saw in the ER for difficulty in voiding.  A new Foley catheter has been placed.  Read the instructions provided to take care of the catheter.  Please call urology service either in [place] or [place], which are is convenient to you for follow-up in 7 to 14 days.  Return to the emergency room if you start having severe bloody output with clots, fevers, chills, nausea, vomiting.

## 2022-03-13 ENCOUNTER — Ambulatory Visit: Payer: PPO | Admitting: Cardiovascular Disease

## 2022-03-13 DIAGNOSIS — Z6837 Body mass index (BMI) 37.0-37.9, adult: Secondary | ICD-10-CM | POA: Diagnosis not present

## 2022-03-13 DIAGNOSIS — M7981 Nontraumatic hematoma of soft tissue: Secondary | ICD-10-CM | POA: Diagnosis not present

## 2022-03-13 DIAGNOSIS — E6609 Other obesity due to excess calories: Secondary | ICD-10-CM | POA: Diagnosis not present

## 2022-03-13 DIAGNOSIS — F418 Other specified anxiety disorders: Secondary | ICD-10-CM | POA: Diagnosis not present

## 2022-03-13 DIAGNOSIS — N401 Enlarged prostate with lower urinary tract symptoms: Secondary | ICD-10-CM | POA: Diagnosis not present

## 2022-03-21 DIAGNOSIS — R3914 Feeling of incomplete bladder emptying: Secondary | ICD-10-CM | POA: Diagnosis not present

## 2022-03-26 DIAGNOSIS — N401 Enlarged prostate with lower urinary tract symptoms: Secondary | ICD-10-CM | POA: Diagnosis not present

## 2022-03-26 DIAGNOSIS — R3914 Feeling of incomplete bladder emptying: Secondary | ICD-10-CM | POA: Diagnosis not present

## 2022-03-27 ENCOUNTER — Ambulatory Visit: Payer: PPO | Admitting: Physician Assistant

## 2022-03-27 ENCOUNTER — Encounter: Payer: Self-pay | Admitting: Physician Assistant

## 2022-03-27 VITALS — BP 102/80 | HR 65 | Ht 68.0 in | Wt 241.6 lb

## 2022-03-27 DIAGNOSIS — Z01810 Encounter for preprocedural cardiovascular examination: Secondary | ICD-10-CM

## 2022-03-27 DIAGNOSIS — R9389 Abnormal findings on diagnostic imaging of other specified body structures: Secondary | ICD-10-CM | POA: Insufficient documentation

## 2022-03-27 DIAGNOSIS — I723 Aneurysm of iliac artery: Secondary | ICD-10-CM

## 2022-03-27 DIAGNOSIS — R002 Palpitations: Secondary | ICD-10-CM

## 2022-03-27 DIAGNOSIS — I700 Atherosclerosis of aorta: Secondary | ICD-10-CM | POA: Diagnosis not present

## 2022-03-27 MED ORDER — ATENOLOL 25 MG PO TABS: 12.5000 mg | ORAL_TABLET | Freq: Every day | ORAL | 3 refills | Status: DC

## 2022-03-27 MED ORDER — ASPIRIN 81 MG PO TBEC: 81.0000 mg | DELAYED_RELEASE_TABLET | Freq: Every day | ORAL | 12 refills | Status: DC

## 2022-03-27 MED ORDER — ROSUVASTATIN CALCIUM 10 MG PO TABS: 10.0000 mg | ORAL_TABLET | Freq: Every day | ORAL | 3 refills | Status: DC

## 2022-03-27 NOTE — Assessment & Plan Note (Signed)
Symptoms of PVCs overall quiescent on current dose of beta-blocker.  Continue atenolol 12.5 mg daily.  He does have some brief, rapid palpitations that only last seconds.  These occur rarely.  We discussed the potential for obtaining a 14-day ZIO monitor.  However, given the rare occurrence, the yield would likely be low.  We did discuss the possibility of obtaining a device such as an Visual merchandiser or [employer]ardia mobile device.  He knows to contact me if his palpitations worsen.  We can certainly obtain a monitor at that point.

## 2022-03-27 NOTE — Patient Instructions (Addendum)
Medication Instructions:  Your physician has recommended you make the following change in your medication:   START Aspirin 81 mg daily... If you have surgery, it is ok to hold this for 7 days prior to surgery  START Rosuvastatin 10 mg taking 1 daily   *If you need a refill on your cardiac medications before your next appointment, please call your pharmacy*   Lab Work: 3 MONTHS:  FASTING LIPID & CMET  If you have labs (blood work) drawn today and your tests are completely normal, you will receive your results only by: [place] (if you have MyChart) OR A paper copy in the mail If you have any lab test that is abnormal or we need to change your treatment, we will call you to review the results.   Testing/Procedures: None ordered   Follow-Up: At [facility], you and your health needs are our priority.  As part of our continuing mission to provide you with exceptional heart care, we have created designated Provider Care Teams.  These Care Teams include your primary Cardiologist (physician) and Advanced Practice Providers (APPs -  Physician Assistants and Nurse Practitioners) who all work together to provide you with the care you need, when you need it.  We recommend signing up for the patient portal called "MyChart".  Sign up information is provided on this After Visit Summary.  MyChart is used to connect with patients for Virtual Visits (Telemedicine).  Patients are able to view lab/test results, encounter notes, upcoming appointments, etc.  Non-urgent messages can be sent to your provider as well.   To learn more about what you can do with MyChart, go to [email or web address].    Your next appointment:   1 year(s)  The format for your next appointment:   In Person  Provider:   Sherren Mocha, MD  or Richardson Dopp, PA-C         Other Instructions If your palpitations increase, call us and we can order a monitor  Important Information About Sugar

## 2022-03-27 NOTE — Assessment & Plan Note (Addendum)
Mr. Menter may require surgery soon for his prostate. His perioperative risk of a major cardiac event is 0.4% according to the Revised Cardiac Risk Index (RCRI).  Therefore, he is at low risk for perioperative complications.   His functional capacity is good at 4.31 METs according to the Duke Activity Status Index (DASI). Recommendations: According to ACC/AHA guidelines, no further cardiovascular testing needed.  The patient may proceed to surgery at acceptable risk.   Antiplatelet and/or Anticoagulation Recommendations: Aspirin can be held for 7 days prior to his surgery.  Please resume Aspirin post operatively when it is felt to be safe from a bleeding standpoint.

## 2022-03-27 NOTE — Assessment & Plan Note (Signed)
This has been ruled out.  It was suspected on prior CT without contrast.  Abdominal pelvic CT with contrast in August 2022 demonstrated no aneurysm.

## 2022-03-27 NOTE — Assessment & Plan Note (Signed)
Incidental finding on recent CT.  We discussed the importance of aspirin and statin therapy.  End 81 mg daily.  Start rosuvastatin 10 mg daily.  Obtain CMET, lipids in 3 months.

## 2022-03-27 NOTE — Progress Notes (Addendum)
Cardiology Office Note:    Date:  03/27/2022   ID:  Craig [name], DOB 1956/04/10, MRN [id number]  PCP:  Craig Squibb, MD  [facility] [place] [facility] Cardiologist:  Craig Mocha, MD Cardiology APP:  Craig [name]    Referring MD: Craig Squibb, MD   Chief Complaint:  F/u for palpitations    Patient Profile: Palpitations Symptomatic PVCs Hx of syncope (2008) Hx of ventricular bigeminy  Intol of bisoprolol due to bradycardia  Aortic atherosclerosis Hypertension  Hyperlipidemia  Sleep apnea  ??Left common iliac artery aneurysm>>r/o on CT in 8/22 CT 12/28/2017: Distal left CIA 3 cm, proximal left CIA 2.3 cm, normal aorta Abd/Pelvic CTA 04/2021 ([facility]): no aneurysm noted  BPH - urinary retention    Prior CV Studies: CT ABD/PELVIS W AND WO CONTRAST 04/17/21 ([facility]) IMPRESSION:  1. No acute findings identified within the abdomen or pelvis. No  evidence for prostate gland abscess.  2. Prostate gland enlargement.  3. Aortic atherosclerosis.  4. Bilateral femoral head avascular necrosis.   Aortic Atherosclerosis (ICD10-I70.0).   Holter 09/16/17 Sinus rhythm with periods of sinus bradycardia lowest HR 51 bpm Few PVC's (<1%), PAC's, and short atrial runs No significant bradycardic events, pathologic pauses > 3 seconds, or sustained arrhythmias Benign Holter   Echo 6/12 EF 60%, mild LVH, normal diastolic function   ETT-Echo 5/12 Normal  History of Present Illness:   Craig [name] is a 66 y.o. male with the above problem list.  He was last seen in Nov 2021. He has been seen in the past for palpitations. His last visit was via Telemedicine. He had a non-contrast CT in the past with question of L CIA aneurysm. A CTA was to be arranged her to confirm this but was never done. He did have a CTA at [facility] in Aug 2022. There was no aneurysm noted.  He returns for follow-up.  He has been following with urology (Dr. Louis [name]) for BPH.  He has significant issues with  urinary retention and has had to have a Foley catheter in the past.  He has also been on several rounds of antibiotics.  He may need surgery soon.  A CT scan done for work-up of his BPH did rule out common iliac artery aneurysm as was previously noted on a noncontrast CT.  The CT scan did show aortic atherosclerosis.  He is also concerned about bilateral femoral head avascular necrosis that was noted.  He does have some hip pain and will likely see an orthopedist soon.  He has not yet been scheduled for surgery but may anticipate prostate surgery in the near future.  He has not had chest pain, shortness of breath, syncope, orthopnea, leg edema.  He does have some rare palpitations but his PVC symptoms are quiescent on his current dose of atenolol.       Past Medical History:  Diagnosis Date   Anxiety    Asthma    Chest pain    Nuclear, 2006, normal, / stress echo, June, 2012, normal, EF 60%   Diabetes mellitus    ?Pt notes he does not have; not clear how this ended up in chart   Diverticular disease    Ejection fraction    EF 60%, echo, June, 2012   Fatigue    Chronic   Gout    Hyperlipemia    Hypertension    Overweight(278.02)    Palpitations    30 day event recorder, 2006, negative  Sleep apnea    Dr. Halford Chessman   Tachycardia    January, 2013   Tobacco abuse    former   Urinary retention    Ventricular bigeminy    Current Medications: Current Meds  Medication Sig   allopurinol (ZYLOPRIM) 100 MG tablet Take 100 mg by mouth daily.   ALPRAZolam (XANAX) 0.5 MG tablet Take 0.5 mg by mouth 4 (four) times daily.   amitriptyline (ELAVIL) 10 MG tablet Take 10 mg by mouth at bedtime.   aspirin EC 81 MG tablet Take 1 tablet (81 mg total) by mouth daily. Swallow whole.   azithromycin (ZITHROMAX) 250 MG tablet Take 1 tablet (250 mg total) by mouth daily. Take first 2 tablets together, then 1 every day until finished.   colchicine 0.6 MG tablet Take 0.6 mg by mouth daily. As needed for gout  flare.   Docusate Calcium (STOOL SOFTENER PO) Take 1-2 tablets by mouth daily as needed (constipation).    doxycycline (VIBRAMYCIN) 100 MG capsule Take 100 mg by mouth 2 (two) times daily.   indomethacin (INDOCIN) 25 MG capsule Take 25 mg by mouth daily as needed for mild pain. If needed for gout   RAPAFLO 8 MG CAPS capsule Take 8 mg by mouth daily.   rosuvastatin (CRESTOR) 10 MG tablet Take 1 tablet (10 mg total) by mouth daily.   [DISCONTINUED] atenolol (TENORMIN) 25 MG tablet Take 12.5 mg by mouth daily.    Allergies:   Metoprolol, Toprol xl [metoprolol succinate], Other, Sulfamethoxazole-trimethoprim, Wheat, Azithromycin, Ciprofloxacin, Wheat bran, and Zithromax [azithromycin dihydrate]   Social History   Tobacco Use   Smoking status: Former   Smokeless tobacco: Never   Tobacco comments:    Only smoked x 1 year in high school.  Vaping Use   Vaping Use: Never used  Substance Use Topics   Alcohol use: No    Alcohol/week: 0.0 standard drinks of alcohol   Drug use: No    Family Hx: The patient's family history includes Diverticulosis in his maternal grandfather and sister; Hypertension in his mother.  Review of Systems  Genitourinary:  Positive for incomplete emptying.     EKGs/Labs/Other Test Reviewed:    EKG:  EKG is ordered today.  The ekg ordered today demonstrates NSR, HR 65, normal axis, no ST-T wave changes, QTc 418  Recent Labs: 03/09/2022: BUN 10; Creatinine, Ser 0.70; Hemoglobin 13.9; Potassium 3.8; Sodium 141   Recent Lipid Panel No results for input(s): "CHOL", "TRIG", "HDL", "VLDL", "LDLCALC", "LDLDIRECT" in the last 8760 hours.   Risk Assessment/Calculations/Metrics:              Physical Exam:    VS:  BP 102/80   Pulse 65   Ht '5\' 8"'  (1.727 m)   Wt 241 lb 9.6 oz (109.6 kg)   SpO2 96%   BMI 36.74 kg/m     Wt Readings from Last 3 Encounters:  03/27/22 241 lb 9.6 oz (109.6 kg)  03/09/22 243 lb (110.2 kg)  03/02/22 243 lb (110.2 kg)     Constitutional:      Appearance: Healthy appearance. Not in distress.  Neck:     Vascular: JVD normal.  Pulmonary:     Effort: Pulmonary effort is normal.     Breath sounds: No wheezing. No rales.  Cardiovascular:     Normal rate. Regular rhythm. Normal S1. Normal S2.      Murmurs: There is no murmur.  Edema:    Peripheral edema absent.  Abdominal:  Palpations: Abdomen is soft.  Skin:    General: Skin is warm and dry.  Neurological:     General: No focal deficit present.     Mental Status: Alert and oriented to person, place and time.          ASSESSMENT & PLAN:   Palpitations Symptoms of PVCs overall quiescent on current dose of beta-blocker.  Continue atenolol 12.5 mg daily.  He does have some brief, rapid palpitations that only last seconds.  These occur rarely.  We discussed the potential for obtaining a 14-day ZIO monitor.  However, given the rare occurrence, the yield would likely be low.  We did discuss the possibility of obtaining a device such as an Visual merchandiser or Kardia mobile device.  He knows to contact me if his palpitations worsen.  We can certainly obtain a monitor at that point.  Aneurysm of left common iliac artery (HCC) This has been ruled out.  It was suspected on prior CT without contrast.  Abdominal pelvic CT with contrast in August 2022 demonstrated no aneurysm.  Preoperative cardiovascular examination Mr. Troiano may require surgery soon for his prostate. His perioperative risk of a major cardiac event is 0.4% according to the Revised Cardiac Risk Index (RCRI).  Therefore, he is at low risk for perioperative complications.   His functional capacity is good at 4.31 METs according to the Duke Activity Status Index (DASI). Recommendations: According to ACC/AHA guidelines, no further cardiovascular testing needed.  The patient may proceed to surgery at acceptable risk.   Antiplatelet and/or Anticoagulation Recommendations: Aspirin can be held for 7 days prior  to his surgery.  Please resume Aspirin post operatively when it is felt to be safe from a bleeding standpoint.   Aortic atherosclerosis ([place]) Incidental finding on recent CT.  We discussed the importance of aspirin and statin therapy.  End 81 mg daily.  Start rosuvastatin 10 mg daily.  Obtain CMET, lipids in 3 months.             Dispo:  Return in about 1 year (around 03/28/2023) for Routine Follow Up, w/ Craig [name], or Richardson Dopp, PA-C.   Medication Adjustments/Labs and Tests Ordered: Current medicines are reviewed at length with the patient today.  Concerns regarding medicines are outlined above.  Tests Ordered: Orders Placed This Encounter  Procedures   Comp Met (CMET)   Lipid panel   EKG 12-Lead   Medication Changes: Meds ordered this encounter  Medications   aspirin EC 81 MG tablet    Sig: Take 1 tablet (81 mg total) by mouth daily. Swallow whole.    Dispense:  30 tablet    Refill:  12   rosuvastatin (CRESTOR) 10 MG tablet    Sig: Take 1 tablet (10 mg total) by mouth daily.    Dispense:  90 tablet    Refill:  3   atenolol (TENORMIN) 25 MG tablet    Sig: Take 0.5 tablets (12.5 mg total) by mouth daily.    Dispense:  45 tablet    Refill:  3   Signed, Richardson Dopp, PA-C  03/27/2022 5:12 PM    [address], [place], [place]  [address] Phone: [phone]; Fax: [phone]

## 2022-03-31 ENCOUNTER — Other Ambulatory Visit: Payer: Self-pay

## 2022-03-31 ENCOUNTER — Encounter (HOSPITAL_COMMUNITY): Payer: Self-pay

## 2022-03-31 ENCOUNTER — Emergency Department (HOSPITAL_COMMUNITY)
Admission: EM | Admit: 2022-03-31 | Discharge: 2022-03-31 | Disposition: A | Discharge status: Home / Self Care | Payer: PPO | Attending: Emergency Medicine | Admitting: Emergency Medicine

## 2022-03-31 DIAGNOSIS — R109 Unspecified abdominal pain: Secondary | ICD-10-CM | POA: Insufficient documentation

## 2022-03-31 DIAGNOSIS — Z7982 Long term (current) use of aspirin: Secondary | ICD-10-CM | POA: Insufficient documentation

## 2022-03-31 DIAGNOSIS — R339 Retention of urine, unspecified: Secondary | ICD-10-CM | POA: Insufficient documentation

## 2022-03-31 LAB — URINALYSIS, ROUTINE W REFLEX MICROSCOPIC
Bacteria, UA: NONE SEEN
Bilirubin Urine: NEGATIVE
Glucose, UA: NEGATIVE mg/dL
Ketones, ur: NEGATIVE mg/dL
Leukocytes,Ua: NEGATIVE
Nitrite: NEGATIVE
Protein, ur: NEGATIVE mg/dL
Specific Gravity, Urine: 1.012 (ref 1.005–1.030)
pH: 6 (ref 5.0–8.0)

## 2022-03-31 NOTE — ED Provider Notes (Signed)
[facility] DEPT Provider Note   CSN: [id number] Arrival date & time: 03/31/22  1111     History  Chief Complaint  Patient presents with   Urinary Retention    Craig [name] is a 66 y.o. male.  HPI     Craig [name] , a 66 y.o. male  was evaluated in triage.  Pt complains of urinary retention, incomplete emptying. Feels distended with abdominal pain. Did self cath and have some flow this morning. 223 mL on bladder scan in triage.  Patient states that he can typically self cath despite having prostatic hypertrophy however he does not feel he empties completely with that maneuver.  He has done that twice within the last 20 hours.  He still has a sensation of discomfort in the mid to left lower pelvic region.  He denies fever, vomiting, dysuria or weakness.  Home Medications Prior to Admission medications   Medication Sig Start Date End Date Taking? Authorizing Provider  allopurinol (ZYLOPRIM) 100 MG tablet Take 100 mg by mouth daily.    [provider]  ALPRAZolam Duanne Moron) 0.5 MG tablet Take 0.5 mg by mouth 4 (four) times daily.    [provider]  amitriptyline (ELAVIL) 10 MG tablet Take 10 mg by mouth at bedtime.    [provider]  aspirin EC 81 MG tablet Take 1 tablet (81 mg total) by mouth daily. Swallow whole. 03/27/22   Richardson Dopp T, PA-C  atenolol (TENORMIN) 25 MG tablet Take 0.5 tablets (12.5 mg total) by mouth daily. 03/27/22   Richardson Dopp T, PA-C  azithromycin (ZITHROMAX) 250 MG tablet Take 1 tablet (250 mg total) by mouth daily. Take first 2 tablets together, then 1 every day until finished. 11/16/21   Eliezer Lofts, FNP  colchicine 0.6 MG tablet Take 0.6 mg by mouth daily. As needed for gout flare.    [provider]  Docusate Calcium (STOOL SOFTENER PO) Take 1-2 tablets by mouth daily as needed (constipation).     [provider]  doxycycline (VIBRAMYCIN) 100 MG capsule Take 100 mg by mouth 2  (two) times daily. 02/15/22   [provider]  indomethacin (INDOCIN) 25 MG capsule Take 25 mg by mouth daily as needed for mild pain. If needed for gout    [provider]  RAPAFLO 8 MG CAPS capsule Take 8 mg by mouth daily. 09/19/16   [provider]  rosuvastatin (CRESTOR) 10 MG tablet Take 1 tablet (10 mg total) by mouth daily. 03/27/22   Richardson Dopp T, PA-C      Allergies    Metoprolol, Toprol xl [metoprolol succinate], Other, Sulfamethoxazole-trimethoprim, Wheat, Azithromycin, Ciprofloxacin, Wheat bran, and Zithromax [azithromycin dihydrate]    Review of Systems   Review of Systems  Physical Exam Updated Vital Signs BP 112/71   Pulse 60   Temp 97.8 F (36.6 C) (Oral)   Resp 16   Ht '5\' 8"'$  (1.727 m)   Wt 109.3 kg   SpO2 98%   BMI 36.64 kg/m  Physical Exam Vitals and nursing note reviewed.  Constitutional:      General: He is not in acute distress.    Appearance: He is well-developed. He is not ill-appearing or diaphoretic.  HENT:     Head: Normocephalic and atraumatic.     Right Ear: External ear normal.     Left Ear: External ear normal.     Mouth/Throat:     Mouth: Mucous membranes are moist.  Eyes:  Conjunctiva/sclera: Conjunctivae normal.     Pupils: Pupils are equal, round, and reactive to light.  Neck:     Trachea: Phonation normal.  Cardiovascular:     Rate and Rhythm: Normal rate.  Pulmonary:     Effort: Pulmonary effort is normal.  Abdominal:     General: There is no distension.     Tenderness: There is abdominal tenderness (Suprapubic, mild.  No palpable mass.).  Musculoskeletal:        General: Normal range of motion.     Cervical back: Normal range of motion and neck supple.  Skin:    General: Skin is warm and dry.  Neurological:     Mental Status: He is alert and oriented to person, place, and time.     Cranial Nerves: No cranial nerve deficit.     Sensory: No sensory deficit.     Motor: No abnormal muscle tone.      Coordination: Coordination normal.  Psychiatric:        Mood and Affect: Mood normal.        Behavior: Behavior normal.        Thought Content: Thought content normal.        Judgment: Judgment normal.     ED Results / Procedures / Treatments   Labs (all labs ordered are listed, but only abnormal results are displayed) Labs Reviewed  URINALYSIS, ROUTINE W REFLEX MICROSCOPIC - Abnormal; Notable for the following components:      Result Value   Hgb urine dipstick SMALL (*)    All other components within normal limits    EKG None  Radiology No results found.  Procedures Procedures    Medications Ordered in ED Medications - No data to display  ED Course/ Medical Decision Making/ A&P                           Medical Decision Making Patient presenting with sensation of urinary retention, despite self cathing at home.  In the ED his urine bladder scan showed 223 cc of urine.  Patient requested in and out catheter.  Amount and/or Complexity of Data Reviewed Independent Historian:     Details: He is a cogent historian Labs: ordered.    Details: Urinalysis-normal ECG/medicine tests: ordered and independent interpretation performed.    Details: Urinary bladder scanning-evidence for urinary retention, with 223 cc urine in the urinary bladder.  Risk Decision regarding hospitalization. Risk Details: Patient presenting with recurrent urinary tract symptoms, in light of chronic and intermittent urinary retention requiring intermittent self urinary catheterization.  His symptoms come and go and more recently have been intense, with a grabbing or squeezing sensation.  He does not describe dysuria or hematuria.  He still feels full after self catheterizations.  He follows closely with his PCP.  He was in the ED about 4 weeks ago requiring urinary catheterization.  There has been no fever.  Doubt UTI, bladder outlet obstruction or intra-abdominal infection, or viscus perforation.   Discussion about treatment with the patient, he preferred to have less try urinary catheterization to empty his bladder.  In and out catheter was placed by staff, I observed it and help the patient to try to express more urine by gently pressing on his suprapubic region and willing him to each side to increase urinary output.  Patient was comfortable during this maneuver and reports decrease in discomfort after catheterization.  I suspect that he is having bladder spasm causing his  discomfort.  He does not appear to have urinary tract infection or bladder outlet obstruction.  He does not require hospitalization at this time.  I have encouraged him to continue self cathing as needed and call his urologist for follow-up appointment.           Final Clinical Impression(s) / ED Diagnoses Final diagnoses:  Urinary retention    Rx / DC Orders ED Discharge Orders     None         Daleen Bo, MD 03/31/22 [phone]

## 2022-03-31 NOTE — ED Provider Triage Note (Signed)
Emergency Medicine Provider Triage Evaluation Note  CHIDERA THIVIERGE , a 66 y.o. male  was evaluated in triage.  Pt complains of urinary retention, incomplete emptying. Feels distended with abdominal pain. Did self cath and have some flow this morning. 223 mL on bladder scan in triage.  Review of Systems  Positive: Urinary retention Negative: Fever, chills  Physical Exam  BP (!) 158/95 (BP Location: Left Arm)   Pulse 90   Temp (!) 97.5 F (36.4 C) (Oral)   Resp 18   Ht '5\' 8"'$  (1.727 m)   Wt 109.3 kg   SpO2 95%   BMI 36.64 kg/m  Gen:   Awake, no distress   Resp:  Normal effort  MSK:   Moves extremities without difficulty  Other:    Medical Decision Making  Medically screening exam initiated at 12:15 PM.  Appropriate orders placed.  LAURIN MORGENSTERN was informed that the remainder of the evaluation will be completed by another provider, this initial triage assessment does not replace that evaluation, and the importance of remaining in the ED until their evaluation is complete.  Workup initiated   Anselmo Pickler, [place] 03/31/22 1216

## 2022-03-31 NOTE — ED Triage Notes (Addendum)
Patient states he self caths himself and the last time was at 0730 today. Patient states he still feels distended and sore.  Bladder scan in triage-223 ml.

## 2022-03-31 NOTE — Discharge Instructions (Signed)
It is likely you are having bladder spasm causing most of your symptoms.  Continue to do self catheterizations when you are unable to spontaneously void.  Call your urologist to see about getting started on a bladder spasm medication.  Return here, if needed.

## 2022-04-03 DIAGNOSIS — R339 Retention of urine, unspecified: Secondary | ICD-10-CM | POA: Diagnosis not present

## 2022-04-11 DIAGNOSIS — L0291 Cutaneous abscess, unspecified: Secondary | ICD-10-CM | POA: Diagnosis not present

## 2022-04-17 DIAGNOSIS — L0291 Cutaneous abscess, unspecified: Secondary | ICD-10-CM | POA: Diagnosis not present

## 2022-04-23 DIAGNOSIS — L0293 Carbuncle, unspecified: Secondary | ICD-10-CM | POA: Diagnosis not present

## 2022-04-27 ENCOUNTER — Other Ambulatory Visit: Payer: Self-pay | Admitting: Urology

## 2022-04-27 MED ORDER — SULFAMETHOXAZOLE-TRIMETHOPRIM 800-160 MG PO TABS: 1.0000 | ORAL_TABLET | Freq: Two times a day (BID) | ORAL | 0 refills | Status: DC

## 2022-04-30 DIAGNOSIS — N401 Enlarged prostate with lower urinary tract symptoms: Secondary | ICD-10-CM | POA: Diagnosis not present

## 2022-04-30 DIAGNOSIS — N3000 Acute cystitis without hematuria: Secondary | ICD-10-CM | POA: Diagnosis not present

## 2022-04-30 DIAGNOSIS — R3914 Feeling of incomplete bladder emptying: Secondary | ICD-10-CM | POA: Diagnosis not present

## 2022-05-06 DIAGNOSIS — R3914 Feeling of incomplete bladder emptying: Secondary | ICD-10-CM | POA: Diagnosis not present

## 2022-05-06 DIAGNOSIS — N401 Enlarged prostate with lower urinary tract symptoms: Secondary | ICD-10-CM | POA: Diagnosis not present

## 2022-05-09 DIAGNOSIS — R339 Retention of urine, unspecified: Secondary | ICD-10-CM | POA: Diagnosis not present

## 2022-05-10 DIAGNOSIS — R3914 Feeling of incomplete bladder emptying: Secondary | ICD-10-CM | POA: Diagnosis not present

## 2022-05-10 DIAGNOSIS — N401 Enlarged prostate with lower urinary tract symptoms: Secondary | ICD-10-CM | POA: Diagnosis not present

## 2022-05-16 DIAGNOSIS — N401 Enlarged prostate with lower urinary tract symptoms: Secondary | ICD-10-CM | POA: Diagnosis not present

## 2022-05-16 DIAGNOSIS — R3914 Feeling of incomplete bladder emptying: Secondary | ICD-10-CM | POA: Diagnosis not present

## 2022-05-27 ENCOUNTER — Telehealth: Payer: Self-pay | Admitting: Cardiology

## 2022-05-27 DIAGNOSIS — N3000 Acute cystitis without hematuria: Secondary | ICD-10-CM | POA: Diagnosis not present

## 2022-05-27 NOTE — Telephone Encounter (Signed)
Patient called stating that he was seen by his Urologist today and was given a shot of Rocephin.  He then started getting palpitations and HR was elevated at 100bpm while sitting. He took a shower and then took his pulse and it was 96 to 110bpm.  He has a hx of PVCs.  He has been on Atenolol 12.'5mg'$  daily.  Currently his BP is 131/21mHg.  Instructed him to take an additional 12.'5mg'$  of Atenolol and if not better by the am then call the office.

## 2022-05-29 DIAGNOSIS — R338 Other retention of urine: Secondary | ICD-10-CM | POA: Diagnosis not present

## 2022-06-17 DIAGNOSIS — R3914 Feeling of incomplete bladder emptying: Secondary | ICD-10-CM | POA: Diagnosis not present

## 2022-06-26 ENCOUNTER — Other Ambulatory Visit: Payer: PPO

## 2022-06-28 DIAGNOSIS — L304 Erythema intertrigo: Secondary | ICD-10-CM | POA: Diagnosis not present

## 2022-06-28 DIAGNOSIS — N401 Enlarged prostate with lower urinary tract symptoms: Secondary | ICD-10-CM | POA: Diagnosis not present

## 2022-06-28 DIAGNOSIS — I10 Essential (primary) hypertension: Secondary | ICD-10-CM | POA: Diagnosis not present

## 2022-06-28 DIAGNOSIS — F5104 Psychophysiologic insomnia: Secondary | ICD-10-CM | POA: Diagnosis not present

## 2022-06-28 DIAGNOSIS — K649 Unspecified hemorrhoids: Secondary | ICD-10-CM | POA: Diagnosis not present

## 2022-07-11 ENCOUNTER — Other Ambulatory Visit: Payer: Self-pay | Admitting: Urology

## 2022-07-22 DIAGNOSIS — R3914 Feeling of incomplete bladder emptying: Secondary | ICD-10-CM | POA: Diagnosis not present

## 2022-07-22 DIAGNOSIS — N401 Enlarged prostate with lower urinary tract symptoms: Secondary | ICD-10-CM | POA: Diagnosis not present

## 2022-07-22 DIAGNOSIS — N320 Bladder-neck obstruction: Secondary | ICD-10-CM | POA: Diagnosis not present

## 2022-07-25 ENCOUNTER — Encounter (HOSPITAL_BASED_OUTPATIENT_CLINIC_OR_DEPARTMENT_OTHER): Payer: Self-pay | Admitting: Urology

## 2022-07-25 NOTE — Progress Notes (Signed)
Spoke w/ via phone for pre-op interview--- pt Lab needs dos---- cbc, bmp              Lab results------ current EKG in epic/ chart COVID test -----patient states asymptomatic no test needed Arrive at ------- 0915 on 08-07-2022 NPO after MN NO Solid Food.  Clear liquids from MN until---  0815 Med rec completed Medications to take morning of surgery ----- atenolol, allopurinol, xanax Diabetic medication ----- n/a Patient instructed no nail polish to be worn day of surgery Patient instructed to bring photo id and insurance card day of surgery Patient aware to have Driver (ride ) / caregiver for 24 hours after surgery --- wife, deborah Patient Special Instructions ----- reviewed RCC and visitor guidelines  Pre-Op special Istructions ----- pt has cardiology office note clearance by Richardson Dopp PA on 03-27-2022 in epic/ chart Patient verbalized understanding of instructions that were given at this phone interview. Patient denies shortness of breath, chest pain, fever, cough at this phone interview.   Anesthesia Review: HTN;  PVCs;  hx syncope 2008 unknown cause (pt stated no syncope/ near syncope since;  OSA never used cpap Pt denies cardiac s&s, sob, and no peripheral  PCP: Dr Lenetta Quaker Cardiologist :  Dr Juliann Pulse PA (lov 03-27-2022 epic) Chest x-ray :  11-22-2021 EKG : 03-27-2022 Echo : 02-28-2011 Stress test:  stress echo 01-30-2011 Event monitor:  09-16-2017 Cardiac Cath :  no Activity level:  denies sob w/ any activity Sleep Study/ CPAP :  Yes/ no Blood Thinner/ Instructions /Last Dose:  no ASA / Instructions/ Last Dose :  per pt last taken asa 07/ 2023 his choice, aware what he has been told by cardiology

## 2022-07-31 DIAGNOSIS — R338 Other retention of urine: Secondary | ICD-10-CM | POA: Diagnosis not present

## 2022-07-31 DIAGNOSIS — R3914 Feeling of incomplete bladder emptying: Secondary | ICD-10-CM | POA: Diagnosis not present

## 2022-07-31 DIAGNOSIS — N401 Enlarged prostate with lower urinary tract symptoms: Secondary | ICD-10-CM | POA: Diagnosis not present

## 2022-08-07 ENCOUNTER — Other Ambulatory Visit: Payer: Self-pay

## 2022-08-07 ENCOUNTER — Ambulatory Visit (HOSPITAL_BASED_OUTPATIENT_CLINIC_OR_DEPARTMENT_OTHER): Payer: PPO | Admitting: Anesthesiology

## 2022-08-07 ENCOUNTER — Observation Stay (HOSPITAL_BASED_OUTPATIENT_CLINIC_OR_DEPARTMENT_OTHER)
Admission: RE | Admit: 2022-08-07 | Discharge: 2022-08-08 | Disposition: A | Discharge status: Home / Self Care | Payer: PPO | Attending: Urology | Admitting: Urology

## 2022-08-07 ENCOUNTER — Encounter (HOSPITAL_BASED_OUTPATIENT_CLINIC_OR_DEPARTMENT_OTHER)
Admission: RE | Disposition: A | Discharge status: Home / Self Care | Payer: Self-pay | Source: Home / Self Care | Attending: Urology

## 2022-08-07 ENCOUNTER — Encounter (HOSPITAL_BASED_OUTPATIENT_CLINIC_OR_DEPARTMENT_OTHER): Payer: Self-pay | Admitting: Urology

## 2022-08-07 DIAGNOSIS — N401 Enlarged prostate with lower urinary tract symptoms: Principal | ICD-10-CM | POA: Insufficient documentation

## 2022-08-07 DIAGNOSIS — I10 Essential (primary) hypertension: Secondary | ICD-10-CM

## 2022-08-07 DIAGNOSIS — R338 Other retention of urine: Secondary | ICD-10-CM | POA: Diagnosis not present

## 2022-08-07 DIAGNOSIS — Z79899 Other long term (current) drug therapy: Secondary | ICD-10-CM | POA: Diagnosis not present

## 2022-08-07 DIAGNOSIS — E119 Type 2 diabetes mellitus without complications: Secondary | ICD-10-CM | POA: Insufficient documentation

## 2022-08-07 DIAGNOSIS — J45909 Unspecified asthma, uncomplicated: Secondary | ICD-10-CM | POA: Insufficient documentation

## 2022-08-07 DIAGNOSIS — R339 Retention of urine, unspecified: Secondary | ICD-10-CM

## 2022-08-07 DIAGNOSIS — Z87891 Personal history of nicotine dependence: Secondary | ICD-10-CM

## 2022-08-07 DIAGNOSIS — Z01818 Encounter for other preprocedural examination: Secondary | ICD-10-CM

## 2022-08-07 DIAGNOSIS — N320 Bladder-neck obstruction: Secondary | ICD-10-CM | POA: Diagnosis not present

## 2022-08-07 HISTORY — DX: Presence of other specified devices: Z97.8

## 2022-08-07 HISTORY — DX: Other vitreous opacities, bilateral: H43.393

## 2022-08-07 HISTORY — DX: Generalized anxiety disorder: F41.1

## 2022-08-07 HISTORY — DX: Other obstructive and reflux uropathy: N13.8

## 2022-08-07 HISTORY — DX: Prediabetes: R73.03

## 2022-08-07 HISTORY — DX: Obstructive sleep apnea (adult) (pediatric): G47.33

## 2022-08-07 HISTORY — DX: Diverticulosis of large intestine without perforation or abscess without bleeding: K57.30

## 2022-08-07 HISTORY — DX: Presence of spectacles and contact lenses: Z97.3

## 2022-08-07 HISTORY — DX: Ventricular premature depolarization: I49.3

## 2022-08-07 HISTORY — DX: Benign prostatic hyperplasia with lower urinary tract symptoms: N40.1

## 2022-08-07 HISTORY — DX: Personal history of other diseases of male genital organs: Z87.438

## 2022-08-07 HISTORY — PX: TRANSURETHRAL RESECTION OF PROSTATE: SHX73

## 2022-08-07 HISTORY — DX: Idiopathic chronic gout, unspecified site, without tophus (tophi): M1A.00X0

## 2022-08-07 HISTORY — DX: Spondylosis, unspecified: M47.9

## 2022-08-07 LAB — BASIC METABOLIC PANEL
Anion gap: 9 (ref 5–15)
BUN: 15 mg/dL (ref 8–23)
CO2: 24 mmol/L (ref 22–32)
Calcium: 9.2 mg/dL (ref 8.9–10.3)
Chloride: 106 mmol/L (ref 98–111)
Creatinine, Ser: 0.81 mg/dL (ref 0.61–1.24)
GFR, Estimated: >60 mL/min (ref >60)
Glucose, Bld: 99 mg/dL (ref 70–99)
Potassium: 4 mmol/L (ref 3.5–5.1)
Sodium: 139 mmol/L (ref 135–145)

## 2022-08-07 LAB — CBC
HCT: 44.3 % (ref 39.0–52.0)
Hemoglobin: 16 g/dL (ref 13.0–17.0)
MCH: 33.2 pg (ref 26.0–34.0)
MCHC: 36.1 g/dL — ABNORMAL HIGH (ref 30.0–36.0)
MCV: 91.9 fL (ref 80.0–100.0)
Platelets: 249 10*3/uL (ref 150–400)
RBC: 4.82 MIL/uL (ref 4.22–5.81)
RDW: 12.2 % (ref 11.5–15.5)
WBC: 6.6 10*3/uL (ref 4.0–10.5)
nRBC: 0 % (ref 0.0–0.2)

## 2022-08-07 SURGERY — TURP (TRANSURETHRAL RESECTION OF PROSTATE)
Anesthesia: General | Site: Prostate

## 2022-08-07 MED ORDER — ZOLPIDEM TARTRATE 5 MG PO TABS: 5.0000 mg | ORAL_TABLET | Freq: Every evening | ORAL | Status: DC | PRN

## 2022-08-07 MED ORDER — EPHEDRINE 5 MG/ML INJ
INTRAVENOUS | Status: AC
  Filled 2022-08-07: qty 5

## 2022-08-07 MED ORDER — 0.9 % SODIUM CHLORIDE (POUR BTL) OPTIME
TOPICAL | Status: DC | PRN
  Administered 2022-08-07: 1000 mL

## 2022-08-07 MED ORDER — LORAZEPAM 2 MG/ML IJ SOLN
1.0000 mg | INTRAMUSCULAR | Status: DC
  Administered 2022-08-07: 1 mg via INTRAVENOUS

## 2022-08-07 MED ORDER — FENTANYL CITRATE (PF) 100 MCG/2ML IJ SOLN
INTRAMUSCULAR | Status: AC
  Filled 2022-08-07: qty 2

## 2022-08-07 MED ORDER — ONDANSETRON HCL 4 MG/2ML IJ SOLN
INTRAMUSCULAR | Status: AC
  Filled 2022-08-07: qty 2

## 2022-08-07 MED ORDER — LORAZEPAM 2 MG/ML IJ SOLN
1.0000 mg | Freq: Once | INTRAMUSCULAR | Status: AC
  Administered 2022-08-07: 1 mg via INTRAVENOUS
  Filled 2022-08-07: qty 0.5

## 2022-08-07 MED ORDER — LIDOCAINE HCL (CARDIAC) PF 100 MG/5ML IV SOSY
PREFILLED_SYRINGE | INTRAVENOUS | Status: DC | PRN
  Administered 2022-08-07: 80 mg via INTRAVENOUS

## 2022-08-07 MED ORDER — EPHEDRINE SULFATE (PRESSORS) 50 MG/ML IJ SOLN
INTRAMUSCULAR | Status: DC | PRN
  Administered 2022-08-07: 10 mg via INTRAVENOUS

## 2022-08-07 MED ORDER — GENTAMICIN SULFATE 40 MG/ML IJ SOLN
5.0000 mg/kg | INTRAVENOUS | Status: AC
  Administered 2022-08-07: 439.2 mg via INTRAVENOUS
  Filled 2022-08-07 (×2): qty 11

## 2022-08-07 MED ORDER — PROPOFOL 10 MG/ML IV BOLUS
INTRAVENOUS | Status: DC | PRN
  Administered 2022-08-07: 200 mg via INTRAVENOUS
  Administered 2022-08-07: 50 mg via INTRAVENOUS

## 2022-08-07 MED ORDER — SODIUM CHLORIDE 0.9 % IR SOLN
Status: DC | PRN
  Administered 2022-08-07 (×3): 6000 mL

## 2022-08-07 MED ORDER — PROPOFOL 10 MG/ML IV BOLUS
INTRAVENOUS | Status: AC
  Filled 2022-08-07: qty 20

## 2022-08-07 MED ORDER — ACETAMINOPHEN 325 MG PO TABS
650.0000 mg | ORAL_TABLET | Freq: Four times a day (QID) | ORAL | Status: DC | PRN
  Administered 2022-08-07: 650 mg via ORAL

## 2022-08-07 MED ORDER — DEXAMETHASONE SODIUM PHOSPHATE 10 MG/ML IJ SOLN
INTRAMUSCULAR | Status: AC
  Filled 2022-08-07: qty 1

## 2022-08-07 MED ORDER — AMITRIPTYLINE HCL 10 MG PO TABS
10.0000 mg | ORAL_TABLET | Freq: Every day | ORAL | Status: DC
  Administered 2022-08-07: 10 mg via ORAL
  Filled 2022-08-07: qty 1

## 2022-08-07 MED ORDER — SODIUM CHLORIDE 0.9 % IR SOLN: 3000.0000 mL | Status: DC

## 2022-08-07 MED ORDER — FENTANYL CITRATE (PF) 100 MCG/2ML IJ SOLN: 25.0000 ug | INTRAMUSCULAR | Status: DC | PRN

## 2022-08-07 MED ORDER — CEPHALEXIN 500 MG PO CAPS: 500.0000 mg | ORAL_CAPSULE | Freq: Four times a day (QID) | ORAL | 0 refills | Status: AC

## 2022-08-07 MED ORDER — FENTANYL CITRATE (PF) 100 MCG/2ML IJ SOLN
INTRAMUSCULAR | Status: DC | PRN
  Administered 2022-08-07: 25 ug via INTRAVENOUS
  Administered 2022-08-07: 50 ug via INTRAVENOUS
  Administered 2022-08-07: 25 ug via INTRAVENOUS
  Administered 2022-08-07: 50 ug via INTRAVENOUS
  Administered 2022-08-07 (×2): 25 ug via INTRAVENOUS

## 2022-08-07 MED ORDER — ALLOPURINOL 100 MG PO TABS
200.0000 mg | ORAL_TABLET | Freq: Every day | ORAL | Status: DC
  Administered 2022-08-07: 200 mg via ORAL
  Filled 2022-08-07: qty 2

## 2022-08-07 MED ORDER — ACETAMINOPHEN 10 MG/ML IV SOLN: 1000.0000 mg | Freq: Once | INTRAVENOUS | Status: DC | PRN

## 2022-08-07 MED ORDER — DOCUSATE SODIUM 100 MG PO CAPS
ORAL_CAPSULE | ORAL | Status: AC
  Filled 2022-08-07: qty 1

## 2022-08-07 MED ORDER — SODIUM CHLORIDE 0.45 % IV SOLN: INTRAVENOUS | Status: DC

## 2022-08-07 MED ORDER — STERILE WATER FOR IRRIGATION IR SOLN
Status: DC | PRN
  Administered 2022-08-07: 500 mL

## 2022-08-07 MED ORDER — LIDOCAINE HCL (PF) 2 % IJ SOLN
INTRAMUSCULAR | Status: AC
  Filled 2022-08-07: qty 5

## 2022-08-07 MED ORDER — DEXAMETHASONE SODIUM PHOSPHATE 4 MG/ML IJ SOLN
INTRAMUSCULAR | Status: DC | PRN
  Administered 2022-08-07: 5 mg via INTRAVENOUS

## 2022-08-07 MED ORDER — ONDANSETRON HCL 4 MG PO TABS: 4.0000 mg | ORAL_TABLET | Freq: Three times a day (TID) | ORAL | Status: DC | PRN

## 2022-08-07 MED ORDER — ALPRAZOLAM 0.5 MG PO TABS
ORAL_TABLET | ORAL | Status: AC
  Filled 2022-08-07: qty 1

## 2022-08-07 MED ORDER — ATENOLOL 12.5 MG HALF TABLET
12.5000 mg | ORAL_TABLET | Freq: Every day | ORAL | Status: DC
  Administered 2022-08-08: 12.5 mg via ORAL
  Filled 2022-08-07: qty 1

## 2022-08-07 MED ORDER — TRAMADOL HCL 50 MG PO TABS: 50.0000 mg | ORAL_TABLET | Freq: Four times a day (QID) | ORAL | 0 refills | Status: DC | PRN

## 2022-08-07 MED ORDER — DOCUSATE SODIUM 100 MG PO CAPS
100.0000 mg | ORAL_CAPSULE | Freq: Two times a day (BID) | ORAL | Status: DC
  Administered 2022-08-07: 100 mg via ORAL

## 2022-08-07 MED ORDER — ALPRAZOLAM 0.5 MG PO TABS
0.5000 mg | ORAL_TABLET | Freq: Four times a day (QID) | ORAL | Status: DC
  Administered 2022-08-07 – 2022-08-08 (×3): 0.5 mg via ORAL

## 2022-08-07 MED ORDER — GENTAMICIN SULFATE 40 MG/ML IJ SOLN
5.0000 mg/kg | INTRAVENOUS | Status: DC
  Filled 2022-08-07: qty 13.75

## 2022-08-07 MED ORDER — LACTATED RINGERS IV SOLN: INTRAVENOUS | Status: DC

## 2022-08-07 MED ORDER — TRAMADOL HCL 50 MG PO TABS: 50.0000 mg | ORAL_TABLET | Freq: Four times a day (QID) | ORAL | Status: DC | PRN

## 2022-08-07 MED ORDER — ACETAMINOPHEN 500 MG PO TABS: 1000.0000 mg | ORAL_TABLET | Freq: Four times a day (QID) | ORAL | Status: DC | PRN

## 2022-08-07 MED ORDER — ACETAMINOPHEN 325 MG PO TABS
ORAL_TABLET | ORAL | Status: AC
  Filled 2022-08-07: qty 2

## 2022-08-07 MED ORDER — ONDANSETRON HCL 4 MG/2ML IJ SOLN
INTRAMUSCULAR | Status: DC | PRN
  Administered 2022-08-07: 4 mg via INTRAVENOUS

## 2022-08-07 SURGICAL SUPPLY — 26 items
BAG DRAIN URO-CYSTO SKYTR STRL (DRAIN) ×2 IMPLANT
BAG DRN RND TRDRP ANRFLXCHMBR (UROLOGICAL SUPPLIES) ×1
BAG DRN UROCATH (DRAIN) ×1
BAG URINE DRAIN 2000ML AR STRL (UROLOGICAL SUPPLIES) ×2 IMPLANT
CATH FOLEY 2WAY SLVR 30CC 24FR (CATHETERS) IMPLANT
CATH FOLEY 3WAY 30CC 22FR (CATHETERS) ×2 IMPLANT
CATH FOLEY 3WAY 30CC 24FR (CATHETERS) ×1
CATH URTH STD 24FR FL 3W 2 (CATHETERS) IMPLANT
ELECT REM PT RETURN 9FT ADLT (ELECTROSURGICAL)
ELECTRODE REM PT RTRN 9FT ADLT (ELECTROSURGICAL) ×2 IMPLANT
EVACUATOR MICROVAS BLADDER (UROLOGICAL SUPPLIES) IMPLANT
GLOVE BIO SURGEON STRL SZ7.5 (GLOVE) ×2 IMPLANT
GOWN STRL REUS W/TWL XL LVL3 (GOWN DISPOSABLE) ×2 IMPLANT
HOLDER FOLEY CATH W/STRAP (MISCELLANEOUS) IMPLANT
IV NS IRRIG 3000ML ARTHROMATIC (IV SOLUTION) ×8 IMPLANT
KIT TURNOVER CYSTO (KITS) ×2 IMPLANT
LOOP CUT BIPOLAR 24F LRG (ELECTROSURGICAL) IMPLANT
MANIFOLD NEPTUNE II (INSTRUMENTS) ×2 IMPLANT
NS IRRIG 1000ML POUR BTL (IV SOLUTION) IMPLANT
PACK CYSTO (CUSTOM PROCEDURE TRAY) ×2 IMPLANT
SYR 30ML LL (SYRINGE) IMPLANT
SYR TOOMEY IRRIG 70ML (MISCELLANEOUS) ×1
SYRINGE TOOMEY IRRIG 70ML (MISCELLANEOUS) IMPLANT
TUBE CONNECTING 12X1/4 (SUCTIONS) IMPLANT
TUBING UROLOGY SET (TUBING) IMPLANT
WATER STERILE IRR 500ML POUR (IV SOLUTION) IMPLANT

## 2022-08-07 NOTE — H&P (Signed)
66 year old male with a long history of voiding dysfunction and lower urinary tract symptoms who presents today to reestablish as a patient in [place]. The patient has been followed here by several providers before transitioning his care to [facility].   The patient's past urologic history is significant for what was initially considered a neurogenic bladder. He was placed on clean intermittent catheterization twice daily to help facilitate bladder emptying. This was going relatively well, but then the patient started to have recurrent and incompletely treated urinary tract infections. He underwent a comprehensive evaluation with cystoscopy and urodynamics. This demonstrated no clear evidence of obstruction, but he was noted to have a high median bar. The patient was started on Rapaflo, and his symptoms got significantly better. At this point he is no longer cathing. However, he is describing a very weak and splayed stream. He feels if he empties his bladder, but this takes quite a while. He denies any hematuria or dysuria. He has not been treated for more than 1 urinary tract infection over the last 3 years.   Patient was started on Uroxatrol initially when he saw Dr. Terance Hart more than 15 years ago. Not tolerate this.   At his last visit we transitioned him to tamsulosin. He did not tolerate the tamsulosin and was complaining of significant nausea and fatigue. He transition back to Rapaflo. Since he was last seen, he is emptying his bladder, but has a very weak stream. He still has urinary frequency. He feels if he is emptying well though and does not have any symptoms of infection.   The patient has been dealing with family issues including a cancer diagnosis and surgery from his wife and a social situation with his daughter that he has been quite involved with and helping out quite a bit.    Interval: Today the patient is here for follow-up. Over the past 4 to 5 months he has had significant  difficulty with incomplete bladder emptying. He has not been able to cath, and has had several recurrent infections. He currently has a Foley catheter and has been drink a lot of water and tolerating it well. He is scheduled for a TURP in the coming weeks. He is due for Foley catheter change today.   07/31/2022:  Patient presents for preoperative urinalysis and culture. Patient with Foley catheter, and tolerating it well. He denies signs of infection, leaking from around the catheter, meatal discharge or blood, cloudy urine with sedimentation, dysuria, gross hematuria, fevers/chills, nausea/vomiting, suprapubic discomfort, flank pain.     ALLERGIES: Augmentin - Intolerance - Tachycardia Ciprofloxacin HCl TABS Erythromycin TABS Sulfa - Nausea, intolerance Uroxatral TB24     Notes: Per patient he is sensitive to high doses of antibiotics.   MEDICATIONS: Silodosin 8 mg capsule TAKE 1 CAPSULE IN THE EVENING WITH A MEAL.  Allopurinol 100 MG Oral Tablet Oral  ALPRAZolam 2 MG Oral Tablet Oral  Amitriptyline HCl - 25 MG Oral Tablet Oral  Atenolol 25 MG Oral Tablet Oral  Colchicine PRN  Zeasorb Af 2 % powder     GU PSH: Catheterize For Residual - 2020, 2020, 2019, 2019, 2018 Cystoscopy - 2019     NON-GU PSH: Shoulder Surgery (Unspecified)     GU PMH: Bladder-neck stenosis/contracture - 07/22/2022, - 01/22/2022, - 01/11/2022, - 10/08/2021, Bladder neck contracture, - 2014 BPH w/LUTS - 07/22/2022, - 05/16/2022, - 05/10/2022, - 05/06/2022, - 04/30/2022, - 03/26/2022, - 02/22/2022, - 02/18/2022, - 01/31/2022 Incomplete bladder emptying - 07/22/2022, - 06/17/2022, - 05/16/2022, -  05/10/2022, - 05/06/2022, - 04/30/2022, - 03/26/2022, - 03/21/2022, - 02/22/2022, - 02/18/2022 (Stable), - 01/31/2022, - 01/22/2022, - 01/11/2022 (Stable), - 07/23/2021, - 2018 Urinary Retention (Stable) - 05/29/2022, (Stable), - 01/04/2022, - 2020, - 2019, - 2019, - 2019 (Stable), I discussed with him the fact that it in my opinion based on my  cystoscopic findings recently with no false passages or other anatomic obstructions to catheterization that his difficulty passing a catheter was likely secondary to the sphincter possibly some mild sphincter spasm. I told him in my opinion I would not recommend transurethral resection but this was recommended by a urologist in [facility] so what I have recommended is a 3rd opinion from a urologist at a tertiary care [facility] and will schedule him for an appointment., - 2019 (Stable), He continues to void spontaneously to some degree but also requires self-catheterization for more complete bladder emptying., - 2019, - 2018 Acute Cystitis/UTI - 05/27/2022, - 04/30/2022, He appears to have an acute cystitis. His urine will be cultured and I will empirically begin antibiotics using doxycycline. He will also receive 1 g of Rocephin IM here in the office. I will then adjust antibiotics according to sensitivities., - 2019 Weak Urinary Stream - 01/31/2022, (Stable), - 07/23/2021, Weak urinary stream, - 2014 Elevated PSA (Stable), It appears that he has another infection and I therefore do not want to get his PSA proximal to a significant infection so I am going to push back the PSA that we had planned for 1 month from now and will contact him with the results. - 2019, His PSA was done at the time he had an active infection and was in retention. I told him that I cannot 100% rule out the presence of prostate cancer but I think there is a very good chance that this is going to be inflammatory. What I have recommended is a repeat PSA in 4 weeks., - 2019 Urinary Frequency - 2018 Other difficulties with micturition, Difficulty voiding - 2015 Inflammatory Disease Prostate, Unspec, Prostatitis - 2014 Oth GU systems Signs/Symptoms, Bladder pain - 2014 Urinary Retention, Unspec, Incomplete bladder emptying - 2014 Urinary Tract Inf, Unspec site, Urinary tract infection - 2014      PMH Notes: Dysfunctional voiding  versus recurrent prostatitis: He initially was seen by Dr. Terance Hart in 5/09 with a slow urinary stream and frequency. He was found have an elevated PVR 163. Cystoscopy revealed a small, nonobstructing prostate. He was placed on Uroxatral which helped his symptoms but he could not tolerate the side effects.  He was seen and evaluated by Dr. Matilde Sprang in 6/10 and urodynamics revealed a large capacity bladder with poor contractions +/-dyssynergia but no obstruction. There was felt that because of his intermittent pattern of obstructive symptoms he likely had prostatitis and was treated with Keflex which she said had worked in the past. He also has responded to a combination of the Keflex, Mobic and Rapaflo.  In 2/18 he was taught CIC. It was felt that anxiety may play a significant part in his voiding dysfunction as well.   NON-GU PMH: Tinea cruris, He did develop a tenia infection and this has responded to fluconazole in the past so I have sent in a prescription for this medication for him today. - 2019 Encounter for general adult medical examination without abnormal findings, Encounter for preventive health examination - 2015 Anxiety, Anxiety - 2014 Arrhythmia, Rhythm Disorder - 2014 Gout, Gout - 2014 Personal history of other diseases of the circulatory system,  History of hypertension - 2014 Personal history of other diseases of the nervous system and sense organs, History of sleep apnea - 2014 Personal history of other endocrine, nutritional and metabolic disease, History of hypercholesterolemia - 2014 Personal history of other mental and behavioral disorders, History of depression - 2014 Acute myocardial infarction, unspecified    Immunizations: None   FAMILY HISTORY: Atrial Fibrillation - Father Blood In Urine - Father Family Health Status Number - Runs In Family Hypertension - Mother   SOCIAL HISTORY: Marital Status: Married Preferred Language: English; Ethnicity: Not Hispanic Or Latino;  Race: White    REVIEW OF SYSTEMS:    GU Review Male:   Patient denies frequent urination, hard to postpone urination, burning/ pain with urination, get up at night to urinate, leakage of urine, stream starts and stops, trouble starting your stream, have to strain to urinate , erection problems, and penile pain.  Gastrointestinal (Upper):   Patient denies nausea, vomiting, and indigestion/ heartburn.  Gastrointestinal (Lower):   Patient denies diarrhea and constipation.  Constitutional:   Patient denies fever, night sweats, weight loss, and fatigue.  Skin:   Patient denies skin rash/ lesion and itching.  Eyes:   Patient denies blurred vision and double vision.  Ears/ Nose/ Throat:   Patient denies sore throat and sinus problems.  Hematologic/Lymphatic:   Patient denies swollen glands and easy bruising.  Cardiovascular:   Patient denies leg swelling and chest pains.  Respiratory:   Patient denies cough and shortness of breath.  Endocrine:   Patient denies excessive thirst.  Musculoskeletal:   Patient denies back pain and joint pain.  Neurological:   Patient denies headaches and dizziness.  Psychologic:   Patient denies depression and anxiety.   VITAL SIGNS: None   GU PHYSICAL EXAMINATION:    Anus and Perineum: No hemorrhoids. No anal stenosis. No rectal fissure, no anal fissure. No edema, no dimple, no perineal tenderness, no anal tenderness.  Scrotum: No lesions. No edema. No cysts. No warts.  Epididymides: Right: no spermatocele, no masses, no cysts, no tenderness, no induration, no enlargement. Left: no spermatocele, no masses, no cysts, no tenderness, no induration, no enlargement.  Testes: No tenderness, no swelling, no enlargement left testes. No tenderness, no swelling, no enlargement right testes. Normal location left testes. Normal location right testes. No mass, no cyst, no varicocele, no hydrocele left testes. No mass, no cyst, no varicocele, no hydrocele right testes.  Urethral  Meatus: Normal size. No lesion, no wart, no discharge, no polyp. Normal location.  Penis: Circumcised, no warts, no cracks. No dorsal Peyronie's plaques, no left corporal Peyronie's plaques, no right corporal Peyronie's plaques, no scarring, no warts. No balanitis, no meatal stenosis.  Prostate: 40 gram or 2+ size. Left lobe normal consistency, right lobe normal consistency. Symmetrical lobes. No prostate nodule. Left lobe no tenderness, right lobe no tenderness.  Seminal Vesicles: Nonpalpable.  Sphincter Tone: Normal sphincter. No rectal tenderness. No rectal mass.    Notes: Foley catheter in place.   MULTI-SYSTEM PHYSICAL EXAMINATION:    Constitutional: Well-nourished. No physical deformities. Normally developed. Good grooming.  Neck: Neck symmetrical, not swollen. Normal tracheal position.  Respiratory: No labored breathing, no use of accessory muscles.   Cardiovascular: Normal temperature, normal extremity pulses, no swelling.   Skin: No paleness, no jaundice, no cyanosis.  Neurologic / Psychiatric: Oriented to time, oriented to place, oriented to person. No depression, no anxiety, no agitation.  Gastrointestinal: No mass, no suprapubic or bilateral CVA tenderness, no rigidity, non  obese abdomen.   Musculoskeletal: Normal gait and station of head and neck.     Complexity of Data:  Source Of History:  Patient, Medical Record Summary  Records Review:   Previous Doctor Records, Previous Patient Records  Urine Test Review:   Urinalysis   07/23/21 12/28/17 08/22/16 02/07/09  PSA  Total PSA 4.09 ng/mL 33.84 ng/dl 1.10 ng/dl 0.68     07/31/22  Urinalysis  Urine Appearance Clear   Urine Color Yellow   Urine Glucose Neg mg/dL  Urine Bilirubin Neg mg/dL  Urine Ketones Neg mg/dL  Urine Specific Gravity <=1.005   Urine Blood Neg ery/uL  Urine pH 6.0   Urine Protein Neg mg/dL  Urine Urobilinogen 0.2 mg/dL  Urine Nitrites Neg   Urine Leukocyte Esterase Neg leu/uL   PROCEDURES:           Urinalysis Dipstick Dipstick Cont'd  Color: Yellow Bilirubin: Neg mg/dL  Appearance: Clear Ketones: Neg mg/dL  Specific Gravity: <=1.005 Blood: Neg ery/uL  pH: 6.0 Protein: Neg mg/dL  Glucose: Neg mg/dL Urobilinogen: 0.2 mg/dL    Nitrites: Neg    Leukocyte Esterase: Neg leu/uL         Notes:   Pt came in for cath ua and cult for pre-op. I obtained and took to lab per [place], [place] BSRN   ASSESSMENT:      ICD-10 Details  1 GU:   BPH w/LUTS - N40.1 Chronic, Stable  2   Incomplete bladder emptying - R39.14 Chronic, Stable   PLAN:           Orders Labs Urine Culture CATH, Urinalysis          Schedule Return Visit/Planned Activity: Keep Scheduled Appointment          Document Letter(s):  Created for Patient: Clinical Summary         Notes:   UA today within normal limits. We will send for preemptive culture with pending surgery, and follow-up with patient as appropriate based on C&S result. Should the culture return positive, I will treat and consult with his urologist for plan going forward. For now, keep upcoming surgical appointment. Patient voiced understanding and is amenable to this plan.

## 2022-08-07 NOTE — Anesthesia Preprocedure Evaluation (Signed)
Anesthesia Evaluation  Patient identified by MRN, date of birth, ID band Patient awake    Reviewed: Allergy & Precautions, NPO status , Patient's Chart, lab work & pertinent test results  Airway Mallampati: III  TM Distance: >3 FB Neck ROM: Full    Dental no notable dental hx.    Pulmonary asthma , sleep apnea , former smoker   Pulmonary exam normal        Cardiovascular hypertension, Pt. on medications and Pt. on home beta blockers  Rhythm:Regular Rate:Normal     Neuro/Psych   Anxiety     negative neurological ROS     GI/Hepatic negative GI ROS, Neg liver ROS,,,  Endo/Other  diabetes    Renal/GU negative Renal ROS Bladder dysfunction      Musculoskeletal  (+) Arthritis , Osteoarthritis,    Abdominal Normal abdominal exam  (+)   Peds  Hematology negative hematology ROS (+)   Anesthesia Other Findings   Reproductive/Obstetrics                             Anesthesia Physical Anesthesia Plan  ASA: 3  Anesthesia Plan: General   Post-op Pain Management:    Induction: Intravenous  PONV Risk Score and Plan: 2 and Ondansetron, Dexamethasone, Midazolam and Treatment may vary due to age or medical condition  Airway Management Planned: Mask and LMA  Additional Equipment: None  Intra-op Plan:   Post-operative Plan: Extubation in OR  Informed Consent: I have reviewed the patients History and Physical, chart, labs and discussed the procedure including the risks, benefits and alternatives for the proposed anesthesia with the patient or authorized representative who has indicated his/her understanding and acceptance.     Dental advisory given  Plan Discussed with: CRNA  Anesthesia Plan Comments:        Anesthesia Quick Evaluation

## 2022-08-07 NOTE — Transfer of Care (Signed)
Immediate Anesthesia Transfer of Care Note  Patient: [name]  Procedure(s) Performed: Procedure(s) (LRB): TRANSURETHRAL RESECTION OF THE PROSTATE (TURP) (N/A)  Patient Location: PACU  Anesthesia Type: General  Level of Consciousness: awake, sedated, patient cooperative and responds to stimulation  Airway & Oxygen Therapy: Patient Spontanous Breathing and Patient connected to [place] oxygen  Post-op Assessment: Report given to PACU RN, Post -op Vital signs reviewed and stable and Patient moving all extremities  Post vital signs: Reviewed and stable  Complications: No apparent anesthesia complications

## 2022-08-07 NOTE — Progress Notes (Signed)
Office called, placed on hold for answering service

## 2022-08-07 NOTE — Interval H&P Note (Signed)
History and Physical Interval Note:  08/07/2022 10:04 AM  [name]  has presented today for surgery, with the diagnosis of URINARY RETENTION.  The various methods of treatment have been discussed with the patient and family. After consideration of risks, benefits and other options for treatment, the patient has consented to  Procedure(s) with comments: [place] (TURP) (N/A) - 90 MINUTES NEEDED FOR CASE as a surgical intervention.  The patient's history has been reviewed, patient examined, no change in status, stable for surgery.  I have reviewed the patient's chart and labs.  Questions were answered to the patient's satisfaction.     Ardis Hughs

## 2022-08-07 NOTE — Progress Notes (Signed)
Called into room due to patient feeling heart beating "too fast". V/S checked and stabel, no C/O pain and is otherwise asymptomatic. Continues to talk and ruminate over how anxious he has been and hasn't had any sleep.  Measures taken to console and assure that all is well and will continue to monitor. Strongly emphasized need to lie still and attempt to nap. Requesting to take additional Tenormin to reduce heart rate. Prescribed dosage was taken prior to arrival this morning,  Lights dimmed and encouraged to rest.  1615: Called back into room by patient who is becoming more agitated and claimed he is getting into trouble because of acceleration of heart rate. Pulse now 104/otherwise asymptomatic. Dr. Louis Meckel paged

## 2022-08-07 NOTE — Op Note (Signed)
Preoperative diagnosis:  Bladder outlet obstruction Urinary retention  Postoperative diagnosis:  same   Procedure:  Transurethral resection of prostate  Surgeon: Ardis Hughs, MD   Anesthesia: General   Complications: None   Intraoperative findings: Cystoscopy demonstrated a normal appearing bladder mucosa without tumor/stones/or abnormalities.  UOs were orthopic.  Prostate demonstrated  with lateral lobe obstruction.  EBL: 50cc   Specimens: Prostate chips  Indication: [name] is a 66 y.o. patient with bladder outlet obstruction/urinary retention/obstructive voiding symptoms.  After reviewing the management options for treatment, he elected to proceed with the above surgical procedure(s). We have discussed the potential benefits and risks of the procedure, side effects of the proposed treatment, the likelihood of the patient achieving the goals of the procedure, and any potential problems that might occur during the procedure or recuperation. Informed consent has been obtained.  Description of procedure:  The patient was taken to the operating room and general anesthesia was induced. The patient was placed in the dorsal lithotomy position, prepped and draped in the usual sterile fashion, and preoperative antibiotics were administered. A preoperative time-out was performed.   I then gently passed the 21 French 30 cystoscope into the patient's urethra and up into the bladder under visual guidance. A 360 cystoscopic evaluation was then performed with the above findings.  I then removed the 21 French cystoscopic sheath and replacement with a 26 French resectoscope sheath was passed and using the visual obturator under direct vision. An exchange the obturator for the resectoscope itself and the loop cautery. I then proceeded to create grooves within the prostate at the 7:00 position extending the defect from the bladder neck at 7:00 down to the prostate apex just lateral to the  verumontanum. I took this groove down to the capsule. I then performed a similar maneuver on the patient's left side at the 5:00 position taking the prostate down from the bladder neck to the apex and down the prostatic capsule. At this point I proceeded to resect the patient's right lateral lobe in a systematic fashion moving from the 7:00 position up to approximately 11. The resection was taken down to the prostate capsule. Hemostasis was achieved on this side prior to moving to the patient's left lateral lobe. A similar sequence was performed taking the prostate down from the 5:00 position to approximately the 1:00 position to the level of the prostatic capsule. I then completed the resection of the posterior wall as well as the area between 5:00 and 7:00 along the bladder neck. I was careful not to undermine the bladder neck or resect the inferior. I then did resect the area that was protruding into the prostatic urethra anteriorly.   Once I was satisfied that the prostate had been adequately resected I evacuated the prostate chips using a [place] syringe. I then reintroduced the resectoscope and ensured adequate hemostasis. I then left the bladder full and removed the resectoscope entirely. Exam under anesthesia demonstrated a normal sized prostate with no nodules.  I then placed a 24French three-way Foley catheter. I irrigated the catheter gently and removed all debris and clots. I then placed the patient on gentle continuous bladder irrigation.  He subsequently extubated and returned to PACU in excellent condition.  Ardis Hughs, MD

## 2022-08-07 NOTE — Discharge Instructions (Signed)

## 2022-08-07 NOTE — Anesthesia Procedure Notes (Signed)
Procedure Name: LMA Insertion Date/Time: 08/07/2022 10:29 AM  Performed by: Justice Rocher, CRNAPre-anesthesia Checklist: Patient identified, Emergency Drugs available, Suction available, Patient being monitored and Timeout performed Patient Re-evaluated:Patient Re-evaluated prior to induction Oxygen Delivery Method: Circle system utilized Preoxygenation: Pre-oxygenation with 100% oxygen Induction Type: IV induction Ventilation: Mask ventilation without difficulty LMA: LMA inserted LMA Size: 5.0 Number of attempts: 1 Airway Equipment and Method: Bite block Placement Confirmation: positive ETCO2, breath sounds checked- equal and bilateral and CO2 detector Tube secured with: Tape Dental Injury: Teeth and Oropharynx as per pre-operative assessment

## 2022-08-07 NOTE — OR Nursing (Signed)
Foley catheter removed in [facility] OR #2 by C. Shilah Hefel, RN

## 2022-08-08 ENCOUNTER — Encounter (HOSPITAL_BASED_OUTPATIENT_CLINIC_OR_DEPARTMENT_OTHER): Payer: Self-pay | Admitting: Urology

## 2022-08-08 ENCOUNTER — Telehealth: Payer: Self-pay | Admitting: Cardiovascular Disease

## 2022-08-08 DIAGNOSIS — N401 Enlarged prostate with lower urinary tract symptoms: Secondary | ICD-10-CM | POA: Diagnosis not present

## 2022-08-08 LAB — SURGICAL PATHOLOGY

## 2022-08-08 LAB — URINE CULTURE: Culture: NO GROWTH

## 2022-08-08 MED ORDER — ALPRAZOLAM 0.5 MG PO TABS
ORAL_TABLET | ORAL | Status: AC
  Filled 2022-08-08: qty 1

## 2022-08-08 NOTE — Telephone Encounter (Signed)
STAT if HR is under 50 or over 120 (normal HR is 60-100 beats per minute)  What is your heart rate?  After breakfast: 84  Last night at the hospital was: 104 Pt states when he went to sleep it went back down around 70 12:55pm: bp 115/60 hr67  Pt states his bp and hr has been around the same range all day    Do you have any other symptoms? No, pt states he has been having some hr issues since his surgery yesterday (notes in Epic). He states it happens every time after he eats his hr starts pounding and he has never experienced this before. He would just like some insight on why this was occurring and is it something he needs to be worried about.

## 2022-08-08 NOTE — Discharge Summary (Signed)
Date of admission: 08/07/2022  Date of discharge: 08/08/2022  Admission diagnosis: urinary retention  Discharge diagnosis: same  Secondary diagnoses:  Patient Active Problem List   Diagnosis Date Noted   Urinary retention 08/07/2022   Aneurysm of left common iliac artery ([place]) 03/27/2022   Preoperative cardiovascular examination 03/27/2022   Aortic atherosclerosis ([place]) 03/27/2022   Tachycardia    Ejection fraction    Palpitations    Chest pain    Sleep apnea    Gout    Anxiety    Tobacco abuse    Diabetes mellitus ([place])    Asthma    Diverticular disease    Overweight(278.02)    Fatigue     Procedures performed: Procedure(s): TRANSURETHRAL RESECTION OF THE PROSTATE (TURP)  History and Physical: For full details, please see admission history and physical. Briefly, Craig [name] is a 66 y.o. year old patient with history of urinary retention.   Hospital Course: Patient tolerated the procedure well.  He was then transferred to the floor after an uneventful PACU stay.  His hospital course was uncomplicated.  On POD#1 he had met discharge criteria: was eating a regular diet, was up and ambulating independently,  pain was well controlled,  and was ready to for discharge.  He was discharged home after weaning off of the continuous bladder irrigation.  PE: Vitals:   08/07/22 1928 08/07/22 2148 08/08/22 0145 08/08/22 0452  BP: (!) 110/48 (!) 101/54 (!) 96/55 (!) 103/55  Pulse: 85 81 67 70  Resp: _0 Temp: (!) 97.5 F (36.4 C) (!) 97.5 F (36.4 C) 97.6 F (36.4 C) (!) 97.5 F (36.4 C)  TempSrc:      SpO2: 98% 96% 94% 100%  Weight:      Height:        Intake/Output Summary (Last 24 hours) at 08/08/2022 0734 Last data filed at 08/08/2022 0452 Gross per 24 hour  Intake 7865 ml  Output 10300 ml  Net -2435 ml   NAD Abdomen is soft, no SP tenderness Extremities symmetric Catheter draining clear yellow urine   Laboratory values:  Recent Labs     08/07/22 0900  WBC 6.6  HGB 16.0  HCT 44.3   Recent Labs    08/07/22 0900  NA 139  K 4.0  CL 106  CO2 24  GLUCOSE 99  BUN 15  CREATININE 0.81  CALCIUM 9.2   No results for input(s): "LABPT", "INR" in the last 72 hours. No results for input(s): "LABURIN" in the last 72 hours. Results for orders placed or performed during the hospital encounter of 07/02/21  Novel Coronavirus, NAA ([facility])     Status: None   Collection Time: 07/02/21 11:53 AM   Specimen: Nasopharyngeal(NP) swabs in vial transport medium   Nasopharynge  Result Value Ref Range Status   SARS-CoV-2, NAA Not Detected Not Detected Final    Comment: This nucleic acid amplification test was developed and its performance characteristics determined by [employer]. Nucleic acid amplification tests include RT-PCR and TMA. This test has not been FDA cleared or approved. This test has been authorized by FDA under an Emergency Use Authorization (EUA). This test is only authorized for the duration of time the declaration that circumstances exist justifying the authorization of the emergency use of in vitro diagnostic tests for detection of SARS-CoV-2 virus and/or diagnosis of COVID-19 infection under section 564(b)(1) of the Act, 21 U.S.C. 045TXH-7(S) (1), unless the authorization is terminated or revoked sooner. When diagnostic testing  is negative, the possibility of a false negative result should be considered in the context of a patient's recent exposures and the presence of clinical signs and symptoms consistent with COVID-19. An individual without symptoms of COVID-19 and who is not shedding SARS-CoV-2 virus wo uld expect to have a negative (not detected) result in this assay.   SARS-COV-2, NAA 2 DAY TAT     Status: None   Collection Time: 07/02/21 11:53 AM   Nasopharynge  Result Value Ref Range Status   SARS-CoV-2, NAA 2 DAY TAT Performed  Final    Disposition: Home  Discharge instruction: The  patient was instructed to be ambulatory but told to refrain from heavy lifting, strenuous activity, or driving.   Discharge medications:  Allergies as of 08/08/2022       Reactions   Toprol Xl [metoprolol Succinate] Anaphylaxis   Pt states cardiac arrest occurred when given. Dr Eliane Decree as the EDP.   Other    Pt states he does not do well with myocins   Sulfamethoxazole-trimethoprim    Azithromycin Anxiety   Allergy if on long periods at a time   Ciprofloxacin Anxiety   nervous   Wheat Bran Rash   Was told according to a skin test    Zithromax [azithromycin Dihydrate] Anxiety   Allergy if on long periods at a time        Medication List     TAKE these medications    allopurinol 100 MG tablet Commonly known as: ZYLOPRIM Take 200 mg by mouth daily.   ALPRAZolam 0.5 MG tablet Commonly known as: XANAX Take 0.5 mg by mouth 4 (four) times daily.   amitriptyline 10 MG tablet Commonly known as: ELAVIL Take 10 mg by mouth at bedtime.   aspirin EC 81 MG tablet Take 1 tablet (81 mg total) by mouth daily. Swallow whole.   atenolol 25 MG tablet Commonly known as: TENORMIN Take 0.5 tablets (12.5 mg total) by mouth daily.   cephALEXin 500 MG capsule Commonly known as: KEFLEX Take 1 capsule (500 mg total) by mouth 4 (four) times daily for 5 days.   colchicine 0.6 MG tablet Take 0.6 mg by mouth daily. As needed for gout flare.   indomethacin 25 MG capsule Commonly known as: INDOCIN Take 25 mg by mouth daily as needed for mild pain. If needed for gout   ketoconazole 2 % cream Commonly known as: NIZORAL Apply 1 Application topically as needed for irritation.   rosuvastatin 10 MG tablet Commonly known as: CRESTOR Take 1 tablet (10 mg total) by mouth daily.   STOOL SOFTENER PO Take 1 tablet by mouth 2 (two) times daily as needed (constipation). Per pt currently taking one at night and sometimes one in the morning   traMADol 50 MG tablet Commonly known as: Ultram Take  1-2 tablets (50-100 mg total) by mouth every 6 (six) hours as needed for moderate pain.        Followup:   Follow-up Information     Ardis Hughs, MD. Schedule an appointment as soon as possible for a visit in 5 day(s).   Specialty: Urology Why: Catheter removal Contact information: [place]  [phone]

## 2022-08-08 NOTE — Anesthesia Postprocedure Evaluation (Signed)
Anesthesia Post Note  Patient: Craig [name]  Procedure(s) Performed: TRANSURETHRAL RESECTION OF THE PROSTATE (TURP) (Prostate)     Patient location during evaluation: PACU Anesthesia Type: General Level of consciousness: awake and alert Pain management: pain level controlled Vital Signs Assessment: post-procedure vital signs reviewed and stable Respiratory status: spontaneous breathing, nonlabored ventilation, respiratory function stable and patient connected to nasal cannula oxygen Cardiovascular status: blood pressure returned to baseline and stable Postop Assessment: no apparent nausea or vomiting Anesthetic complications: no   No notable events documented.  Last Vitals:  Vitals:   08/08/22 0830 08/08/22 0913  BP: (!) 98/58 (!) 105/55  Pulse: 80 73  Resp: 20   Temp: 36.4 C   SpO2: 98% 100%    Last Pain:  Vitals:   08/08/22 0830  TempSrc:   PainSc: 0-No pain                 Belenda Cruise P Dellamae Rosamilia

## 2022-08-08 NOTE — Telephone Encounter (Signed)
I spoke with patient. He had TURP yesterday and stayed overnight at the day surgery center. He went home  today around 9:30 AM.  Patient states after supper last night he felt heart pounding and heart rate climbing.  Doesn't know heart rate for sure at that time but thinks it was 90-92.  After breakfast his heart rate was 85. His current heart rate is 73 and he is feeling fine.  He is concerned about pounding that he had and wanted to make Dr Burt Knack aware.  Patient is asking if heart rate climbs rapidly tonight if OK to take 12.5 mg atenolol.  I told him this would be OK . He is aware to check his heart rate before taking atenolol. He will not take unless heart rate is around 100 or over. Will forward to Dr Burt Knack regarding pounding and patient's heart rate concerns.

## 2022-08-09 NOTE — Telephone Encounter (Signed)
Agree with your recommendations. Thx Pat.

## 2022-08-12 NOTE — Telephone Encounter (Signed)
Called patient back to make him aware that Dr Burt Knack did agree with pat's recommendation of taking 12.'5mg'$  of Atenolol if needed. Patient states that both episodes occurred about 20 minutes after eating his evening meal. Felt his "heartbeating" in his chest. Patient states that while he was there, they didn't give him an extra Atenolol because his BP was slightly low. They gave him lorazepam. He believes the anesthesia was still in his system and coupled with the blood loss, his HR was increased. He states that he hasn't had this occur since being discharged. No further issues/questions.

## 2022-08-15 DIAGNOSIS — R338 Other retention of urine: Secondary | ICD-10-CM | POA: Diagnosis not present

## 2022-08-21 DIAGNOSIS — R350 Frequency of micturition: Secondary | ICD-10-CM | POA: Diagnosis not present

## 2022-08-23 DIAGNOSIS — E782 Mixed hyperlipidemia: Secondary | ICD-10-CM | POA: Diagnosis not present

## 2022-08-23 DIAGNOSIS — N411 Chronic prostatitis: Secondary | ICD-10-CM | POA: Diagnosis not present

## 2022-08-23 DIAGNOSIS — M1A09X0 Idiopathic chronic gout, multiple sites, without tophus (tophi): Secondary | ICD-10-CM | POA: Diagnosis not present

## 2022-08-23 DIAGNOSIS — E1165 Type 2 diabetes mellitus with hyperglycemia: Secondary | ICD-10-CM | POA: Diagnosis not present

## 2022-08-23 DIAGNOSIS — I700 Atherosclerosis of aorta: Secondary | ICD-10-CM | POA: Diagnosis not present

## 2022-08-23 DIAGNOSIS — Z1211 Encounter for screening for malignant neoplasm of colon: Secondary | ICD-10-CM | POA: Diagnosis not present

## 2022-08-26 DIAGNOSIS — R3914 Feeling of incomplete bladder emptying: Secondary | ICD-10-CM | POA: Diagnosis not present

## 2022-08-29 ENCOUNTER — Encounter (HOSPITAL_BASED_OUTPATIENT_CLINIC_OR_DEPARTMENT_OTHER): Payer: Self-pay | Admitting: Urology

## 2022-09-09 ENCOUNTER — Telehealth: Payer: Self-pay | Admitting: Family Medicine

## 2022-09-09 ENCOUNTER — Ambulatory Visit
Admission: EM | Admit: 2022-09-09 | Discharge: 2022-09-09 | Disposition: A | Discharge status: Home / Self Care | Payer: PPO | Attending: Family Medicine | Admitting: Family Medicine

## 2022-09-09 DIAGNOSIS — B9689 Other specified bacterial agents as the cause of diseases classified elsewhere: Secondary | ICD-10-CM | POA: Diagnosis not present

## 2022-09-09 DIAGNOSIS — N390 Urinary tract infection, site not specified: Secondary | ICD-10-CM | POA: Diagnosis present

## 2022-09-09 DIAGNOSIS — Z9889 Other specified postprocedural states: Secondary | ICD-10-CM

## 2022-09-09 LAB — POCT URINALYSIS DIP (MANUAL ENTRY)
Bilirubin, UA: NEGATIVE
Glucose, UA: NEGATIVE mg/dL
Ketones, POC UA: NEGATIVE mg/dL
Nitrite, UA: NEGATIVE
Protein Ur, POC: 30 mg/dL — AB
Spec Grav, UA: 1.01 (ref 1.010–1.025)
Urobilinogen, UA: 0.2 E.U./dL
pH, UA: 6.5 (ref 5.0–8.0)

## 2022-09-09 MED ORDER — NITROFURANTOIN MONOHYD MACRO 100 MG PO CAPS: 100.0000 mg | ORAL_CAPSULE | Freq: Two times a day (BID) | ORAL | 0 refills | Status: DC

## 2022-09-09 NOTE — ED Provider Notes (Signed)
[name]    CSN: [id number] Arrival date & time: 09/09/22  4098      History   Chief Complaint Chief Complaint  Patient presents with   cloudy urine    HPI Craig [name] is a 67 y.o. male.   HPI  Patient well-known to office.  Recently had prostate surgery for TURP.  He is self cathing.  He is here for urinary tract infection.  He had cloudy urine and some dysuria and low back pain off and on for a week.  No fever or chills.  No kidney pain.  No nausea or vomiting.  He has not had a bladder infection since his prostate surgery.  Past Medical History:  Diagnosis Date   Aneurysm of left common iliac artery ([place]) 04/2021   followed by cardiology---   distal 3cm and prox 2.3,  stable per last CT 04-17-2021  incidental finding   BPH with urinary obstruction    Degenerative arthritis of spine    Diverticulosis of colon    Foley catheter in place    GAD (generalized anxiety disorder)    History of prostatitis    Hyperlipemia    Hypertension    Idiopathic chronic gout    followed by pcp  (previous seen by rheumatologist-- dr Charlestine Night);    multiple sites  (07-25-2022  per pt last flare-up 2 wks ago, right foot/ ankle,  stated resolved)   OSA (obstructive sleep apnea)    by dr Halford Chessman---  per pt dx yrs ago but did not use cpap, refused   Palpitations    followed by cardiology-- dr Burt Knack;    last event monitor 09-16-2017  SR w/ SB  few PVCs/ PACs   Pre-diabetes    PVC's (premature ventricular contractions)    Tachycardia    January, 2013   Urinary retention    Vitreous floaters of both eyes    Wears glasses     Patient Active Problem List   Diagnosis Date Noted   Urinary retention 08/07/2022   Aneurysm of left common iliac artery ([place]) 03/27/2022   Preoperative cardiovascular examination 03/27/2022   Aortic atherosclerosis ([place]) 03/27/2022   Tachycardia    Ejection fraction    Palpitations    Chest pain    Sleep apnea    Gout    Anxiety    Tobacco abuse     Diabetes mellitus ([place])    Asthma    Diverticular disease    Overweight(278.02)    Fatigue     Past Surgical History:  Procedure Laterality Date   SHOULDER SURGERY Right 1978   for recurrent dislocation   TRANSURETHRAL RESECTION OF PROSTATE N/A 08/07/2022   Procedure: TRANSURETHRAL RESECTION OF THE PROSTATE (TURP);  Surgeon: Ardis Hughs, MD;  Location: [facility];  Service: Urology;  Laterality: N/A;  90 MINUTES NEEDED FOR CASE   WISDOM TOOTH EXTRACTION         Home Medications    Prior to Admission medications   Medication Sig Start Date End Date Taking? Authorizing Provider  nitrofurantoin, macrocrystal-monohydrate, (MACROBID) 100 MG capsule Take 1 capsule (100 mg total) by mouth 2 (two) times daily. 09/09/22  Yes Raylene Everts, MD  allopurinol (ZYLOPRIM) 100 MG tablet Take 200 mg by mouth daily.    [provider]  ALPRAZolam Duanne Moron) 0.5 MG tablet Take 0.5 mg by mouth 4 (four) times daily.    [provider]  amitriptyline (ELAVIL) 10 MG tablet Take 10 mg by  mouth at bedtime.    [provider]  aspirin EC 81 MG tablet Take 1 tablet (81 mg total) by mouth daily. Swallow whole. Patient not taking: Reported on 07/25/2022 03/27/22   Richardson Dopp T, PA-C  atenolol (TENORMIN) 25 MG tablet Take 0.5 tablets (12.5 mg total) by mouth daily. 03/27/22   Richardson Dopp T, PA-C  colchicine 0.6 MG tablet Take 0.6 mg by mouth daily. As needed for gout flare.    [provider]  Docusate Calcium (STOOL SOFTENER PO) Take 1 tablet by mouth 2 (two) times daily as needed (constipation). Per pt currently taking one at night and sometimes one in the morning    [provider]  ketoconazole (NIZORAL) 2 % cream Apply 1 Application topically as needed for irritation.    [provider]  rosuvastatin (CRESTOR) 10 MG tablet Take 1 tablet (10 mg total) by mouth daily. Patient not taking: Reported on 07/25/2022 03/27/22    Richardson Dopp T, PA-C  traMADol (ULTRAM) 50 MG tablet Take 1-2 tablets (50-100 mg total) by mouth every 6 (six) hours as needed for moderate pain. 08/07/22   Ardis Hughs, MD    Family History Family History  Problem Relation Age of Onset   Hypertension Mother    Diverticulosis Sister    Diverticulosis Maternal Grandfather     Social History Social History   Tobacco Use   Smoking status: Former    Types: Cigarettes   Smokeless tobacco: Never   Tobacco comments:    07-25-2022  per pt smoked for 1 yr in junior yr in high school  Vaping Use   Vaping Use: Never used  Substance Use Topics   Alcohol use: Not Currently   Drug use: Never     Allergies   Toprol xl [metoprolol succinate], Other, Sulfamethoxazole-trimethoprim, Azithromycin, Ciprofloxacin, Wheat bran, and Zithromax [azithromycin dihydrate]   Review of Systems Review of Systems  See HPI Physical Exam Triage Vital Signs ED Triage Vitals  Enc Vitals Group     BP 09/09/22 0932 119/75     Pulse Rate 09/09/22 0932 65     Resp 09/09/22 0932 12     Temp 09/09/22 0932 97.6 F (36.4 C)     Temp Source 09/09/22 0932 Oral     SpO2 09/09/22 0932 95 %     Weight --      Height --      Head Circumference --      Peak Flow --      Pain Score 09/09/22 0929 0     Pain Loc --      Pain Edu? --      Excl. in [place]? --    No data found.  Updated Vital Signs BP 119/75 (BP Location: Right Arm)   Pulse 65   Temp 97.6 F (36.4 C) (Oral)   Resp 12   SpO2 95%      Physical Exam Constitutional:      General: He is not in acute distress.    Appearance: He is well-developed.  HENT:     Head: Normocephalic and atraumatic.  Eyes:     Conjunctiva/sclera: Conjunctivae normal.     Pupils: Pupils are equal, round, and reactive to light.  Cardiovascular:     Rate and Rhythm: Normal rate.  Pulmonary:     Effort: Pulmonary effort is normal. No respiratory distress.  Abdominal:     General: There is no distension.      Palpations: Abdomen is soft.  Tenderness: There is no abdominal tenderness. There is no right CVA tenderness or left CVA tenderness.  Musculoskeletal:        General: Normal range of motion.     Cervical back: Normal range of motion.  Skin:    General: Skin is warm and dry.  Neurological:     Mental Status: He is alert.  Psychiatric:        Mood and Affect: Mood normal.      UC Treatments / Results  Labs (all labs ordered are listed, but only abnormal results are displayed) Labs Reviewed  POCT URINALYSIS DIP (MANUAL ENTRY) - Abnormal; Notable for the following components:      Result Value   Clarity, UA cloudy (*)    Blood, UA large (*)    Protein Ur, POC =30 (*)    Leukocytes, UA Large (3+) (*)    All other components within normal limits  URINE CULTURE    EKG   Radiology No results found.  Procedures Procedures (including critical care time)  Medications Ordered in UC Medications - No data to display  Initial Impression / Assessment and Plan / UC Course  I have reviewed the triage vital signs and the nursing notes.  Pertinent labs & imaging results that were available during my care of the patient were reviewed by me and considered in my medical decision making (see chart for details).     Discussed UTI.  The self cath public places him at some increased risk.  urology follow-up recommended Final Clinical Impressions(s) / UC Diagnoses   Final diagnoses:  Acute lower UTI (urinary tract infection)  History of prostate surgery     Discharge Instructions      Take Macrobid 2 times a day Make sure you are drinking lots of water Make an appointment to see your [facility] when you finish your antibiotics   ED Prescriptions     Medication Sig Dispense Auth. Provider   nitrofurantoin, macrocrystal-monohydrate, (MACROBID) 100 MG capsule Take 1 capsule (100 mg total) by mouth 2 (two) times daily. 14 capsule Raylene Everts, MD       PDMP not reviewed this encounter.   Raylene Everts, MD 09/09/22 1018

## 2022-09-09 NOTE — Discharge Instructions (Signed)
Take Macrobid 2 times a day Make sure you are drinking lots of water Make an appointment to see your [facility]rologist when you finish your antibiotics

## 2022-09-09 NOTE — Telephone Encounter (Signed)
Pharmacy change

## 2022-09-09 NOTE — ED Triage Notes (Signed)
Pt presents with c/o cloudy foul smelling urine x 1 week.

## 2022-09-11 ENCOUNTER — Telehealth: Payer: Self-pay | Admitting: Emergency Medicine

## 2022-09-11 LAB — URINE CULTURE: Culture: 10000 — AB

## 2022-09-11 NOTE — Telephone Encounter (Signed)
Returned patient's call regarding his urine culture results.  Advised that he is currently on the correct antibiotics for his infection.  Patient instructed to continue medication until complete.  Patient does have a follow up with his urologists on Monday.  All questions answered.

## 2022-10-14 IMAGING — DX DG CHEST 2V
2 series · 2 of 2 positions shown · non-contrast
Comparison: 07/02/2021

CLINICAL DATA: 65-year-old male with a history of cough

EXAM:
CHEST - 2 VIEW

[chest pa]
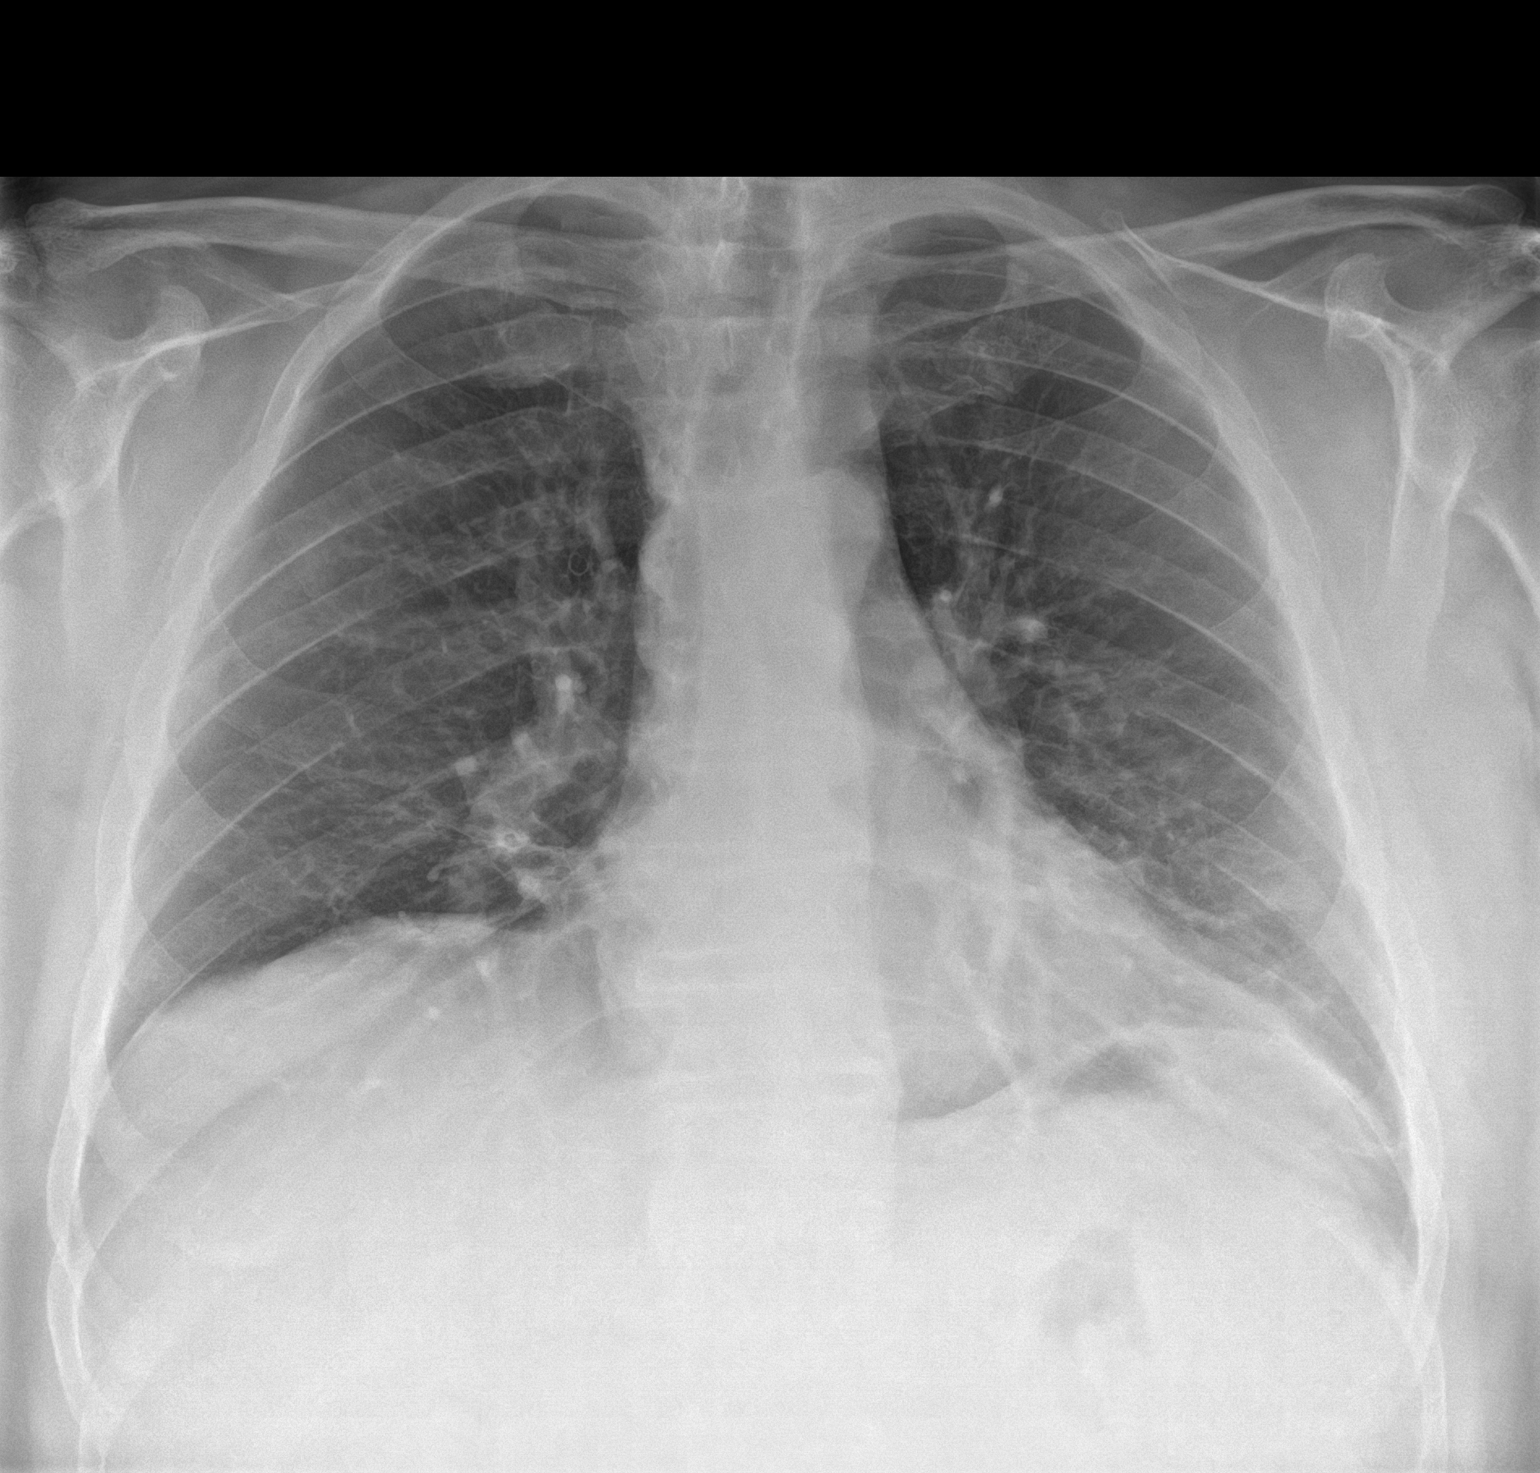

[chest lat]
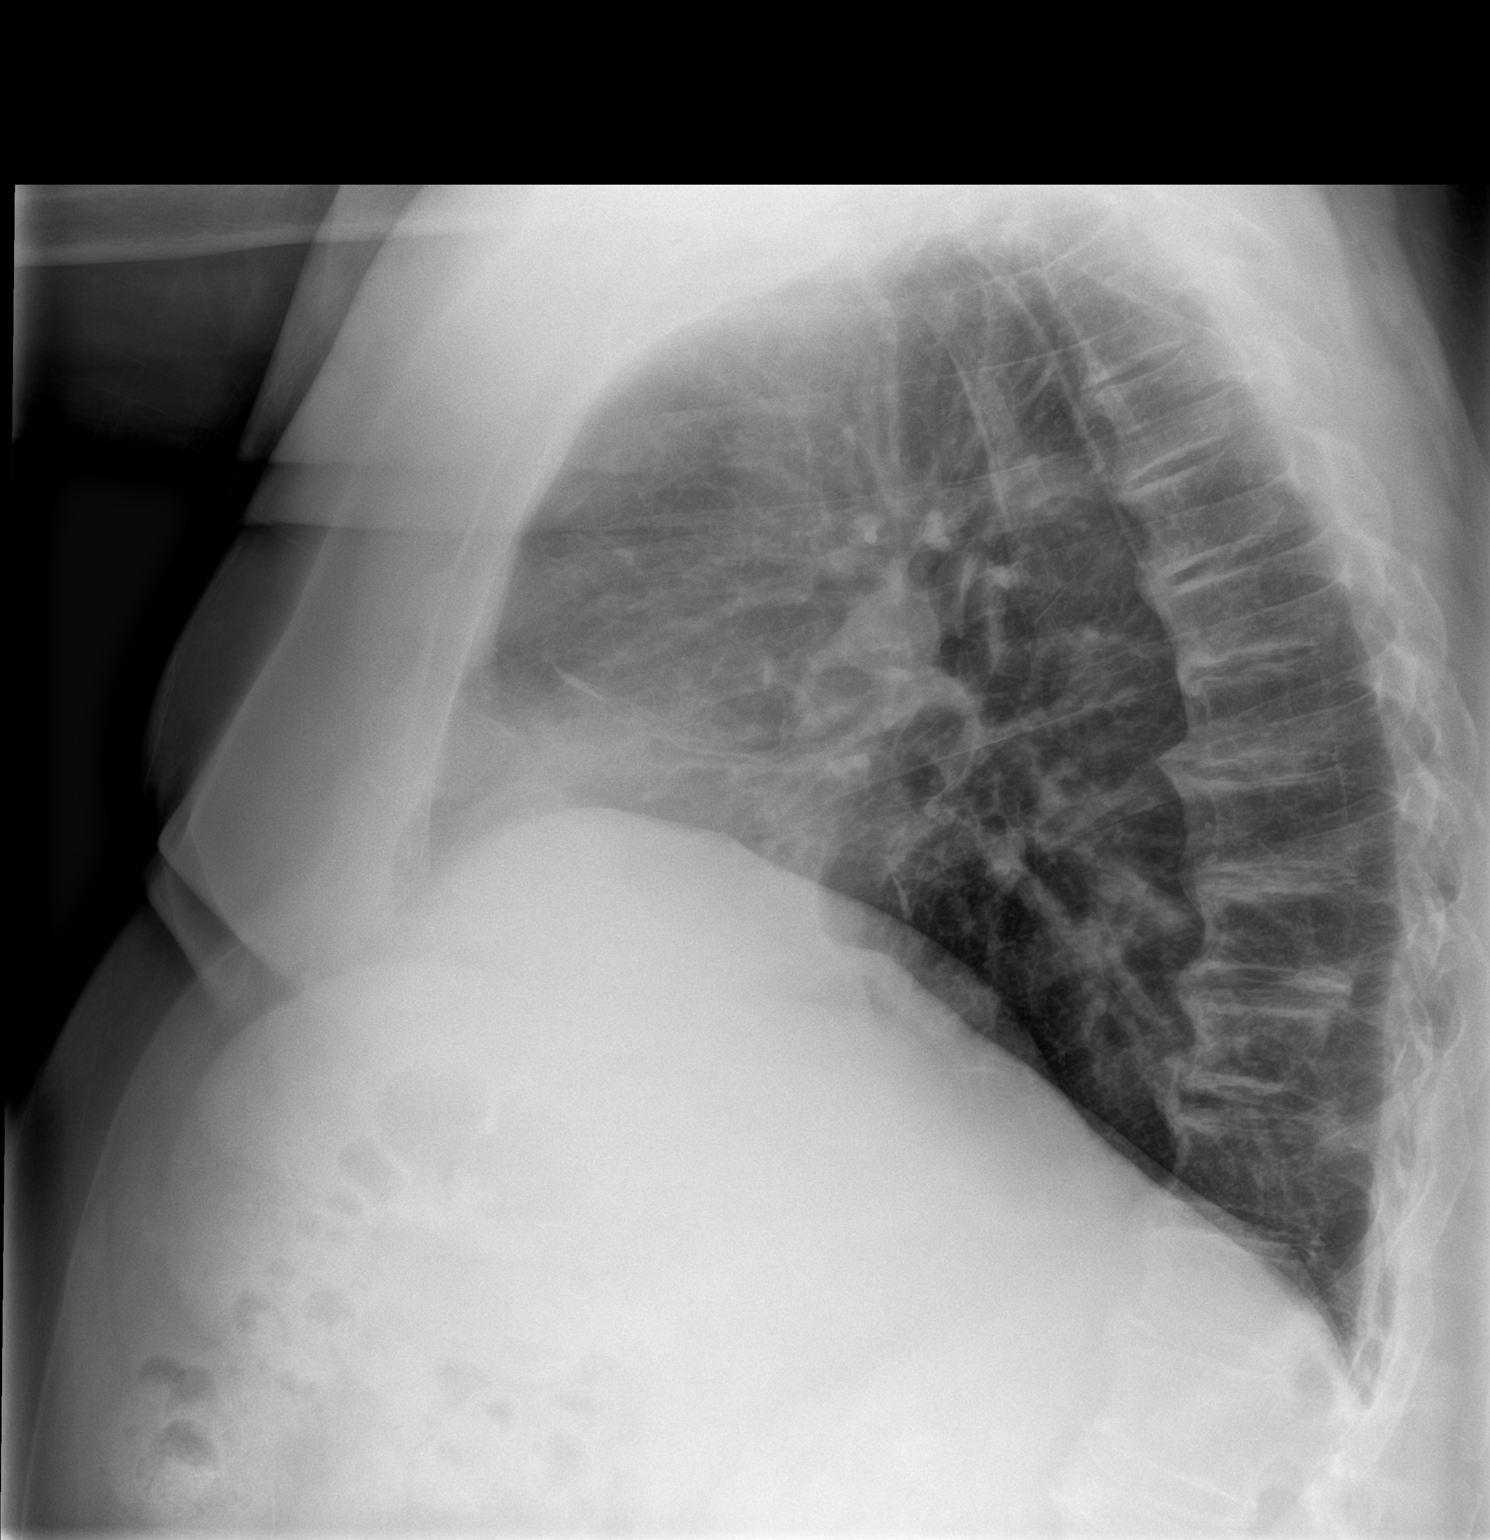

[2 of 2 positions shown; findings below may reference images not displayed]

FINDINGS: Cardiomediastinal silhouette unchanged in size and contour. No
evidence of central vascular congestion. No interlobular septal
thickening.

Low lung volumes persist with mild asymmetrical elevation of the
right hemidiaphragm.

No pneumothorax or pleural effusion. Coarsened interstitial
markings, with no confluent airspace disease.

No acute displaced fracture. Degenerative changes of the spine.
IMPRESSION: Low lung volumes without evidence of acute cardiopulmonary disease

## 2022-10-21 ENCOUNTER — Ambulatory Visit: Payer: PPO | Admitting: Behavioral Health

## 2022-11-13 ENCOUNTER — Other Ambulatory Visit (HOSPITAL_COMMUNITY): Payer: Self-pay | Admitting: Nephrology

## 2022-11-13 DIAGNOSIS — I129 Hypertensive chronic kidney disease with stage 1 through stage 4 chronic kidney disease, or unspecified chronic kidney disease: Secondary | ICD-10-CM

## 2022-11-13 DIAGNOSIS — R339 Retention of urine, unspecified: Secondary | ICD-10-CM

## 2022-11-13 DIAGNOSIS — R809 Proteinuria, unspecified: Secondary | ICD-10-CM

## 2022-11-15 ENCOUNTER — Ambulatory Visit (INDEPENDENT_AMBULATORY_CARE_PROVIDER_SITE_OTHER): Payer: PPO | Admitting: Mental Health

## 2022-11-15 DIAGNOSIS — F4323 Adjustment disorder with mixed anxiety and depressed mood: Secondary | ICD-10-CM | POA: Diagnosis not present
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
## 2022-11-25 ENCOUNTER — Encounter (HOSPITAL_COMMUNITY): Payer: Self-pay

## 2022-11-25 ENCOUNTER — Ambulatory Visit (HOSPITAL_COMMUNITY): Payer: PPO

## 2022-12-10 ENCOUNTER — Ambulatory Visit (INDEPENDENT_AMBULATORY_CARE_PROVIDER_SITE_OTHER): Payer: PPO | Admitting: Mental Health

## 2022-12-10 DIAGNOSIS — F4323 Adjustment disorder with mixed anxiety and depressed mood: Secondary | ICD-10-CM | POA: Diagnosis not present
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
## 2023-01-09 ENCOUNTER — Telehealth: Payer: Self-pay | Admitting: Cardiovascular Disease

## 2023-01-09 NOTE — Telephone Encounter (Signed)
Spoke with the patient who reports that over the last several weeks he has had increased shortness of breath with minimal activity. He states that he has been unusually tired and not been able to do the activities that he normally does. He states that he becomes winded after walking a short distance. He denies any swelling or chest pain. He did see his PCP recently and was prescribed a dose of steroids. He states that it caused his HR to become elevated so he stopped it. He has not called his PCP to follow up. I advised him to call his PCP back. Patient has an appointment with Tereso Newcomer, MD next week.

## 2023-01-09 NOTE — Telephone Encounter (Signed)
Pt c/o Shortness Of Breath: STAT if SOB developed within the last 24 hours or pt is noticeably SOB on the phone  1. Are you currently SOB (can you hear that pt is SOB on the phone)? no  2. How long have you been experiencing SOB? Last 3-5 days  3. Are you SOB when sitting or when up moving around? both  4. Are you currently experiencing any other symptoms? Tiredness

## 2023-01-10 NOTE — Telephone Encounter (Signed)
Agree with recommendations, reviewed chart and several medical issues could be contributing to his symptoms. thanks

## 2023-01-15 ENCOUNTER — Encounter: Payer: Self-pay | Admitting: Physician Assistant

## 2023-01-15 ENCOUNTER — Ambulatory Visit: Payer: PPO | Attending: Physician Assistant | Admitting: Physician Assistant

## 2023-01-15 VITALS — BP 102/68 | HR 76 | Ht 68.0 in | Wt 246.6 lb

## 2023-01-15 DIAGNOSIS — I700 Atherosclerosis of aorta: Secondary | ICD-10-CM | POA: Diagnosis not present

## 2023-01-15 DIAGNOSIS — R002 Palpitations: Secondary | ICD-10-CM | POA: Diagnosis not present

## 2023-01-15 DIAGNOSIS — R0602 Shortness of breath: Secondary | ICD-10-CM | POA: Diagnosis not present

## 2023-01-15 DIAGNOSIS — R9389 Abnormal findings on diagnostic imaging of other specified body structures: Secondary | ICD-10-CM

## 2023-01-15 DIAGNOSIS — G4733 Obstructive sleep apnea (adult) (pediatric): Secondary | ICD-10-CM

## 2023-01-15 NOTE — Assessment & Plan Note (Addendum)
He does not use CPAP.  As noted, he is seen today for shortness of breath.  Obtain echocardiogram as outlined to rule out significant pulmonary hypertension.

## 2023-01-15 NOTE — Assessment & Plan Note (Signed)
He has had a history of PVCs.  Holter monitor in 2019 demonstrated <1% PVCs.  He takes atenolol with good control.  Continue atenolol 12.5 mg daily.

## 2023-01-15 NOTE — Assessment & Plan Note (Signed)
There was a question of left common iliac aneurysm in the past.  This was ruled out on follow-up CT angiogram.  He was told by his urologist that he still had an aneurysm.  I reviewed the CT report again from August 2022.  This study indicates that he has no aneurysm.  I tried to reassure him again today.

## 2023-01-15 NOTE — Assessment & Plan Note (Signed)
He has noted shortness of breath with exertion over the past week.  He does not have any symptoms of heart failure.  His exam is also benign.  His electrocardiogram today demonstrates normal rhythm and no significant ST-T wave changes.  Etiology for his shortness of breath is not entirely clear.  His symptoms could be due to pulmonary disease, congestive heart failure, coronary artery disease, deconditioning.  I have recommended proceeding with an echocardiogram to rule out structural heart disease.  I will also obtain a BMET, CBC and BNP.  At this point, I do not think he needs an ischemic evaluation.  If his echocardiogram and lab work is normal, I would recommend follow-up with primary care to assess for other causes.

## 2023-01-15 NOTE — Progress Notes (Signed)
Cardiology Office Note:    Date:  01/15/2023  ID:  Craig [name], DOB Jul 19, 1956, MRN [id number] PCP: Craig Rutherford, NP  [facility] Providers Cardiologist:  Craig Bollman, MD Cardiology APP:  Craig Lecher, PA-C          Patient Profile:   Palpitations Symptomatic PVCs Hx of syncope (2008) Hx of ventricular bigeminy  Intol of bisoprolol due to bradycardia Holter 09/2017: PVCs <1% TTE 02/2011: EF 60%, mild LVH, normal diastolic function  Aortic atherosclerosis (CT 04/2021-[facility]) Stress echo 01/2011: Normal Hypertension  Hyperlipidemia   Sleep apnea  ??Left common iliac artery aneurysm>>r/o on CT in 8/22 CT 12/28/2017: Distal left CIA 3 cm, proximal left CIA 2.3 cm, normal aorta Abd/Pelvic CTA 04/2021 ([facility]): no aneurysm noted  BPH - urinary retention Chronic fatigue syndrome (2/2 EBV) Depression         History of Present Illness:   Craig [name] is a 67 y.o. male who returns for evaluation of shortness of breath.  He was last seen in July 2023.  He is here alone.  He has had issues with allergies in the past.  He also struggles with chronic fatigue syndrome.  He has had worsening fatigue this year.  He was significantly short of breath while doing yard work last week.  He has noted shortness of breath with exertion since.  He has not had chest discomfort, orthopnea, PND, leg edema, syncope.  He has sleep apnea but does not use CPAP.  He had COVID back in February and took Paxlovid.  He had no significant residual symptoms.  Review of Systems  Constitutional: Negative for fever.  Respiratory:  Negative for cough.   Gastrointestinal:  Negative for hematochezia and melena.  Genitourinary:  Negative for hematuria.   see HPI    Studies Reviewed:    EKG: NSR, HR 69, normal axis, no ST-T wave changes, QTc 420   Risk Assessment/Calculations:            Physical Exam:   VS:  BP 102/68   Pulse 76   Ht 5\' 8"  (1.727 m)   Wt 246 lb 9.6 oz (111.9 kg)    SpO2 96%   BMI 37.50 kg/m    Wt Readings from Last 3 Encounters:  01/15/23 246 lb 9.6 oz (111.9 kg)  08/07/22 242 lb 9.6 oz (110 kg)  03/31/22 241 lb (109.3 kg)    Constitutional:      Appearance: Healthy appearance. Not in distress.  Neck:     Vascular: JVD normal.  Pulmonary:     Breath sounds: Normal breath sounds. No wheezing. No rales.  Cardiovascular:     Normal rate. Regular rhythm. Normal S1. Normal S2.      Murmurs: There is no murmur.  Edema:    Peripheral edema absent.  Abdominal:     Palpations: Abdomen is soft.       ASSESSMENT AND PLAN:   Shortness of breath He has noted shortness of breath with exertion over the past week.  He does not have any symptoms of heart failure.  His exam is also benign.  His electrocardiogram today demonstrates normal rhythm and no significant ST-T wave changes.  Etiology for his shortness of breath is not entirely clear.  His symptoms could be due to pulmonary disease, congestive heart failure, coronary artery disease, deconditioning.  I have recommended proceeding with an echocardiogram to rule out structural heart disease.  I will also obtain a BMET, CBC and BNP.  At this point, I do not think he needs an ischemic evaluation.  If his echocardiogram and lab work is normal, I would recommend follow-up with primary care to assess for other causes.  Abnormal finding on CT scan There was a question of left common iliac aneurysm in the past.  This was ruled out on follow-up CT angiogram.  He was told by his urologist that he still had an aneurysm.  I reviewed the CT report again from August 2022.  This study indicates that he has no aneurysm.  I tried to reassure him again today.  Palpitations He has had a history of PVCs.  Holter monitor in 2019 demonstrated <1% PVCs.  He takes atenolol with good control.  Continue atenolol 12.5 mg daily.  Aortic atherosclerosis (HCC) Noted on previous CT scan.  Aspirin and statin therapy were recommended.   He has decided not to take these.  Sleep apnea He does not use CPAP.  As noted, he is seen today for shortness of breath.  Obtain echocardiogram as outlined to rule out significant pulmonary hypertension.      Dispo:  Return in about 6 months (around 07/18/2023) for Routine Follow Up, w/ Dr. Excell [name], or Craig Newcomer, PA-C.  Signed, Craig Newcomer, PA-C

## 2023-01-15 NOTE — Assessment & Plan Note (Signed)
Noted on previous CT scan.  Aspirin and statin therapy were recommended.  He has decided not to take these.

## 2023-01-15 NOTE — Patient Instructions (Signed)
Medication Instructions:  Your physician recommends that you continue on your current medications as directed. Please refer to the Current Medication list given to you today.  *If you need a refill on your cardiac medications before your next appointment, please call your pharmacy*   Lab Work: TODAY:  BMET, CBC, & PRO BNP  If you have labs (blood work) drawn today and your tests are completely normal, you will receive your results only by: MyChart Message (if you have MyChart) OR A paper copy in the mail If you have any lab test that is abnormal or we need to change your treatment, we will call you to review the results.   Testing/Procedures: Your physician has requested that you have an echocardiogram. Echocardiography is a painless test that uses sound waves to create images of your heart. It provides your doctor with information about the size and shape of your heart and how well your heart's chambers and valves are working. This procedure takes approximately one hour. There are no restrictions for this procedure. Please do NOT wear cologne, perfume, aftershave, or lotions (deodorant is allowed). Please arrive 15 minutes prior to your appointment time.    Follow-Up: At [facility], you and your health needs are our priority.  As part of our continuing mission to provide you with exceptional heart care, we have created designated Provider Care Teams.  These Care Teams include your primary Cardiologist (physician) and Advanced Practice Providers (APPs -  Physician Assistants and Nurse Practitioners) who all work together to provide you with the care you need, when you need it.  We recommend signing up for the patient portal called "MyChart".  Sign up information is provided on this After Visit Summary.  MyChart is used to connect with patients for Virtual Visits (Telemedicine).  Patients are able to view lab/test results, encounter notes, upcoming appointments, etc.  Non-urgent  messages can be sent to your provider as well.   To learn more about what you can do with MyChart, go to [email or web address].    Your next appointment:   6 month(s)  Provider:   Tonny Bollman, MD  or Tereso Newcomer, PA-C         Other Instructions

## 2023-01-16 LAB — CBC
Hematocrit: 45.3 % (ref 37.5–51.0)
Hemoglobin: 15.8 g/dL (ref 13.0–17.7)
MCH: 32.9 pg (ref 26.6–33.0)
MCHC: 34.9 g/dL (ref 31.5–35.7)
MCV: 94 fL (ref 79–97)
Platelets: 245 10*3/uL (ref 150–450)
RBC: 4.8 x10E6/uL (ref 4.14–5.80)
RDW: 12.9 % (ref 11.6–15.4)
WBC: 10 10*3/uL (ref 3.4–10.8)

## 2023-01-16 LAB — BASIC METABOLIC PANEL
BUN/Creatinine Ratio: 16 (ref 10–24)
BUN: 15 mg/dL (ref 8–27)
CO2: 20 mmol/L (ref 20–29)
Calcium: 9.5 mg/dL (ref 8.6–10.2)
Chloride: 106 mmol/L (ref 96–106)
Creatinine, Ser: 0.96 mg/dL (ref 0.76–1.27)
Glucose: 92 mg/dL (ref 70–99)
Potassium: 4.7 mmol/L (ref 3.5–5.2)
Sodium: 138 mmol/L (ref 134–144)
eGFR: 88 mL/min/{1.73_m2} (ref >59)

## 2023-01-16 LAB — PRO B NATRIURETIC PEPTIDE: NT-Pro BNP: 91 pg/mL (ref 0–210)

## 2023-01-19 ENCOUNTER — Other Ambulatory Visit: Payer: Self-pay

## 2023-01-19 ENCOUNTER — Emergency Department (HOSPITAL_COMMUNITY)
Admission: EM | Admit: 2023-01-19 | Discharge: 2023-01-20 | Discharge status: Against Medical Advice | Payer: PPO | Attending: Emergency Medicine | Admitting: Emergency Medicine

## 2023-01-19 DIAGNOSIS — R34 Anuria and oliguria: Secondary | ICD-10-CM | POA: Insufficient documentation

## 2023-01-19 DIAGNOSIS — R339 Retention of urine, unspecified: Secondary | ICD-10-CM | POA: Diagnosis not present

## 2023-01-19 DIAGNOSIS — Z5321 Procedure and treatment not carried out due to patient leaving prior to being seen by health care provider: Secondary | ICD-10-CM | POA: Diagnosis not present

## 2023-01-19 NOTE — ED Triage Notes (Signed)
Pt arrives c/o urinary retention. States that he last urinated around 1800 tonight, but felt like his bladder was still "tight". Self cathed around 2000 and only a "trickle" of urine came out - states that urine appeared very concentrated. States that he noticed decreased urine output x 36 hours. Denies dysuria. Hx of urinary retention, TURP on 08/07/2022.  Bladder scan shows 0 mL

## 2023-01-20 NOTE — ED Notes (Signed)
Offered to straight cath patient to determine how much urine was in the bladder and perform UA. Pt states that he would rather be discharged and call urology in the AM. Pt encouraged to stay for evaluation and treatment but ultimately decided he wants to leave. No distress noted upon exiting. Pt encouraged to return for worsening symptoms or other concerns. Provider notified.

## 2023-01-21 NOTE — ED Provider Notes (Signed)
Patient presented for decreased urine output concerning for urinary retention. While patient was being triaged, I placed orders for CBC and a BMP to check renal function.  Patient had bladder scan performed in triage.  When it noted minimal retained urine in the bladder, patient decided to elope prior to my evaluation.   Nira Conn, MD 01/21/23 1146

## 2023-02-07 ENCOUNTER — Telehealth (HOSPITAL_COMMUNITY): Payer: Self-pay | Admitting: Physician Assistant

## 2023-02-07 NOTE — Telephone Encounter (Signed)
[address] [place], [place]    02/07/2023 11:52 AM

## 2023-02-07 NOTE — Telephone Encounter (Signed)
Patient called and cancelled echocardiogram for 02/11/23. Patient started feeling better and doesn't think he needs. Patient has a catheter also and just doesnt feel up to having at this time. Order will be removed from Echo WQ.

## 2023-02-12 ENCOUNTER — Ambulatory Visit (HOSPITAL_COMMUNITY): Payer: PPO

## 2023-03-07 ENCOUNTER — Other Ambulatory Visit: Payer: Self-pay | Admitting: Physician Assistant

## 2023-03-27 ENCOUNTER — Emergency Department (HOSPITAL_COMMUNITY)
Admission: EM | Admit: 2023-03-27 | Discharge: 2023-03-27 | Disposition: A | Discharge status: Home / Self Care | Payer: PPO | Attending: Emergency Medicine | Admitting: Emergency Medicine

## 2023-03-27 ENCOUNTER — Other Ambulatory Visit: Payer: Self-pay

## 2023-03-27 ENCOUNTER — Emergency Department (HOSPITAL_COMMUNITY): Payer: PPO

## 2023-03-27 DIAGNOSIS — R1084 Generalized abdominal pain: Secondary | ICD-10-CM | POA: Diagnosis present

## 2023-03-27 DIAGNOSIS — K5900 Constipation, unspecified: Secondary | ICD-10-CM | POA: Diagnosis not present

## 2023-03-27 DIAGNOSIS — Z7982 Long term (current) use of aspirin: Secondary | ICD-10-CM | POA: Insufficient documentation

## 2023-03-27 DIAGNOSIS — R55 Syncope and collapse: Secondary | ICD-10-CM | POA: Insufficient documentation

## 2023-03-27 LAB — COMPREHENSIVE METABOLIC PANEL
ALT: 22 U/L (ref 0–44)
AST: 24 U/L (ref 15–41)
Albumin: 3.9 g/dL (ref 3.5–5.0)
Alkaline Phosphatase: 98 U/L (ref 38–126)
Anion gap: 10 (ref 5–15)
BUN: 8 mg/dL (ref 8–23)
CO2: 21 mmol/L — ABNORMAL LOW (ref 22–32)
Calcium: 9 mg/dL (ref 8.9–10.3)
Chloride: 102 mmol/L (ref 98–111)
Creatinine, Ser: 0.91 mg/dL (ref 0.61–1.24)
GFR, Estimated: >60 mL/min (ref >60)
Glucose, Bld: 123 mg/dL — ABNORMAL HIGH (ref 70–99)
Potassium: 3.2 mmol/L — ABNORMAL LOW (ref 3.5–5.1)
Sodium: 133 mmol/L — ABNORMAL LOW (ref 135–145)
Total Bilirubin: 1.1 mg/dL (ref 0.3–1.2)
Total Protein: 7.3 g/dL (ref 6.5–8.1)

## 2023-03-27 LAB — CBC WITH DIFFERENTIAL/PLATELET
Abs Immature Granulocytes: 0.02 10*3/uL (ref 0.00–0.07)
Basophils Absolute: 0.1 10*3/uL (ref 0.0–0.1)
Basophils Relative: 1 %
Eosinophils Absolute: 0.2 10*3/uL (ref 0.0–0.5)
Eosinophils Relative: 2 %
HCT: 44.2 % (ref 39.0–52.0)
Hemoglobin: 15.6 g/dL (ref 13.0–17.0)
Immature Granulocytes: 0 %
Lymphocytes Relative: 30 %
Lymphs Abs: 2.8 10*3/uL (ref 0.7–4.0)
MCH: 32.4 pg (ref 26.0–34.0)
MCHC: 35.3 g/dL (ref 30.0–36.0)
MCV: 91.7 fL (ref 80.0–100.0)
Monocytes Absolute: 0.8 10*3/uL (ref 0.1–1.0)
Monocytes Relative: 9 %
Neutro Abs: 5.6 10*3/uL (ref 1.7–7.7)
Neutrophils Relative %: 58 %
Platelets: 260 10*3/uL (ref 150–400)
RBC: 4.82 MIL/uL (ref 4.22–5.81)
RDW: 12.2 % (ref 11.5–15.5)
WBC: 9.6 10*3/uL (ref 4.0–10.5)
nRBC: 0 % (ref 0.0–0.2)

## 2023-03-27 LAB — URINALYSIS, ROUTINE W REFLEX MICROSCOPIC
Bilirubin Urine: NEGATIVE
Glucose, UA: NEGATIVE mg/dL
Ketones, ur: NEGATIVE mg/dL
Nitrite: NEGATIVE
Protein, ur: 30 mg/dL — AB
Specific Gravity, Urine: 1.008 (ref 1.005–1.030)
pH: 6 (ref 5.0–8.0)

## 2023-03-27 LAB — CBG MONITORING, ED: Glucose-Capillary: 105 mg/dL — ABNORMAL HIGH (ref 70–99)

## 2023-03-27 LAB — LIPASE, BLOOD: Lipase: 33 U/L (ref 11–51)

## 2023-03-27 MED ORDER — SODIUM CHLORIDE 0.9 % IV SOLN
1.0000 g | Freq: Once | INTRAVENOUS | Status: DC
  Filled 2023-03-27: qty 10

## 2023-03-27 MED ORDER — FLEET ENEMA 7-19 GM/118ML RE ENEM: 1.0000 | ENEMA | Freq: Once | RECTAL | 0 refills | Status: AC

## 2023-03-27 MED ORDER — SODIUM CHLORIDE 0.9 % IV BOLUS
1000.0000 mL | Freq: Once | INTRAVENOUS | Status: AC
  Administered 2023-03-27: 1000 mL via INTRAVENOUS

## 2023-03-27 MED ORDER — IOHEXOL 350 MG/ML SOLN
75.0000 mL | Freq: Once | INTRAVENOUS | Status: AC | PRN
  Administered 2023-03-27: 75 mL via INTRAVENOUS

## 2023-03-27 MED ORDER — METAMUCIL SMOOTH TEXTURE 58.6 % PO POWD: 1.0000 | Freq: Three times a day (TID) | ORAL | 12 refills | Status: AC

## 2023-03-27 NOTE — Discharge Instructions (Addendum)
You have been evaluated for your symptoms.  Fortunately CT scan today did not show any concerning finding.  Your urine shows a possible signs of urinary tract infection.  We have sent a urine culture and if it grows any specific type of bacteria that needs treatment we will contact you with appropriate treatment.  In the meantime, you may use Fleet enema at home to help regulate your bowel movement.  You may take max citrate and Metamucil to help regulate your bowel.  Return to the ER if your symptoms worsen or if you have other concerns.

## 2023-03-27 NOTE — ED Triage Notes (Signed)
Pt.stated, I think I'm going to pass out. Im here for a blockage in my bowels , . I have urinary retention and have had a catheter since the middle of May. Pt is clammy and pale in triage. BP is low

## 2023-03-27 NOTE — ED Notes (Signed)
Ice chips provided for "dry mouth".

## 2023-03-27 NOTE — ED Notes (Signed)
Pt does present with a foley.

## 2023-03-27 NOTE — ED Provider Notes (Signed)
[place] EMERGENCY DEPARTMENT AT [facility] Provider Note   CSN: [id number] Arrival date & time: 03/27/23  1048     History  Chief Complaint  Patient presents with   Constipation   Near Syncope   Weakness    Craig [name] is a 66 y.o. male.  The history is provided by the patient and medical records. No language interpreter was used.  Constipation Near Syncope  Weakness Associated symptoms: near-syncope      67 year old male with history of urinary retention secondary to BPH, AAA, OSA and prediabetes presented to ED with complaints of constipation.  Patient states for the past week he has had generalized fatigue, feeling like he is is constipated, hard to pass stool or at least feels incomplete defecation.  He felt like he may have a bowel obstruction.  He endorsed 1 episode of nausea without vomiting.  He mention he has BPH status post TURP procedure.  Had a Foley placed in May and had it exchanged last week.  His last bowel movement was yesterday.  He is able to pass flatus.  He was initially noted to have low blood pressure and attributed to taking his Rapaflo earlier today.  Denies any fever chills body aches chest pain shortness of breath productive cough.  Does endorse some generalized abdominal cramping.  Home Medications Prior to Admission medications   Medication Sig Start Date End Date Taking? Authorizing Provider  allopurinol (ZYLOPRIM) 100 MG tablet Take 200 mg by mouth daily.    [provider]  ALPRAZolam Prudy Feeler) 0.5 MG tablet Take 0.5 mg by mouth 4 (four) times daily.    [provider]  amitriptyline (ELAVIL) 10 MG tablet Take 10 mg by mouth at bedtime.    [provider]  aspirin EC 81 MG tablet Take 1 tablet (81 mg total) by mouth daily. Swallow whole. Patient not taking: Reported on 01/15/2023 03/27/22   Tereso Newcomer T, PA-C  atenolol (TENORMIN) 25 MG tablet TAKE 1/2 TABLET BY MOUTH ONCE DAILY. 03/07/23   Tereso Newcomer T,  PA-C  cholecalciferol (VITAMIN D3) 25 MCG (1000 UNIT) tablet Take 1,000 Units by mouth daily.    [provider]  colchicine 0.6 MG tablet Take 0.6 mg by mouth daily. As needed for gout flare.    [provider]  Docusate Calcium (STOOL SOFTENER PO) Take 1 tablet by mouth 2 (two) times daily as needed (constipation). Per pt currently taking one at night and sometimes one in the morning    [provider]  rosuvastatin (CRESTOR) 10 MG tablet Take 1 tablet (10 mg total) by mouth daily. Patient not taking: Reported on 01/15/2023 03/27/22   Tereso Newcomer T, PA-C  vitamin B-12 (CYANOCOBALAMIN) 100 MCG tablet Take 100 mcg by mouth daily.    [provider]      Allergies    Metoprolol, Toprol xl [metoprolol succinate], Sulfamethoxazole-trimethoprim, Wheat, Other, Azithromycin, Ciprofloxacin, and Zithromax [azithromycin dihydrate]    Review of Systems   Review of Systems  Cardiovascular:  Positive for near-syncope.  Gastrointestinal:  Positive for constipation.  Neurological:  Positive for weakness.  All other systems reviewed and are negative.   Physical Exam Updated Vital Signs BP 120/74 (BP Location: Right Arm)   Pulse 72   Temp 97.8 F (36.6 C) (Oral)   Resp 18   Ht 5\' 8"  (1.727 m)   Wt 111.1 kg   SpO2 98%   BMI 37.25 kg/m  Physical Exam Vitals and nursing note reviewed.  Constitutional:      General: He is not in acute distress.    Appearance: He is well-developed. He is obese.  HENT:     Head: Atraumatic.  Eyes:     Conjunctiva/sclera: Conjunctivae normal.  Cardiovascular:     Rate and Rhythm: Normal rate and regular rhythm.     Pulses: Normal pulses.     Heart sounds: Normal heart sounds.  Pulmonary:     Effort: Pulmonary effort is normal.     Breath sounds: Normal breath sounds.  Abdominal:     Palpations: Abdomen is soft.     Tenderness: There is abdominal tenderness (Mild generalized abdominal tenderness no guarding or rebound  tenderness no focal point tenderness).  Genitourinary:    Comments: Has indwelling Foley  Chaperone present during exam.  Normal rectal tone no obvious mass no impacted stool in rectal vault normal color stool on glove Musculoskeletal:     Cervical back: Neck supple.  Skin:    Findings: No rash.  Neurological:     Mental Status: He is alert.     ED Results / Procedures / Treatments   Labs (all labs ordered are listed, but only abnormal results are displayed) Labs Reviewed  COMPREHENSIVE METABOLIC PANEL - Abnormal; Notable for the following components:      Result Value   Sodium 133 (*)    Potassium 3.2 (*)    CO2 21 (*)    Glucose, Bld 123 (*)    All other components within normal limits  URINALYSIS, ROUTINE W REFLEX MICROSCOPIC - Abnormal; Notable for the following components:   APPearance HAZY (*)    Hgb urine dipstick SMALL (*)    Protein, ur 30 (*)    Leukocytes,Ua LARGE (*)    Bacteria, UA FEW (*)    All other components within normal limits  CBG MONITORING, ED - Abnormal; Notable for the following components:   Glucose-Capillary 105 (*)    All other components within normal limits  CBC WITH DIFFERENTIAL/PLATELET  LIPASE, BLOOD    EKG None  Radiology CT ABDOMEN PELVIS W CONTRAST  Result Date: 03/27/2023 CLINICAL DATA:  Constipation. EXAM: CT ABDOMEN AND PELVIS WITH CONTRAST TECHNIQUE: Multidetector CT imaging of the abdomen and pelvis was performed using the standard protocol following bolus administration of intravenous contrast. RADIATION DOSE REDUCTION: This exam was performed according to the departmental dose-optimization program which includes automated exposure control, adjustment of the mA and/or kV according to patient size and/or use of iterative reconstruction technique. CONTRAST:  75mL OMNIPAQUE IOHEXOL 350 MG/ML SOLN COMPARISON:  Examination dated April 17, 2021 FINDINGS: Lower chest: No acute abnormality. Hepatobiliary: No focal liver abnormality is  seen. No gallstones, gallbladder wall thickening, or biliary dilatation. Pancreas: Unremarkable. No pancreatic ductal dilatation or surrounding inflammatory changes. Spleen: Normal in size without focal abnormality. Adrenals/Urinary Tract: Adrenal glands are unremarkable. Kidneys are normal, without renal calculi, focal lesion, or hydronephrosis. Foley's catheter in place with contracted urinary bladder. Stomach/Bowel: Stomach is within normal limits. Appendix appears normal. Scattered colonic diverticulosis without evidence of acute diverticulitis. No evidence of bowel wall thickening, distention, or inflammatory changes. Vascular/Lymphatic: No significant vascular findings are present. No enlarged abdominal or pelvic lymph nodes. Reproductive: Prostate is unremarkable. Other: No abdominal wall hernia or abnormality. No abdominopelvic ascites. Musculoskeletal: Multilevel degenerate disc disease of the thoracolumbar spine. Evidence of prior AVN and bilateral femoral heads, unchanged from prior examination of 2022. No acute osseous abnormality. IMPRESSION: 1. No CT evidence of acute abdominal/pelvic process. 2. Scattered  colonic diverticulosis without evidence of acute diverticulitis. No significant stool burden. 3. Foley's catheter in place with contracted urinary bladder. 4. Multilevel degenerate disc disease of the thoracolumbar spine. 5. Evidence of prior AVN of bilateral femoral heads, unchanged from prior examination of 2022. Electronically Signed   By: Larose Hires D.O.   On: 03/27/2023 14:23    Procedures Procedures    Medications Ordered in ED Medications  cefTRIAXone (ROCEPHIN) 1 g in sodium chloride 0.9 % 100 mL IVPB (has no administration in time range)  sodium chloride 0.9 % bolus 1,000 mL (0 mLs Intravenous Stopped 03/27/23 1300)  iohexol (OMNIPAQUE) 350 MG/ML injection 75 mL (75 mLs Intravenous Contrast Given 03/27/23 1358)    ED Course/ Medical Decision Making/ A&P                              Medical Decision Making Amount and/or Complexity of Data Reviewed Labs: ordered. Radiology: ordered.  Risk Prescription drug management.   BP 120/74 (BP Location: Right Arm)   Pulse 72   Temp 97.8 F (36.6 C) (Oral)   Resp 18   Ht 5\' 8"  (1.727 m)   Wt 111.1 kg   SpO2 98%   BMI 37.25 kg/m   65:5 AM 67 year old male with history of urinary retention secondary to BPH, AAA, OSA and prediabetes presented to ED with complaints of constipation.  Patient states for the past week he has had generalized fatigue, feeling like he is is constipated, hard to pass stool or at least feels incomplete defecation.  He felt like he may have a bowel obstruction.  He endorsed 1 episode of nausea without vomiting.  He mention he has BPH status post TURP procedure.  Had a Foley placed in May and had it exchanged last week.  His last bowel movement was yesterday.  He is able to pass flatus.  He was initially noted to have low blood pressure and attributed to taking his Rapaflo earlier today.  Denies any fever chills body aches chest pain shortness of breath productive cough.  Does endorse some generalized abdominal cramping.  On exam obese male laying in bed in no acute discomfort.  Heart with normal rate and rhythm, lungs clear to auscultation bilaterally abdomen with mild generalized tenderness and decreased bowel sounds but bowel sounds is present.  No abdominal surgical scar noted.  Rectal exam under chaperone without any evidence of stool impaction and no blood in the stool.  Patient has normal rectal tone.  He also has an indwelling Foley.  -Labs ordered, independently viewed and interpreted by me.  Labs remarkable for UA shows large leukocyts and 21-50 WBC.  This UA is from his foley bag and has a chance of being colonized.  However due to his constellation of sxs, I felt he would benefit from abx to treat for UTI. -The patient was maintained on a cardiac monitor.  I personally viewed and interpreted  the cardiac monitored which showed an underlying rhythm of: NSR -Imaging independently viewed and interpreted by me and I agree with radiologist's interpretation.  Result remarkable for abd/pelvis CT showing no acute finding -This patient presents to the ED for concern of abdominal discomfort, this involves an extensive number of treatment options, and is a complaint that carries with it a high risk of complications and morbidity.  The differential diagnosis includes constipation, colitis, UTI, pyelonephritis, pancreatitis, appendicitis, SBO -Co morbidities that complicate the patient evaluation includes BPH, HTN,  aneurysm -Treatment includes IVF, rocephin,  -Reevaluation of the patient after these medicines showed that the patient improved -PCP office notes or outside notes reviewed -Escalation to admission/observation considered: patients feels much better, is comfortable with discharge, and will follow up with PCP -Prescription medication considered, patient comfortable with fleet enema, metamucil and mag citrate -Social Determinant of Health considered which includes stress, lack of activity  2:59 PM Workup overall reassuring.  CT scan of the abdomen pelvis without any concerning finding.  UA from Foley with possible UTI however after discussion with patient, plan to send a urine culture and if positive patient can be treated with antibiotic but will hold off on antibiotic at this time.  I felt patient may benefit from Fleet enema, increase fiber in his diet, as well as taking citrate to help with his constipation.  I have also considered cauda equina causing his symptoms but I have low suspicion for that as patient has normal rectal tone, has equal strength to his lower extremities and denies any numbness to his legs.  Time spent discussing plan with the patient and he felt comfortable going home with supportive care.  I gave patient strict return precaution.         Final Clinical  Impression(s) / ED Diagnoses Final diagnoses:  Constipation, unspecified constipation type    Rx / DC Orders ED Discharge Orders          Ordered    sodium phosphate (FLEET) 7-19 GM/118ML ENEM   Once        03/27/23 1508    psyllium (METAMUCIL SMOOTH TEXTURE) 58.6 % powder  3 times daily        03/27/23 1508              Fayrene Helper, PA-C 03/27/23 1510    Cathren Laine, MD 03/28/23 1000

## 2023-03-28 LAB — URINE CULTURE

## 2023-03-29 LAB — URINE CULTURE: Culture: 40000 — AB

## 2023-03-30 ENCOUNTER — Telehealth (HOSPITAL_BASED_OUTPATIENT_CLINIC_OR_DEPARTMENT_OTHER): Payer: Self-pay | Admitting: *Deleted

## 2023-03-30 NOTE — Progress Notes (Signed)
ED Antimicrobial Stewardship Positive Culture Follow Up   Craig [name] is an 67 y.o. male who presented to [facility] on 03/27/2023 with a chief complaint of  Chief Complaint  Patient presents with   Constipation   Near Syncope   Weakness    Recent Results (from the past 720 hour(s))  Urine Culture     Status: Abnormal   Collection Time: 03/27/23  3:01 PM   Specimen: Urine, Catheterized  Result Value Ref Range Status   Specimen Description URINE, CATHETERIZED  Final   Special Requests   Final    NONE Performed at [facility], [address], [place], [place] [address]    Culture 40,000 COLONIES/mL STAPHYLOCOCCUS EPIDERMIDIS (A)  Final   Report Status 03/29/2023 FINAL  Final   Organism ID, Bacteria STAPHYLOCOCCUS EPIDERMIDIS (A)  Final      Susceptibility   Staphylococcus epidermidis - MIC*    CIPROFLOXACIN <=0.5 SENSITIVE Sensitive     GENTAMICIN <=0.5 SENSITIVE Sensitive     NITROFURANTOIN <=16 SENSITIVE Sensitive     OXACILLIN >=4 RESISTANT Resistant     TETRACYCLINE >=16 RESISTANT Resistant     VANCOMYCIN 2 SENSITIVE Sensitive     TRIMETH/SULFA <=10 SENSITIVE Sensitive     CLINDAMYCIN <=0.25 SENSITIVE Sensitive     RIFAMPIN <=0.5 SENSITIVE Sensitive     Inducible Clindamycin NEGATIVE Sensitive     * 40,000 COLONIES/mL STAPHYLOCOCCUS EPIDERMIDIS    [x]  Urine culture findings significant for staph epi (common skin flora) (40K CFUs). Would consider this a non-clinically significant finding.  EDP recommends following up with urology or PCP to see if re-culturing urine is warranted.   ED Provider: Ysidro Evert PA   Delmar Landau, PharmD, BCPS 03/30/2023 10:18 AM ED Clinical Pharmacist -  [phone]

## 2023-03-30 NOTE — Telephone Encounter (Signed)
Post ED Visit - Positive Culture Follow-up  Culture report reviewed by antimicrobial stewardship pharmacist: [facility] []  Enzo Bi, Pharm.D. []  Celedonio Miyamoto, [address].D., BCPS AQ-ID []  Garvin Fila, Pharm.D., BCPS []  Georgina Pillion, Pharm.D., BCPS []  [place], [place].D., BCPS, AAHIVP []  Estella Husk, Pharm.D., BCPS, AAHIVP []  Lysle Pearl, PharmD, BCPS []  Phillips Climes, PharmD, BCPS []  Agapito Games, PharmD, BCPS []  Verlan Friends, PharmD []  Mervyn Gay, PharmD, BCPS [x]  Delmar Landau, PharmD  [facility] []  Len Childs, PharmD []  Greer Pickerel, PharmD []  Adalberto Cole, PharmD []  Perlie Gold, Rph []  Lonell Face) Jean Rosenthal, PharmD []  Earl Many, PharmD []  Junita Push, PharmD []  Dorna Leitz, PharmD []  Terrilee Files, PharmD []  Lynann Beaver, PharmD []  Keturah Barre, PharmD []  Loralee Pacas, PharmD []  Bernadene Person, PharmD   Positive urine culture Likely contamination.Plan was to have pt follow up with PCP or urology to see if getting new culture warrented per Ysidro Evert, PA-C . Spoke to pt and he stated he called his urologist yesterday and urologist felt no treatment was needed at this time.   Patsey Berthold 03/30/2023, 12:17 PM

## 2023-04-03 ENCOUNTER — Telehealth: Payer: Self-pay

## 2023-04-03 NOTE — Telephone Encounter (Signed)
Transition Care Management Follow-up Telephone Call Date of discharge and from where: 03/27/2023 [facility] How have you been since you were released from the hospital? Patient is feeling better. Any questions or concerns? No  Items Reviewed: Did the pt receive and understand the discharge instructions provided? Yes  Medications obtained and verified?  No medication prescribed. Other? No  Any new allergies since your discharge? No  Dietary orders reviewed? Yes Do you have support at home? Yes   Follow up appointments reviewed:  PCP Hospital f/u appt confirmed? No  Scheduled to see  on  @ . Specialist Hospital f/u appt confirmed? No  Scheduled to see  on  @ . Are transportation arrangements needed? No  If their condition worsens, is the pt aware to call PCP or go to the Emergency Dept.? Yes Was the patient provided with contact information for the PCP's office or ED? Yes Was to pt encouraged to call back with questions or concerns? Yes  Billee Balcerzak [facility]  [facility] Population Health Community Resource Care Guide   [email or web address]  ?? [phone]   Website: [email or web address]  [email or web address]

## 2023-05-23 ENCOUNTER — Telehealth: Payer: Self-pay | Admitting: Cardiovascular Disease

## 2023-05-23 NOTE — Telephone Encounter (Signed)
Patient c/o Palpitations:  STAT if patient reporting lightheadedness, shortness of breath, or chest pain  How long have you had palpitations/irregular HR/ Afib? Are you having the symptoms now? Has 15 sec arrhythmia occurrence around this weekend or beginning of this week   Are you currently experiencing lightheadedness, SOB or CP? No   Do you have a history of afib (atrial fibrillation) or irregular heart rhythm? Yes   Have you checked your BP or HR? (document readings if available): No   Are you experiencing any other symptoms? Had PVC's after  Called patient to reschedule 11/11 appointment with Dr. Excell Seltzer due to schedule change. Patient reported symptoms upon call.   Patient believes symptoms occurred due to weight gain and not being mobile at this time due to surgery error and having a catheter placed since May.   He reports arrhythmia is normally only 4 to 5 seconds verses the 15 seconds that occurred.    Has not had symptoms since occurrence.    Patient is also concerned with taking 1/2 tablet of atenolol being inconsistent due to tablet not breaking the same every time.   Please advise.

## 2023-05-26 NOTE — Telephone Encounter (Signed)
Returned call to patient and left message.

## 2023-05-27 NOTE — Telephone Encounter (Signed)
Returned call to patient who states that the incident was 2 weeks ago. Says he's familiar with what PVC's feel like, states this was an episode of something that felt different that he feels lasted 12-15 seconds. Felt himself return to normal rhythm. Recent prostate surgery and currently has indwelling foley cath. He condones drinking "a lot of extra water trying to keep myself from getting an infection." States he's sitting in a recliner most of the time during the days since May of this year, eating poorly, not exercising and estimates that he's gained about 12 pounds. Denies SOB, heavy sweating, flushing, or any other sensations associated with the episode of irregular heart rhythm. States nothing like this since, other than a few PVC's here and there. Educated on [place] mobile and smart watch EKG tracings to try and catch a reading should this happen again. Also recommended he have labs done to check his electrolytes, but he states he feels this is due largely to stress and that coming out of his house with having to wear a leg bag that's overnight size, is too much of an inconvenience for him. Was on phone for a VERY lengthy conversation. He is going to try to work on increasing exercise and reducing stress. No real concern with his arrhythmia.

## 2023-07-18 ENCOUNTER — Ambulatory Visit: Payer: PPO | Admitting: Cardiovascular Disease

## 2023-07-21 ENCOUNTER — Ambulatory Visit: Payer: PPO | Admitting: Cardiovascular Disease

## 2023-08-19 ENCOUNTER — Emergency Department (HOSPITAL_COMMUNITY)
Admission: EM | Admit: 2023-08-19 | Discharge: 2023-08-19 | Disposition: A | Discharge status: Home / Self Care | Payer: PPO | Attending: Emergency Medicine | Admitting: Emergency Medicine

## 2023-08-19 ENCOUNTER — Encounter (HOSPITAL_COMMUNITY): Payer: Self-pay | Admitting: *Deleted

## 2023-08-19 ENCOUNTER — Other Ambulatory Visit: Payer: Self-pay

## 2023-08-19 ENCOUNTER — Emergency Department (HOSPITAL_COMMUNITY): Payer: PPO

## 2023-08-19 DIAGNOSIS — Z7982 Long term (current) use of aspirin: Secondary | ICD-10-CM | POA: Insufficient documentation

## 2023-08-19 DIAGNOSIS — N50811 Right testicular pain: Secondary | ICD-10-CM | POA: Diagnosis present

## 2023-08-19 DIAGNOSIS — I861 Scrotal varices: Secondary | ICD-10-CM | POA: Insufficient documentation

## 2023-08-19 DIAGNOSIS — N453 Epididymo-orchitis: Secondary | ICD-10-CM | POA: Diagnosis not present

## 2023-08-19 DIAGNOSIS — I10 Essential (primary) hypertension: Secondary | ICD-10-CM | POA: Diagnosis not present

## 2023-08-19 DIAGNOSIS — Z79899 Other long term (current) drug therapy: Secondary | ICD-10-CM | POA: Diagnosis not present

## 2023-08-19 DIAGNOSIS — N433 Hydrocele, unspecified: Secondary | ICD-10-CM | POA: Insufficient documentation

## 2023-08-19 LAB — URINALYSIS, ROUTINE W REFLEX MICROSCOPIC
Bilirubin Urine: NEGATIVE
Glucose, UA: NEGATIVE mg/dL
Ketones, ur: NEGATIVE mg/dL
Nitrite: POSITIVE — AB
Protein, ur: NEGATIVE mg/dL
Specific Gravity, Urine: 1.005 (ref 1.005–1.030)
pH: 7 (ref 5.0–8.0)

## 2023-08-19 MED ORDER — NYSTATIN 100000 UNIT/GM EX POWD: 1.0000 | Freq: Two times a day (BID) | CUTANEOUS | 0 refills | Status: DC

## 2023-08-19 MED ORDER — LEVOFLOXACIN 500 MG PO TABS: 500.0000 mg | ORAL_TABLET | Freq: Every day | ORAL | 0 refills | Status: DC

## 2023-08-19 MED ORDER — NYSTATIN 100000 UNIT/GM EX POWD: 1.0000 | Freq: Three times a day (TID) | CUTANEOUS | 0 refills | Status: DC

## 2023-08-19 MED ORDER — FLUCONAZOLE 150 MG PO TABS: 150.0000 mg | ORAL_TABLET | Freq: Every day | ORAL | 0 refills | Status: DC

## 2023-08-19 NOTE — ED Triage Notes (Signed)
Pt noticed pain and swelling last week with his right testicle that has gotten worse   Pt states he has pain when he is sitting or touches the area

## 2023-08-19 NOTE — ED Notes (Signed)
Ultrasound in progress  

## 2023-08-19 NOTE — Discharge Instructions (Addendum)
As discussed, you have swelling of your right sided epididymis which is most likely causing your symptoms.  Given the appearance of your urine, there is concern for infection.  Will treat this with antibiotics.  Recommend follow-up with urologist in the outpatient setting for reevaluation of your symptoms.  Please do not hesitate to return if the worrisome signs and symptoms we discussed become apparent.

## 2023-08-19 NOTE — ED Provider Notes (Signed)
[place] EMERGENCY DEPARTMENT AT [facility] Provider Note   CSN: [id number] Arrival date & time: 08/19/23  2952     History  Chief Complaint  Patient presents with   Groin Swelling    Craig [name] is a 67 y.o. male.  HPI   67 year old male presents emergency department with complaints of right-sided testicular pain/swelling.  States that he has been having symptoms for the past week.  States his symptoms are worsened when he strains as well as with certain movements.  States that he feels like his right testicle is slightly elevated compared to his left.  Patient with indwelling Foley catheter due to urinary retention since around middle of the year.  States that he has noticed "cotton like" discharge in catheter bag.  Patient states that he is not concerned about STDs given a monogamous relationship with his wife.  Denies any fever, abdominal pain, flank pain, changes in bowel movements.  Past medical history significant for BPH, hyperlipidemia, hypertension, gout, OSA, urinary retention, or atherosclerosis  Home Medications Prior to Admission medications   Medication Sig Start Date End Date Taking? Authorizing Provider  levofloxacin (LEVAQUIN) 500 MG tablet Take 1 tablet (500 mg total) by mouth daily. 08/19/23  Yes Sherian Maroon A, PA  allopurinol (ZYLOPRIM) 100 MG tablet Take 200 mg by mouth daily.    [provider]  ALPRAZolam Prudy Feeler) 0.5 MG tablet Take 0.5 mg by mouth 4 (four) times daily.    [provider]  amitriptyline (ELAVIL) 10 MG tablet Take 10 mg by mouth at bedtime.    [provider]  aspirin EC 81 MG tablet Take 1 tablet (81 mg total) by mouth daily. Swallow whole. Patient not taking: Reported on 01/15/2023 03/27/22   Tereso Newcomer T, PA-C  atenolol (TENORMIN) 25 MG tablet TAKE 1/2 TABLET BY MOUTH ONCE DAILY. 03/07/23   Tereso Newcomer T, PA-C  cholecalciferol (VITAMIN D3) 25 MCG (1000 UNIT) tablet Take 1,000 Units by mouth  daily.    [provider]  colchicine 0.6 MG tablet Take 0.6 mg by mouth daily. As needed for gout flare.    [provider]  Docusate Calcium (STOOL SOFTENER PO) Take 1 tablet by mouth 2 (two) times daily as needed (constipation). Per pt currently taking one at night and sometimes one in the morning    [provider]  nystatin (MYCOSTATIN/NYSTOP) powder Apply 1 Application topically 2 (two) times daily. 08/19/23   Sherian Maroon A, PA  psyllium (METAMUCIL SMOOTH TEXTURE) 58.6 % powder Take 1 packet by mouth 3 (three) times daily. 03/27/23   Fayrene Helper, PA-C  rosuvastatin (CRESTOR) 10 MG tablet Take 1 tablet (10 mg total) by mouth daily. Patient not taking: Reported on 01/15/2023 03/27/22   Tereso Newcomer T, PA-C  vitamin B-12 (CYANOCOBALAMIN) 100 MCG tablet Take 100 mcg by mouth daily.    [provider]      Allergies    Metoprolol, Toprol xl [metoprolol succinate], Sulfamethoxazole-trimethoprim, Wheat, Other, Azithromycin, Ciprofloxacin, and Zithromax [azithromycin dihydrate]    Review of Systems   Review of Systems  All other systems reviewed and are negative.   Physical Exam Updated Vital Signs BP 118/70   Pulse 68   Temp 98.6 F (37 C) (Oral)   Resp 16   Ht 5' 8.5" (1.74 m)   Wt 113.4 kg   SpO2 95%   BMI 37.46 kg/m  Physical Exam Vitals and nursing note reviewed.  Constitutional:      General:  He is not in acute distress.    Appearance: He is well-developed.  HENT:     Head: Normocephalic and atraumatic.  Eyes:     Conjunctiva/sclera: Conjunctivae normal.  Cardiovascular:     Rate and Rhythm: Normal rate and regular rhythm.  Pulmonary:     Effort: Pulmonary effort is normal. No respiratory distress.     Breath sounds: Normal breath sounds.  Abdominal:     Palpations: Abdomen is soft.     Tenderness: There is no abdominal tenderness.  Genitourinary:    Penis: Normal.      Comments: Cremasteric reflex intact bilaterally.   Tenderness of right sided epididymis.  Scrotum without obvious erythema, palpable fluctuance/induration.  No obvious inguinal hernia appreciated with Valsalva.  Vertical lying testicle bilaterally.. Musculoskeletal:        General: No swelling.     Cervical back: Neck supple.  Skin:    General: Skin is warm and dry.     Capillary Refill: Capillary refill takes less than 2 seconds.  Neurological:     Mental Status: He is alert.  Psychiatric:        Mood and Affect: Mood normal.     ED Results / Procedures / Treatments   Labs (all labs ordered are listed, but only abnormal results are displayed) Labs Reviewed  URINALYSIS, ROUTINE W REFLEX MICROSCOPIC - Abnormal; Notable for the following components:      Result Value   Hgb urine dipstick SMALL (*)    Nitrite POSITIVE (*)    Leukocytes,Ua LARGE (*)    Bacteria, UA RARE (*)    All other components within normal limits  URINE CULTURE  GC/CHLAMYDIA PROBE AMP ([place]) NOT AT [facility]    EKG None  Radiology US SCROTUM W/DOPPLER  Result Date: 08/19/2023 CLINICAL DATA:  Right scrotal pain and swelling for the past week. EXAM: SCROTAL ULTRASOUND DOPPLER ULTRASOUND OF THE TESTICLES TECHNIQUE: Complete ultrasound examination of the testicles, epididymis, and other scrotal structures was performed. Color and spectral Doppler ultrasound were also utilized to evaluate blood flow to the testicles. COMPARISON:  CT abdomen pelvis dated March 27, 2023. FINDINGS: Right testicle Measurements: 4.4 x 1.9 x 2.6 cm. Increased vascularity. No mass or microlithiasis visualized. 7 mm scrotolith noted. Left testicle Measurements: 4.0 x 2.2 x 3.2 cm. No mass or microlithiasis visualized. Right epididymis: Enlarged with increased vascularity. 6 mm epididymal head cyst. Left epididymis: Normal in size and appearance. Two epididymal cysts measuring up to 1 cm. Hydrocele:  Small right and trace left hydroceles. Varicocele:  Left varicocele. Pulsed Doppler  interrogation of both testes demonstrates normal low resistance arterial and venous waveforms bilaterally. IMPRESSION: 1. Right epididymo-orchitis. 2. Small right and trace left hydroceles. 3. Left varicocele. Electronically Signed   By: Obie Dredge M.D.   On: 08/19/2023 12:24    Procedures Procedures    Medications Ordered in ED Medications - No data to display  ED Course/ Medical Decision Making/ A&P                                 Medical Decision Making Amount and/or Complexity of Data Reviewed Labs: ordered. Radiology: ordered.   This patient presents to the ED for concern of testicle pain, this involves an extensive number of treatment options, and is a complaint that carries with it a high risk of complications and morbidity.  The differential diagnosis includes epididymitis, hydrocele, inguinal hernia, cellulitis, abscess,  testicular torsion, other   Co morbidities that complicate the patient evaluation  See HPI   Additional history obtained:  Additional history obtained from EMR External records from outside source obtained and reviewed including hospital records   Lab Tests:  I Ordered, and personally interpreted labs.  The pertinent results include: UA concerning for infection with rare bacteria, large leukocytes, positive nitrites and 11-20 WBCs   Imaging Studies ordered:  I ordered imaging studies including testicular ultrasound I independently visualized and interpreted imaging which showed right-sided epididymal orchitis.  Varicocele.  Hydrocele I agree with the radiologist interpretation   Cardiac Monitoring: / EKG:  The patient was maintained on a cardiac monitor.  I personally viewed and interpreted the cardiac monitored which showed an underlying rhythm of: Sinus   Consultations Obtained:  See ED course  Problem List / ED Course / Critical interventions / Medication management  Right testicle pain Reevaluation of the patientshowed that  the patient stayed the same I have reviewed the patients home medicines and have made adjustments as needed   Social Determinants of Health:  Tobacco, illicit drug use   Test / Admission - Considered:  Right testicle pain Vitals signs within normal range and stable throughout visit. Laboratory/imaging studies significant for: See above 67 year old male presents emergency department with complaints of right testicle pain symptoms present for the past week.  On exam, patient with tenderness and swelling right-sided epididymis.  UA concerning for infection.  Ultrasound concerning for epididymoorchitis.  Patient in monogamous relationship; low suspicion for STD etiology.  Will treat with Levaquin and recommend symptomatic therapy as described in AVS.  Close follow-up with PCP/urology recommended for reevaluation.  Will culture urine and assess bacterial pathogen from prior urine cultures, susceptibility to fluoroquinolones.  Treatment plan discussed at length with patient and he acknowledged understanding was agreeable to said plan.  Patient overall well-appearing, afebrile in no acute distress. Worrisome signs and symptoms were discussed with the patient, and the patient acknowledged understanding to return to the ED if noticed. Patient was stable upon discharge.          Final Clinical Impression(s) / ED Diagnoses Final diagnoses:  Epididymoorchitis  Hydrocele, unspecified hydrocele type  Varicocele  Pain in right testicle    Rx / DC Orders ED Discharge Orders     None         Peter Garter, [place] 08/19/23 1317    Loetta Rough, MD 08/19/23 1501

## 2023-08-20 LAB — GC/CHLAMYDIA PROBE AMP (~~LOC~~) NOT AT ARMC
Chlamydia: NEGATIVE
Comment: NEGATIVE
Comment: NORMAL
Neisseria Gonorrhea: NEGATIVE

## 2023-08-21 LAB — URINE CULTURE: Culture: >=100000 — AB

## 2023-08-22 ENCOUNTER — Telehealth (HOSPITAL_BASED_OUTPATIENT_CLINIC_OR_DEPARTMENT_OTHER): Payer: Self-pay | Admitting: *Deleted

## 2023-08-22 NOTE — Telephone Encounter (Signed)
Post ED Visit - Positive Culture Follow-up  Culture report reviewed by antimicrobial stewardship pharmacist: [facility] [x]  Ivery Quale, [place].D. []  Celedonio Miyamoto, Pharm.D., BCPS AQ-ID []  Garvin Fila, Pharm.D., BCPS []  Georgina Pillion, Pharm.D., BCPS []  [place], [place].D., BCPS, AAHIVP []  Estella Husk, Pharm.D., BCPS, AAHIVP []  Lysle Pearl, PharmD, BCPS []  Phillips Climes, PharmD, BCPS []  Agapito Games, PharmD, BCPS []  Verlan Friends, PharmD []  Mervyn Gay, PharmD, BCPS []  Vinnie Level, PharmD  [facility] []  Len Childs, PharmD []  Greer Pickerel, PharmD []  Adalberto Cole, PharmD []  Perlie Gold, Rph []  Lonell Face) Jean Rosenthal, PharmD []  Earl Many, PharmD []  Junita Push, PharmD []  Dorna Leitz, PharmD []  Terrilee Files, PharmD []  Lynann Beaver, PharmD []  Keturah Barre, PharmD []  Loralee Pacas, PharmD []  Bernadene Person, PharmD   Positive urine culture Treated with Levofloxacin, organism sensitive to the same and no further patient follow-up is required at this time.  Virl Axe [facility] 08/22/2023, 8:00 AM

## 2024-01-13 ENCOUNTER — Telehealth: Payer: Self-pay | Admitting: *Deleted

## 2024-01-13 NOTE — Telephone Encounter (Signed)
 Received call from patient [phone]~ telephone.   Patient reports that he has cyst to left axilla that has been in place >1 year. States that over weekend, cyst has grown and become very tender to touch. States that he would like appointment with Dr. Collene Dawson.   States that he attempted to schedule appointment with [facility], but could not be seen until Monday, 01/19/2024.  Advised that if patient needs immediate attention, he should contact PCP or go to UC/ ER. Advised that outpatient surgery is not appropriate for urgent needs.   Verbalized understanding. States that he will contact PCP.

## 2024-01-15 ENCOUNTER — Encounter (HOSPITAL_COMMUNITY)
Admission: AD | Disposition: A | Discharge status: Home / Self Care | Payer: Self-pay | Source: Ambulatory Visit | Attending: Internal Medicine

## 2024-01-15 ENCOUNTER — Other Ambulatory Visit: Payer: Self-pay

## 2024-01-15 ENCOUNTER — Ambulatory Visit (HOSPITAL_COMMUNITY): Admitting: Anesthesiology

## 2024-01-15 ENCOUNTER — Encounter (HOSPITAL_COMMUNITY): Payer: Self-pay | Admitting: General Surgery

## 2024-01-15 ENCOUNTER — Inpatient Hospital Stay (HOSPITAL_COMMUNITY)
Admission: AD | Admit: 2024-01-15 | Discharge: 2024-01-17 | DRG: 603 | Disposition: A | Discharge status: Home / Self Care | Source: Ambulatory Visit | Attending: Internal Medicine | Admitting: Internal Medicine

## 2024-01-15 DIAGNOSIS — F419 Anxiety disorder, unspecified: Secondary | ICD-10-CM | POA: Diagnosis present

## 2024-01-15 DIAGNOSIS — L02419 Cutaneous abscess of limb, unspecified: Principal | ICD-10-CM | POA: Diagnosis present

## 2024-01-15 DIAGNOSIS — Z888 Allergy status to other drugs, medicaments and biological substances status: Secondary | ICD-10-CM

## 2024-01-15 DIAGNOSIS — Z8249 Family history of ischemic heart disease and other diseases of the circulatory system: Secondary | ICD-10-CM

## 2024-01-15 DIAGNOSIS — I700 Atherosclerosis of aorta: Principal | ICD-10-CM

## 2024-01-15 DIAGNOSIS — L02412 Cutaneous abscess of left axilla: Secondary | ICD-10-CM | POA: Diagnosis not present

## 2024-01-15 DIAGNOSIS — I10 Essential (primary) hypertension: Secondary | ICD-10-CM

## 2024-01-15 DIAGNOSIS — E66812 Obesity, class 2: Secondary | ICD-10-CM | POA: Diagnosis present

## 2024-01-15 DIAGNOSIS — Z6838 Body mass index (BMI) 38.0-38.9, adult: Secondary | ICD-10-CM

## 2024-01-15 DIAGNOSIS — N138 Other obstructive and reflux uropathy: Secondary | ICD-10-CM | POA: Diagnosis present

## 2024-01-15 DIAGNOSIS — L03114 Cellulitis of left upper limb: Secondary | ICD-10-CM | POA: Diagnosis present

## 2024-01-15 DIAGNOSIS — E119 Type 2 diabetes mellitus without complications: Secondary | ICD-10-CM | POA: Diagnosis not present

## 2024-01-15 DIAGNOSIS — Z91018 Allergy to other foods: Secondary | ICD-10-CM

## 2024-01-15 DIAGNOSIS — J45909 Unspecified asthma, uncomplicated: Secondary | ICD-10-CM | POA: Diagnosis not present

## 2024-01-15 DIAGNOSIS — M1A00X0 Idiopathic chronic gout, unspecified site, without tophus (tophi): Secondary | ICD-10-CM | POA: Diagnosis present

## 2024-01-15 DIAGNOSIS — M109 Gout, unspecified: Secondary | ICD-10-CM | POA: Diagnosis present

## 2024-01-15 DIAGNOSIS — G4733 Obstructive sleep apnea (adult) (pediatric): Secondary | ICD-10-CM | POA: Diagnosis present

## 2024-01-15 DIAGNOSIS — B950 Streptococcus, group A, as the cause of diseases classified elsewhere: Secondary | ICD-10-CM | POA: Diagnosis present

## 2024-01-15 DIAGNOSIS — Z881 Allergy status to other antibiotic agents status: Secondary | ICD-10-CM

## 2024-01-15 DIAGNOSIS — Z9079 Acquired absence of other genital organ(s): Secondary | ICD-10-CM

## 2024-01-15 DIAGNOSIS — R339 Retention of urine, unspecified: Secondary | ICD-10-CM | POA: Diagnosis present

## 2024-01-15 DIAGNOSIS — Z87891 Personal history of nicotine dependence: Secondary | ICD-10-CM

## 2024-01-15 DIAGNOSIS — Z7982 Long term (current) use of aspirin: Secondary | ICD-10-CM

## 2024-01-15 DIAGNOSIS — Z882 Allergy status to sulfonamides status: Secondary | ICD-10-CM

## 2024-01-15 DIAGNOSIS — N401 Enlarged prostate with lower urinary tract symptoms: Secondary | ICD-10-CM | POA: Diagnosis present

## 2024-01-15 DIAGNOSIS — Z79899 Other long term (current) drug therapy: Secondary | ICD-10-CM

## 2024-01-15 DIAGNOSIS — E785 Hyperlipidemia, unspecified: Secondary | ICD-10-CM | POA: Diagnosis present

## 2024-01-15 HISTORY — PX: IRRIGATION AND DEBRIDEMENT ABSCESS: SHX5252

## 2024-01-15 LAB — BASIC METABOLIC PANEL WITH GFR
Anion gap: 6 (ref 5–15)
BUN: 11 mg/dL (ref 8–23)
CO2: 25 mmol/L (ref 22–32)
Calcium: 8.9 mg/dL (ref 8.9–10.3)
Chloride: 106 mmol/L (ref 98–111)
Creatinine, Ser: 0.8 mg/dL (ref 0.61–1.24)
GFR, Estimated: >60 mL/min (ref >60)
Glucose, Bld: 115 mg/dL — ABNORMAL HIGH (ref 70–99)
Potassium: 4.3 mmol/L (ref 3.5–5.1)
Sodium: 137 mmol/L (ref 135–145)

## 2024-01-15 LAB — CBC
HCT: 44.4 % (ref 39.0–52.0)
Hemoglobin: 15.2 g/dL (ref 13.0–17.0)
MCH: 33 pg (ref 26.0–34.0)
MCHC: 34.2 g/dL (ref 30.0–36.0)
MCV: 96.3 fL (ref 80.0–100.0)
Platelets: 233 10*3/uL (ref 150–400)
RBC: 4.61 MIL/uL (ref 4.22–5.81)
RDW: 13 % (ref 11.5–15.5)
WBC: 10.5 10*3/uL (ref 4.0–10.5)
nRBC: 0 % (ref 0.0–0.2)

## 2024-01-15 LAB — GLUCOSE, CAPILLARY
Glucose-Capillary: 186 mg/dL — ABNORMAL HIGH (ref 70–99)
Glucose-Capillary: 154 mg/dL — ABNORMAL HIGH (ref 70–99)
Glucose-Capillary: 187 mg/dL — ABNORMAL HIGH (ref 70–99)

## 2024-01-15 LAB — HIV ANTIBODY (ROUTINE TESTING W REFLEX): HIV Screen 4th Generation wRfx: NONREACTIVE

## 2024-01-15 SURGERY — IRRIGATION AND DEBRIDEMENT ABSCESS
Anesthesia: Monitor Anesthesia Care | Laterality: Left

## 2024-01-15 MED ORDER — ACETAMINOPHEN 10 MG/ML IV SOLN: 1000.0000 mg | Freq: Once | INTRAVENOUS | Status: DC | PRN

## 2024-01-15 MED ORDER — CHLORHEXIDINE GLUCONATE 0.12 % MT SOLN
15.0000 mL | Freq: Once | OROMUCOSAL | Status: AC
  Administered 2024-01-15: 15 mL via OROMUCOSAL

## 2024-01-15 MED ORDER — FENTANYL CITRATE (PF) 100 MCG/2ML IJ SOLN
INTRAMUSCULAR | Status: DC | PRN
  Administered 2024-01-15: 50 ug via INTRAVENOUS

## 2024-01-15 MED ORDER — ONDANSETRON HCL 4 MG/2ML IJ SOLN
INTRAMUSCULAR | Status: DC | PRN
  Administered 2024-01-15: 4 mg via INTRAVENOUS

## 2024-01-15 MED ORDER — ALPRAZOLAM 0.5 MG PO TABS
0.5000 mg | ORAL_TABLET | Freq: Three times a day (TID) | ORAL | Status: DC
  Administered 2024-01-15 – 2024-01-17 (×5): 0.5 mg via ORAL
  Filled 2024-01-15 (×6): qty 1

## 2024-01-15 MED ORDER — OXYCODONE HCL 5 MG PO TABS: 5.0000 mg | ORAL_TABLET | Freq: Once | ORAL | Status: DC | PRN

## 2024-01-15 MED ORDER — OXYCODONE HCL 5 MG PO TABS: 5.0000 mg | ORAL_TABLET | ORAL | Status: DC | PRN

## 2024-01-15 MED ORDER — LIDOCAINE HCL (CARDIAC) PF 100 MG/5ML IV SOSY
PREFILLED_SYRINGE | INTRAVENOUS | Status: DC | PRN
Start: 2024-01-15 — End: 2024-01-15
  Administered 2024-01-15: 60 mg via INTRAVENOUS

## 2024-01-15 MED ORDER — AMITRIPTYLINE HCL 10 MG PO TABS
10.0000 mg | ORAL_TABLET | Freq: Every day | ORAL | Status: DC
  Administered 2024-01-15 – 2024-01-16 (×2): 10 mg via ORAL
  Filled 2024-01-15 (×2): qty 1

## 2024-01-15 MED ORDER — ACETAMINOPHEN 650 MG RE SUPP: 650.0000 mg | Freq: Four times a day (QID) | RECTAL | Status: DC | PRN

## 2024-01-15 MED ORDER — ONDANSETRON HCL 4 MG/2ML IJ SOLN: 4.0000 mg | Freq: Once | INTRAMUSCULAR | Status: DC | PRN

## 2024-01-15 MED ORDER — PROPOFOL 10 MG/ML IV BOLUS
INTRAVENOUS | Status: AC
  Filled 2024-01-15: qty 20

## 2024-01-15 MED ORDER — OXYCODONE HCL 5 MG/5ML PO SOLN: 5.0000 mg | Freq: Once | ORAL | Status: DC | PRN

## 2024-01-15 MED ORDER — INSULIN ASPART 100 UNIT/ML IJ SOLN: 0.0000 [IU] | Freq: Every day | INTRAMUSCULAR | Status: DC

## 2024-01-15 MED ORDER — ALBUTEROL SULFATE (2.5 MG/3ML) 0.083% IN NEBU: 2.5000 mg | INHALATION_SOLUTION | RESPIRATORY_TRACT | Status: DC | PRN

## 2024-01-15 MED ORDER — CHLORHEXIDINE GLUCONATE CLOTH 2 % EX PADS: 6.0000 | MEDICATED_PAD | Freq: Once | CUTANEOUS | Status: DC

## 2024-01-15 MED ORDER — BUPIVACAINE-EPINEPHRINE (PF) 0.25% -1:200000 IJ SOLN
INTRAMUSCULAR | Status: AC
  Filled 2024-01-15: qty 30

## 2024-01-15 MED ORDER — INSULIN ASPART 100 UNIT/ML IJ SOLN: 0.0000 [IU] | Freq: Three times a day (TID) | INTRAMUSCULAR | Status: DC

## 2024-01-15 MED ORDER — METHOCARBAMOL 500 MG PO TABS
500.0000 mg | ORAL_TABLET | Freq: Four times a day (QID) | ORAL | Status: DC | PRN
  Administered 2024-01-17: 500 mg via ORAL
  Filled 2024-01-15: qty 1

## 2024-01-15 MED ORDER — ENOXAPARIN SODIUM 40 MG/0.4ML IJ SOSY
40.0000 mg | PREFILLED_SYRINGE | INTRAMUSCULAR | Status: DC
  Administered 2024-01-15 – 2024-01-16 (×2): 40 mg via SUBCUTANEOUS
  Filled 2024-01-15 (×2): qty 0.4

## 2024-01-15 MED ORDER — MORPHINE SULFATE (PF) 2 MG/ML IV SOLN: 2.0000 mg | INTRAVENOUS | Status: DC | PRN

## 2024-01-15 MED ORDER — MIDAZOLAM HCL 2 MG/2ML IJ SOLN
INTRAMUSCULAR | Status: AC
  Filled 2024-01-15: qty 2

## 2024-01-15 MED ORDER — DEXMEDETOMIDINE HCL IN NACL 80 MCG/20ML IV SOLN
INTRAVENOUS | Status: DC | PRN
  Administered 2024-01-15: 8 ug via INTRAVENOUS

## 2024-01-15 MED ORDER — FENTANYL CITRATE (PF) 100 MCG/2ML IJ SOLN
INTRAMUSCULAR | Status: AC
  Filled 2024-01-15: qty 2

## 2024-01-15 MED ORDER — PROPOFOL 10 MG/ML IV BOLUS
INTRAVENOUS | Status: DC | PRN
  Administered 2024-01-15: 160 mg via INTRAVENOUS

## 2024-01-15 MED ORDER — ACETAMINOPHEN 325 MG PO TABS: 650.0000 mg | ORAL_TABLET | Freq: Four times a day (QID) | ORAL | Status: DC | PRN

## 2024-01-15 MED ORDER — DEXAMETHASONE SODIUM PHOSPHATE 10 MG/ML IJ SOLN
INTRAMUSCULAR | Status: DC | PRN
  Administered 2024-01-15: 4 mg via INTRAVENOUS

## 2024-01-15 MED ORDER — MIDAZOLAM HCL 5 MG/5ML IJ SOLN
INTRAMUSCULAR | Status: DC | PRN
  Administered 2024-01-15 (×2): 1 mg via INTRAVENOUS

## 2024-01-15 MED ORDER — 0.9 % SODIUM CHLORIDE (POUR BTL) OPTIME
TOPICAL | Status: DC | PRN
  Administered 2024-01-15: 1000 mL

## 2024-01-15 MED ORDER — FENTANYL CITRATE PF 50 MCG/ML IJ SOSY
25.0000 ug | PREFILLED_SYRINGE | INTRAMUSCULAR | Status: DC | PRN
Start: 2024-01-15 — End: 2024-01-15

## 2024-01-15 MED ORDER — EPHEDRINE SULFATE-NACL 50-0.9 MG/10ML-% IV SOSY
PREFILLED_SYRINGE | INTRAVENOUS | Status: DC | PRN
  Administered 2024-01-15: 10 mg via INTRAVENOUS

## 2024-01-15 MED ORDER — VANCOMYCIN HCL 750 MG/150ML IV SOLN
750.0000 mg | Freq: Three times a day (TID) | INTRAVENOUS | Status: DC
  Administered 2024-01-15 – 2024-01-17 (×6): 750 mg via INTRAVENOUS
  Filled 2024-01-15 (×6): qty 150

## 2024-01-15 MED ORDER — LACTATED RINGERS IV SOLN: INTRAVENOUS | Status: DC

## 2024-01-15 MED ORDER — ONDANSETRON HCL 4 MG/2ML IJ SOLN: 4.0000 mg | Freq: Four times a day (QID) | INTRAMUSCULAR | Status: DC | PRN

## 2024-01-15 MED ORDER — PHENYLEPHRINE HCL (PRESSORS) 10 MG/ML IV SOLN
INTRAVENOUS | Status: DC | PRN
Start: 2024-01-15 — End: 2024-01-15
  Administered 2024-01-15 (×2): 160 ug via INTRAVENOUS

## 2024-01-15 MED ORDER — BUPIVACAINE-EPINEPHRINE 0.25% -1:200000 IJ SOLN
INTRAMUSCULAR | Status: DC | PRN
  Administered 2024-01-15: 4 mL

## 2024-01-15 MED ORDER — ACETAMINOPHEN 500 MG PO TABS
1000.0000 mg | ORAL_TABLET | Freq: Four times a day (QID) | ORAL | Status: DC
  Administered 2024-01-17: 1000 mg via ORAL
  Filled 2024-01-15 (×3): qty 2

## 2024-01-15 MED ORDER — EPHEDRINE SULFATE (PRESSORS) 50 MG/ML IJ SOLN
INTRAMUSCULAR | Status: DC | PRN
Start: 2024-01-15 — End: 2024-01-15
  Administered 2024-01-15 (×2): 5 mg via INTRAVENOUS

## 2024-01-15 MED ORDER — VANCOMYCIN HCL 1500 MG/300ML IV SOLN
1500.0000 mg | INTRAVENOUS | Status: AC
  Administered 2024-01-15: 1500 mg via INTRAVENOUS
  Filled 2024-01-15: qty 300

## 2024-01-15 SURGICAL SUPPLY — 36 items
BAG COUNTER SPONGE SURGICOUNT (BAG) IMPLANT
BNDG GAUZE DERMACEA FLUFF 4 (GAUZE/BANDAGES/DRESSINGS) IMPLANT
COVER SURGICAL LIGHT HANDLE (MISCELLANEOUS) ×2 IMPLANT
DERMABOND ADVANCED .7 DNX12 (GAUZE/BANDAGES/DRESSINGS) IMPLANT
DRAPE LAPAROSCOPIC ABDOMINAL (DRAPES) IMPLANT
DRAPE LAPAROTOMY T 102X78X121 (DRAPES) IMPLANT
DRAPE LAPAROTOMY T 98X78 PEDS (DRAPES) IMPLANT
DRAPE LAPAROTOMY TRNSV 102X78 (DRAPES) IMPLANT
DRAPE SHEET LG 3/4 BI-LAMINATE (DRAPES) IMPLANT
DRAPE UTILITY XL STRL (DRAPES) ×2 IMPLANT
ELECT REM PT RETURN 15FT ADLT (MISCELLANEOUS) ×2 IMPLANT
ELECTRODE CAUTERY BLDE TIP 2.5 (TIP) IMPLANT
GAUZE PACKING IODOFORM 1/4X15 (PACKING) IMPLANT
GAUZE PAD ABD 8X10 STRL (GAUZE/BANDAGES/DRESSINGS) IMPLANT
GAUZE SPONGE 4X4 12PLY STRL (GAUZE/BANDAGES/DRESSINGS) ×2 IMPLANT
GLOVE BIO SURGEON STRL SZ7.5 (GLOVE) ×2 IMPLANT
GLOVE INDICATOR 8.0 STRL GRN (GLOVE) ×2 IMPLANT
GOWN STRL REUS W/ TWL XL LVL3 (GOWN DISPOSABLE) ×2 IMPLANT
KIT BASIN OR (CUSTOM PROCEDURE TRAY) ×2 IMPLANT
KIT TURNOVER KIT A (KITS) IMPLANT
MARKER SKIN DUAL TIP RULER LAB (MISCELLANEOUS) IMPLANT
NDL HYPO 25X1 1.5 SAFETY (NEEDLE) ×2 IMPLANT
NEEDLE HYPO 25X1 1.5 SAFETY (NEEDLE) ×1 IMPLANT
PACK GENERAL/GYN (CUSTOM PROCEDURE TRAY) ×2 IMPLANT
PADDING CAST ABS COTTON 4X4 ST (CAST SUPPLIES) IMPLANT
SET HNDPC FAN SPRY TIP SCT (DISPOSABLE) IMPLANT
SPIKE FLUID TRANSFER (MISCELLANEOUS) IMPLANT
SPONGE T-LAP 4X18 ~~LOC~~+RFID (SPONGE) IMPLANT
STAPLER SKIN PROX 35W (STAPLE) IMPLANT
SUT MNCRL AB 4-0 PS2 18 (SUTURE) IMPLANT
SUT VIC AB 3-0 SH 18 (SUTURE) IMPLANT
SWAB COLLECTION DEVICE MRSA (MISCELLANEOUS) IMPLANT
SWAB CULTURE ESWAB REG 1ML (MISCELLANEOUS) IMPLANT
SYR BULB IRRIG 60ML STRL (SYRINGE) IMPLANT
SYR CONTROL 10ML LL (SYRINGE) ×2 IMPLANT
TOWEL OR 17X26 10 PK STRL BLUE (TOWEL DISPOSABLE) ×2 IMPLANT

## 2024-01-15 NOTE — Transfer of Care (Signed)
 Immediate Anesthesia Transfer of Care Note  Patient: Craig [name]  Procedure(s) Performed: IRRIGATION AND DEBRIDEMENT ABSCESS (Left)  Patient Location: PACU  Anesthesia Type:General  Level of Consciousness: awake, alert , oriented, and patient cooperative  Airway & Oxygen Therapy: Patient Spontanous Breathing and Patient connected to face mask oxygen  Post-op Assessment: Report given to RN and Post -op Vital signs reviewed and stable  Post vital signs: Reviewed and stable  Last Vitals:  Vitals Value Taken Time  BP 106/67 01/15/24 1405  Temp    Pulse 73 01/15/24 1408  Resp 10 01/15/24 1408  SpO2 97 % 01/15/24 1408  Vitals shown include unfiled device data.  Last Pain:  Vitals:   01/15/24 1242  TempSrc: Oral         Complications: No notable events documented.

## 2024-01-15 NOTE — Op Note (Signed)
 IRRIGATION AND DEBRIDEMENT ABSCESS  Operative Note (CSN: [id number])  Service  Date of Surgery: 01/15/2024 Admit Date: 01/15/2024 Performing Service: General Surgeons and Role:    Ramiro Burly, Lisa Rideau, MD - Primary  Op Note Pre-op Diagnosis: LEFT AXILLARY ABCESS Post-op Diagnosis: Left axillary abscess from infected cyst  Procedure(s): IRRIGATION AND DEBRIDEMENT ABSCESS  Findings: Abscess with copious infected fluid. Cyst capsule noted and excised.  Anesthesia: Choice Estimated Blood Loss: 5 mL  Complications: None Specimens:  ID Type Source Tests Collected by Time Destination  1 : Cyst capsule Tissue PATH Other SURGICAL PATHOLOGY [name], MD 01/15/2024 1337   A : Axillary abscess Body Fluid Wound AEROBIC/ANAEROBIC CULTURE W GRAM STAIN (SURGICAL/DEEP WOUND) [name], MD 01/15/2024 1333     Brief history / Indications for Surgery: Patient noted bump in left axilla several weeks ago, grew in size and had been evaluated by physicians and thought to be sebaceous cyst. More recently concern for infection of cyst but missed initial surgical clinic appointments with [facility]. Was started on doxycycline  by his PCP earlier this week but continued to have pain and worsening cellulitis. Started draining earlier yesterday evening. Seen in clinic at [facility] today and referred for same day surgery for I&D as more extensive than able to be addressed in clinic.  Procedure Details  Prior to the procedure, the risks, benefits, complications, treatment options, and expected outcomes were discussed with the patient and/or family, including but not limited to, the risks of bleeding, infection, post-op seroma, post-op hematoma, wound dehiscence, and recurrence.Craig [name]  Despite the risks, the patient has given informed consent for operative intervention.  The patient was taken to the Operating Room, identified as Craig [name] and the procedure verified as incision and drainage of left axillary abscess.   Identification pause was held and the above information confirmed.  The patient was placed in the supine position and General LMA anesthesia was induced. The left axilla and upper arm\ were prepped with betadine and draped in the typical sterile fashion.  A formal preincision time out was performed.  A cruciate incision was made over abscess. Copious purulent material drained and fibrinous tissue. The skin that was incised appeared thin and not very viable so this was excised and cruciate extended into elliptical incision. A hemostat was used to break up loculations in abscess cavity. Purulence was swabbed and sent for culture. A cyst capsule was encountered and this was excised along with an adjacent nodular area within the abscess cavity that may have been initial cyst. This was sent for pathology. Cavity was irrigated and extensive care taken for hemostasis which was confirmed at completion of case.   Cavity was packed with iodoform strip gauze and covered with 4x4s.   Instrument, sponge, and needle counts were correct at the conclusion of the case.   Post Op Plan: - Although cellulitis somewhat improved at completion of case, still significant amount of surrounding erythema although with great improvement of induration. Because of this, felt better to have patient admitted overnight for IV antibiotics and can likely send home tomorrow after wound check if cellulitis continues to improve. - Greatly appreciate medicine team admitting this patient given medical complexities. - Okay to change overlying 4x4s/ABD if soiled, leave packing in and surgical team will remove tomorrow.  Craig Jaffe, MD [facility] Date: 01/15/2024  Time: 2:12 PM

## 2024-01-15 NOTE — Progress Notes (Signed)
 Pharmacy Antibiotic Note  Craig [name] is a 68 y.o. male admitted on 01/15/2024 with cellulitis, axillary abscess.  Pharmacy has been consulted for Vanco dosing.  Active Problem(s): L axillary abscess with OR drainage 5/8 - Placed on doxy several days ago.   Plan:  Vanco 1500mg  x 1 (1305) Vancomycin 750 mg IV Q 8 hrs. Goal AUC 400-550. Expected AUC: 491 SCr used: 0.8     Height: 5' 8.5" (174 cm) Weight: 116.8 kg (257 lb 8 oz) IBW/kg (Calculated) : 69.55  Temp (24hrs), Avg:97.5 F (36.4 C), Min:97.4 F (36.3 C), Max:97.6 F (36.4 C)  Recent Labs  Lab 01/15/24 1422  CREATININE 0.80    Estimated Creatinine Clearance: 115.2 mL/min (by C-G formula based on SCr of 0.8 mg/dL).    Allergies  Allergen Reactions   Metoprolol Anaphylaxis    Pt states cardiac arrest occurred when given. Dr Mamie Searles as the EDP.  Pt states cardiac arrest occurred when given. Dr Mamie Searles as the EDP., Pt states cardiac arrest occurred when given. Dr Mamie Searles as the EDP.  Pt states cardiac arrest occurred when given. Dr Mamie Searles as the EDP.    Pt states cardiac arrest occurred when given. Dr Mamie Searles as the EDP. Pt states cardiac arrest occurred when given. Dr Mamie Searles as the EDP.    Pt states cardiac arrest occurred when given. Dr Mamie Searles as the EDP. Pt states cardiac arrest occurred when given. Dr Mamie Searles as the EDP. Pt states cardiac arrest occurred when given. Dr Mamie Searles as the EDP. Pt states cardiac arrest occurred when given. Dr Mamie Searles as the EDP. Pt states cardiac arrest occurred when given. Dr Mamie Searles as the EDP. Pt states cardiac arrest occurred when given. Dr Mamie Searles as the EDP.   Toprol Xl [Metoprolol Succinate] Anaphylaxis    Pt states cardiac arrest occurred when given. Dr Mamie Searles as the EDP.   Sulfamethoxazole -Trimethoprim  Nausea Only    Other Reaction(s): Other (see comments)   Wheat Rash    Was told according to a skin test  Other Reaction(s): Other (see comments)  Was told according  to a skin test , Was told according to a skin test   Was told according to a skin test     Was told according to a skin test  Was told according to a skin test     Was told according to a skin test  Was told according to a skin test  Was told according to a skin test  Was told according to a skin test  Was told according to a skin test   Other     Pt states he does not do well with myocins   Azithromycin  Anxiety    Allergy if on long periods at a time  Other Reaction(s): Angioedema  Allergy if on long periods at a time, Allergy if on long periods at a time, Allergy if on long periods at a time  Allergy if on long periods at a time    Allergy if on long periods at a time Allergy if on long periods at a time Allergy if on long periods at a time    Allergy if on long periods at a time Allergy if on long periods at a time Allergy if on long periods at a time Allergy if on long periods at a time Allergy if on long periods at a time Allergy if on long periods at a time  Ciprofloxacin Anxiety    nervous  Pt says he can take levaquin    Zithromax  [Azithromycin  Dihydrate] Anxiety    Allergy if on long periods at a time    Weda Baumgarner S. Zannie Hey, PharmD, BCPS Clinical Staff Pharmacist  [facility]is [facility] 01/15/2024 3:53 PM

## 2024-01-15 NOTE — Anesthesia Preprocedure Evaluation (Addendum)
 Anesthesia Evaluation  Patient identified by MRN, date of birth, ID band Patient awake    Reviewed: Allergy & Precautions, NPO status , Patient's Chart, lab work & pertinent test results, reviewed documented beta blocker date and time   History of Anesthesia Complications Negative for: history of anesthetic complications  Airway Mallampati: III  TM Distance: >3 FB     Dental no notable dental hx.    Pulmonary shortness of breath and with exertion, asthma , sleep apnea and Continuous Positive Airway Pressure Ventilation , former smoker   breath sounds clear to auscultation       Cardiovascular hypertension, (-) CAD, (-) Past MI, (-) Cardiac Stents and (-) CABG  Rhythm:Regular Rate:Normal     Neuro/Psych neg Seizures PSYCHIATRIC DISORDERS Anxiety        GI/Hepatic ,neg GERD  ,,(+) neg Cirrhosis        Endo/Other  diabetes    Renal/GU Renal disease     Musculoskeletal   Abdominal   Peds  Hematology   Anesthesia Other Findings   Reproductive/Obstetrics                             Anesthesia Physical Anesthesia Plan  ASA: 2  Anesthesia Plan: General   Post-op Pain Management:    Induction: Intravenous  PONV Risk Score and Plan: 1 and Ondansetron  and Dexamethasone   Airway Management Planned: Natural Airway and Simple Face Mask  Additional Equipment:   Intra-op Plan:   Post-operative Plan:   Informed Consent: I have reviewed the patients History and Physical, chart, labs and discussed the procedure including the risks, benefits and alternatives for the proposed anesthesia with the patient or authorized representative who has indicated his/her understanding and acceptance.     Dental advisory given  Plan Discussed with: CRNA  Anesthesia Plan Comments:         Anesthesia Quick Evaluation

## 2024-01-15 NOTE — Anesthesia Procedure Notes (Signed)
 Procedure Name: LMA Insertion Date/Time: 01/15/2024 1:18 PM  Performed by: Patt Boozer, CRNAPre-anesthesia Checklist: Patient identified, Emergency Drugs available, Suction available and Patient being monitored Patient Re-evaluated:Patient Re-evaluated prior to induction Oxygen Delivery Method: Circle system utilized Preoxygenation: Pre-oxygenation with 100% oxygen Induction Type: IV induction Ventilation: Mask ventilation without difficulty LMA: LMA inserted LMA Size: 4.0 Tube type: Oral Number of attempts: 1 Placement Confirmation: positive ETCO2 and breath sounds checked- equal and bilateral Tube secured with: Tape Dental Injury: Teeth and Oropharynx as per pre-operative assessment

## 2024-01-15 NOTE — Addendum Note (Signed)
 Addendum  created 01/15/24 1702 by Leslye Rast, MD   Clinical Note Signed

## 2024-01-15 NOTE — H&P (Signed)
 History and Physical  [name]:[id number] DOB: 1956/08/25 DOA: 01/15/2024  PCP: Craig Combs, MD   Chief Complaint: Left axillary abscess  HPI: Craig [name] is a 68 y.o. male with medical history significant for hypertension, hyperlipidemia, DM 2 not currently on medication being admitted by the hospitalist service after incision of drainage of left axillary abscess today.  Patient states that he has had this cyst in his axilla for quite some time, starting about 4 days ago started to cause more pain, became inflamed and erythematous.  He was started on outpatient doxycycline  a couple of days ago however continued to worsen so had urgent surgical evaluation today with [facility].  He saw him in the office this morning and brought him to the hospital at [facility] for surgical debridement with general esthesia.  This went well without any complications, given his medical comorbidities and appearance of his cellulitis, hospitalist admission was requested.  Review of Systems: Please see HPI for pertinent positives and negatives. A complete 10 system review of systems are otherwise negative.  Past Medical History:  Diagnosis Date   Aneurysm of left common iliac artery (HCC) 04/2021   followed by cardiology---   distal 3cm and prox 2.3,  stable per last CT 04-17-2021  incidental finding   BPH with urinary obstruction    Degenerative arthritis of spine    Diverticulosis of colon    Foley catheter in place    GAD (generalized anxiety disorder)    History of prostatitis    Hyperlipemia    Hypertension    Idiopathic chronic gout    followed by pcp  (previous seen by rheumatologist-- dr Craig [name]);    multiple sites  (07-25-2022  per pt last flare-up 2 wks ago, right foot/ ankle,  stated resolved)   OSA (obstructive sleep apnea)    by dr Craig [name]---  per pt dx yrs ago but did not use cpap, refused   Palpitations    followed by cardiology-- dr Craig [name];    last event monitor  09-16-2017  SR w/ SB  few PVCs/ PACs   Pre-diabetes    PVC's (premature ventricular contractions)    Tachycardia    January, 2013   Urinary retention    Vitreous floaters of both eyes    Wears glasses    Past Surgical History:  Procedure Laterality Date   SHOULDER SURGERY Right 1978   for recurrent dislocation   TRANSURETHRAL RESECTION OF PROSTATE N/A 08/07/2022   Procedure: TRANSURETHRAL RESECTION OF THE PROSTATE (TURP);  Surgeon: Craig Banker, MD;  [facility];  Service: Urology;  Laterality: N/A;  90 MINUTES NEEDED FOR CASE   WISDOM TOOTH EXTRACTION     Social History:  reports that he has quit smoking. His smoking use included cigarettes. He has never used smokeless tobacco. He reports that he does not currently use alcohol. He reports that he does not use drugs.  Allergies  Allergen Reactions   Metoprolol Anaphylaxis    Pt states cardiac arrest occurred when given. Dr Craig [name] as the EDP.  Pt states cardiac arrest occurred when given. Dr Craig [name] as the EDP., Pt states cardiac arrest occurred when given. Dr Craig [name] as the EDP.  Pt states cardiac arrest occurred when given. Dr Craig [name] as the EDP.    Pt states cardiac arrest occurred when given. Dr Craig [name] as the EDP. Pt states cardiac arrest occurred when given. Dr Craig [name] as the EDP.  Pt states cardiac arrest occurred when given. Dr Craig [name] as the EDP. Pt states cardiac arrest occurred when given. Dr Craig [name] as the EDP. Pt states cardiac arrest occurred when given. Dr Craig [name] as the EDP. Pt states cardiac arrest occurred when given. Dr Craig [name] as the EDP. Pt states cardiac arrest occurred when given. Dr Craig [name] as the EDP. Pt states cardiac arrest occurred when given. Dr Craig [name] as the EDP.   Toprol Xl [Metoprolol Succinate] Anaphylaxis    Pt states cardiac arrest occurred when given. Dr Craig [name] as the EDP.   Sulfamethoxazole -Trimethoprim  Nausea Only    Other Reaction(s): Other (see comments)    Wheat Rash    Was told according to a skin test  Other Reaction(s): Other (see comments)  Was told according to a skin test , Was told according to a skin test   Was told according to a skin test     Was told according to a skin test  Was told according to a skin test     Was told according to a skin test  Was told according to a skin test  Was told according to a skin test  Was told according to a skin test  Was told according to a skin test   Other     Pt states he does not do well with myocins   Azithromycin  Anxiety    Allergy if on long periods at a time  Other Reaction(s): Angioedema  Allergy if on long periods at a time, Allergy if on long periods at a time, Allergy if on long periods at a time  Allergy if on long periods at a time    Allergy if on long periods at a time Allergy if on long periods at a time Allergy if on long periods at a time    Allergy if on long periods at a time Allergy if on long periods at a time Allergy if on long periods at a time Allergy if on long periods at a time Allergy if on long periods at a time Allergy if on long periods at a time   Ciprofloxacin Anxiety    nervous  Pt says he can take levaquin    Zithromax  [Azithromycin  Dihydrate] Anxiety    Allergy if on long periods at a time    Family History  Problem Relation Age of Onset   Hypertension Mother    Diverticulosis Sister    Diverticulosis Maternal Grandfather      Prior to Admission medications   Medication Sig Start Date End Date Taking? Authorizing Provider  ALPRAZolam  (XANAX ) 0.5 MG tablet Take 0.5 mg by mouth 4 (four) times daily.   Yes [provider]  atenolol  (TENORMIN ) 25 MG tablet TAKE 1/2 TABLET BY MOUTH ONCE DAILY. 03/07/23  Yes Weaver, Craig [name]  allopurinol  (ZYLOPRIM ) 100 MG tablet Take 200 mg by mouth daily.    [provider]  amitriptyline  (ELAVIL ) 10 MG tablet Take 10 mg by mouth at bedtime.    [provider]  aspirin  EC 81 MG  tablet Take 1 tablet (81 mg total) by mouth daily. Swallow whole. Patient not taking: Reported on 01/15/2023 03/27/22   Marlyse Single T, [name]  cholecalciferol (VITAMIN D3) 25 MCG (1000 UNIT) tablet Take 1,000 Units by mouth daily.    [provider]  colchicine 0.6 MG tablet Take 0.6 mg by mouth daily. As needed for gout flare.    [provider]  Docusate Calcium  (STOOL SOFTENER PO) Take 1 tablet by mouth 2 (two) times daily as needed (constipation). Per pt currently taking one at night and sometimes one in the morning    [provider]  levofloxacin  (LEVAQUIN ) 500 MG tablet Take 1 tablet (500 mg total) by mouth daily. 08/19/23   [place] Butter, PA  nystatin  (MYCOSTATIN /NYSTOP ) powder Apply 1 Application topically 2 (two) times daily. 08/19/23   Neil Balls A, PA  psyllium (METAMUCIL SMOOTH TEXTURE) 58.6 % powder Take 1 packet by mouth 3 (three) times daily. 03/27/23   Debbra Fairy, [name]  rosuvastatin  (CRESTOR ) 10 MG tablet Take 1 tablet (10 mg total) by mouth daily. Patient not taking: Reported on 01/15/2023 03/27/22   [place]arlyse Single T, [name]  vitamin B-12 (CYANOCOBALAMIN) 100 MCG tablet Take 100 mcg by mouth daily.    [provider]    Physical Exam: BP 115/73   Pulse 81   Temp 97.6 F (36.4 C)   Resp 16   Ht 5' 8.5" (1.74 m)   Wt 116.8 kg   SpO2 95%   BMI 38.58 kg/m  General:  Alert, oriented, calm, in no acute distress, looks comfortable and nontoxic Cardiovascular: RRR, no murmurs or rubs, no peripheral edema  Respiratory: clear to auscultation bilaterally, no wheezes, no crackles  Abdomen: soft, nontender, nondistended, normal bowel tones heard  Skin: dry, no rashes, left axilla with bandage in place, no visible erythema, induration, mild tenderness Musculoskeletal: no joint effusions, normal range of motion  Psychiatric: appropriate affect, normal speech  Neurologic: extraocular muscles intact, clear speech, moving all extremities with intact  sensorium         Labs on Admission:  Basic Metabolic Panel: Recent Labs  Lab 01/15/24 1422  NA 137  K 4.3  CL 106  CO2 25  GLUCOSE 115*  BUN 11  CREATININE 0.80  CALCIUM  8.9   Liver Function Tests: No results for input(s): "AST", "ALT", "ALKPHOS", "BILITOT", "PROT", "ALBUMIN" in the last 168 hours. No results for input(s): "LIPASE", "AMYLASE" in the last 168 hours. No results for input(s): "AMMONIA" in the last 168 hours. CBC: No results for input(s): "WBC", "NEUTROABS", "HGB", "HCT", "MCV", "PLT" in the last 168 hours. Cardiac Enzymes: No results for input(s): "CKTOTAL", "CKMB", "CKMBINDEX", "TROPONINI" in the last 168 hours. BNP (last 3 results) No results for input(s): "BNP" in the last 8760 hours.  ProBNP (last 3 results) Recent Labs    01/15/23 1612  PROBNP 91    CBG: No results for input(s): "GLUCAP" in the last 168 hours.  Radiological Exams on Admission: No results found. Assessment/Plan Craig [name] is a 68 y.o. male with medical history significant for hypertension, hyperlipidemia, DM 2 not currently on medication being admitted by the hospitalist service after incision of drainage of left axillary abscess today.   Left axillary abscess-now status post incision and drainage by general surgery in the OR today 5/8.  The patient is hemodynamically stable, with no evidence of sepsis. -Observation admission -Empiric IV vancomycin -Follow-up wound culture -Will check CBC and BMP -Packing to remain in place until examined by surgical team in the morning  Chronic urinary retention-continue Foley catheter  Type 2 diabetes-patient states he does not take any diabetic medications, and that his blood sugar is acceptable.  Unknown hemoglobin A1c. -Carb modified diet -Sliding scale insulin -Obtain hemoglobin A1c  Hyperlipidemia-previously on statin, however is noncompliant with this medication  DVT prophylaxis: Lovenox     Code Status: Full Code  Consults  called: General Surgery is following  Admission status: Observation  Time spent: 48 minutes  Travus Oren Rickey Charm MD [facility] Pager [phone]  If 7PM-7AM, please contact night-coverage [email or web address] Password [facility]  01/15/2024, 3:58 PM

## 2024-01-15 NOTE — Anesthesia Postprocedure Evaluation (Signed)
 Anesthesia Post Note  Patient: Craig [name]  Procedure(s) Performed: IRRIGATION AND DEBRIDEMENT ABSCESS (Left)     Patient location during evaluation: PACU Anesthesia Type: General Level of consciousness: awake and alert Pain management: pain level controlled Vital Signs Assessment: post-procedure vital signs reviewed and stable Respiratory status: spontaneous breathing, nonlabored ventilation, respiratory function stable and patient connected to nasal cannula oxygen Cardiovascular status: blood pressure returned to baseline and stable Postop Assessment: no apparent nausea or vomiting Anesthetic complications: no   No notable events documented.  Last Vitals:  Vitals:   01/15/24 1515 01/15/24 1558  BP: 115/73 130/74  Pulse: 81 78  Resp: 16   Temp:  (!) 36.3 C  SpO2: 95% 97%    Last Pain:  Vitals:   01/15/24 1558  TempSrc: Oral  PainSc:                  Leslye Rast

## 2024-01-15 NOTE — H&P (Signed)
 Chief Complaint: New Consultation   History of Present Illness: Craig [name] is a 68 y.o. male who is seen today as an office consultation for evaluation of an axillary abscess. He reports he has had a small cyst in the left axilla for some time. He was scheduled to see me next week to evaluate for elective removal.  About 5 days ago, he noticed increased pain and redness overlying the cyst.  He called his PCP and sent a photo, and was started on oral doxycycline  2 days ago.  He has not noticed any improvement since then, and has started to have spontaneous drainage from the area.  He called this morning and presented to urgent office today.   He does not take any blood thinners.  He has not had anything to eat or drink today.       Review of Systems: A complete review of systems was obtained from the patient.  I have reviewed this information and discussed as appropriate with the patient.  See HPI as well for other ROS.     Medical History:  Past Medical History Past Medical History: Diagnosis Date  Aneurysm ()    Anxiety    Arrhythmia    Arthritis    Diabetes mellitus without complication (CMS/HHS-HCC)    Hyperlipidemia    Hypertension    Sleep apnea          Problem List There is no problem list on file for this patient.      Past Surgical History Past Surgical History: Procedure Laterality Date  TURP VAPORIZATION           Allergies Allergies Allergen Reactions  Ciprofloxacin Angioedema and Anxiety     nervous, nervous   nervous    nervous nervous   nervous  nervous   nervous    Pt says he can take levaquin        Medications Ordered Prior to Encounter Current Outpatient Medications on File Prior to Visit Medication Sig Dispense Refill  allopurinoL  (ZYLOPRIM ) 100 MG tablet Take 200 mg by mouth once daily      amitriptyline  (ELAVIL ) 10 MG tablet Take 1 tablet by mouth at bedtime      atenoloL  (TENORMIN ) 25 MG tablet Take 12.5 mg by mouth once  daily      doxycycline  (MONODOX ) 100 MG capsule Take 100 mg by mouth      silodosin (RAPAFLO) 8 mg capsule Take 8 mg by mouth        No current facility-administered medications on file prior to visit.       Family History Family History Problem Relation Age of Onset  High blood pressure (Hypertension) Mother    High blood pressure (Hypertension) Sister         Tobacco Use History Social History    Tobacco Use Smoking Status Former  Types: Cigarettes  Start date: 1977 Smokeless Tobacco Never       Social History Social History    Socioeconomic History  Marital status: Married Tobacco Use  Smoking status: Former     Types: Cigarettes     Start date: 1977  Smokeless tobacco: Never Substance and Sexual Activity  Alcohol use: Never  Drug use: Never    Social Drivers of Manufacturing engineer Strain: Low Risk  (12/29/2023)   Received from [facility]   Overall Financial Resource Strain (CARDIA)    Difficulty of Paying Living Expenses: Not very hard Food Insecurity: No Food Insecurity (12/29/2023)  Received from [facility] Vital Sign    Worried About Running Out of Food in the Last Year: Never true    Ran Out of Food in the Last Year: Never true Transportation Needs: No Transportation Needs (12/29/2023)   [facility] - Transportation    Lack of Transportation (Medical): No    Lack of Transportation (Non-Medical): No Physical Activity: Insufficiently Active (12/29/2023)   Received from [facility]   Exercise Vital Sign    Days of Exercise per Week: 3 days    Minutes of Exercise per Session: 30 min Stress: No Stress Concern Present (12/29/2023)   Received from [facility] - Occupational Stress Questionnaire    Feeling of Stress : Only a little Social Connections: Moderately Integrated (12/29/2023)   Received from [facility]   Social Network    How would you  rate your social network (family, work, friends)?: Adequate participation with social networks Housing Stability: Unknown (01/15/2024)   Housing Stability Vital Sign    Homeless in the Last Year: No      Objective:   Vitals:   01/15/24 1039 01/15/24 1040 BP: 120/60   Pulse: 79   Temp: 37 C (98.6 F)   SpO2: 98%   Weight: (!) 116.8 kg (257 lb 9.6 oz)   Height: 172.7 cm (5\' 8" )   PainSc:   0-No pain PainLoc:   Abdomen   Body mass index is 39.17 kg/m.   Physical Exam Vitals reviewed.  Constitutional:      General: He is not in acute distress.    Appearance: Normal appearance.  HENT:     Head: Normocephalic and atraumatic.  Pulmonary:     Effort: Pulmonary effort is normal. No respiratory distress.  Genitourinary:    Comments: Indwelling Foley, draining clear yellow urine. Skin:    Comments: Raised lesion in the left axilla, with purulent drainage expressed, and sloughing of the overlying skin.  There is significant surrounding cellulitis extending onto the upper arm.  Neurological:     General: No focal deficit present.     Mental Status: He is alert and oriented to person, place, and time.          Assessment and Plan:   Assessment Diagnoses and all orders for this visit:   Abscess of axilla, left   Cellulitis of left upper arm     68 yo male with an abscess and associated cellulitis of the left axilla.  There is some spontaneous drainage, but given the overlying skin sloughing and extensive cellulitis, I feel this needs to be unroofed and debrided, which would be better achieved in the operating room with sedation.  Patient has been n.p.o. since last night.  I had discussed with my partner Dr. Ramiro Burly at [facility], who will take the patient to the OR this afternoon for incision and drainage.  I reviewed the planned procedure with the patient, and discussed that he will have an open wound postoperatively.  Pending on intraoperative findings, he may be admitted for  overnight observation.  All questions were answered.  He was instructed to remain n.p.o. and to proceed to [facility].   Karleen Overall, MD [facility] Surgery General, Hepatobiliary and Pancreatic Surgery 01/15/24 11:35 AM

## 2024-01-15 NOTE — Interval H&P Note (Signed)
 History and Physical Interval Note:  01/15/2024 12:39 PM  Craig [name]  has presented today for surgery, with the diagnosis of LEFT AXILLARY ABCESS.  The various methods of treatment have been discussed with the patient and family. After consideration of risks, benefits and other options for treatment, the patient has consented to  Procedure(s) with comments: IRRIGATION AND DEBRIDEMENT ABSCESS (Left) - LEFT AXILLARY ABCESS as a surgical intervention.  The patient's history has been reviewed, patient examined, no change in status, stable for surgery.  I have reviewed the patient's chart and labs.  Questions were answered to the patient's satisfaction.     [name]

## 2024-01-15 NOTE — Plan of Care (Signed)

## 2024-01-16 ENCOUNTER — Encounter (HOSPITAL_COMMUNITY): Payer: Self-pay | Admitting: General Surgery

## 2024-01-16 DIAGNOSIS — Z87891 Personal history of nicotine dependence: Secondary | ICD-10-CM | POA: Diagnosis not present

## 2024-01-16 DIAGNOSIS — E785 Hyperlipidemia, unspecified: Secondary | ICD-10-CM | POA: Diagnosis present

## 2024-01-16 DIAGNOSIS — M1000 Idiopathic gout, unspecified site: Secondary | ICD-10-CM | POA: Diagnosis not present

## 2024-01-16 DIAGNOSIS — E7800 Pure hypercholesterolemia, unspecified: Secondary | ICD-10-CM | POA: Diagnosis not present

## 2024-01-16 DIAGNOSIS — F419 Anxiety disorder, unspecified: Secondary | ICD-10-CM

## 2024-01-16 DIAGNOSIS — Z7982 Long term (current) use of aspirin: Secondary | ICD-10-CM | POA: Diagnosis not present

## 2024-01-16 DIAGNOSIS — B950 Streptococcus, group A, as the cause of diseases classified elsewhere: Secondary | ICD-10-CM | POA: Diagnosis present

## 2024-01-16 DIAGNOSIS — N138 Other obstructive and reflux uropathy: Secondary | ICD-10-CM | POA: Diagnosis present

## 2024-01-16 DIAGNOSIS — N401 Enlarged prostate with lower urinary tract symptoms: Secondary | ICD-10-CM | POA: Diagnosis present

## 2024-01-16 DIAGNOSIS — L02412 Cutaneous abscess of left axilla: Secondary | ICD-10-CM | POA: Diagnosis present

## 2024-01-16 DIAGNOSIS — E119 Type 2 diabetes mellitus without complications: Secondary | ICD-10-CM | POA: Diagnosis present

## 2024-01-16 DIAGNOSIS — Z6838 Body mass index (BMI) 38.0-38.9, adult: Secondary | ICD-10-CM | POA: Diagnosis not present

## 2024-01-16 DIAGNOSIS — E66812 Obesity, class 2: Secondary | ICD-10-CM | POA: Diagnosis present

## 2024-01-16 DIAGNOSIS — Z91018 Allergy to other foods: Secondary | ICD-10-CM | POA: Diagnosis not present

## 2024-01-16 DIAGNOSIS — I10 Essential (primary) hypertension: Secondary | ICD-10-CM | POA: Diagnosis present

## 2024-01-16 DIAGNOSIS — Z888 Allergy status to other drugs, medicaments and biological substances status: Secondary | ICD-10-CM | POA: Diagnosis not present

## 2024-01-16 DIAGNOSIS — Z882 Allergy status to sulfonamides status: Secondary | ICD-10-CM | POA: Diagnosis not present

## 2024-01-16 DIAGNOSIS — R339 Retention of urine, unspecified: Secondary | ICD-10-CM | POA: Diagnosis present

## 2024-01-16 DIAGNOSIS — Z9079 Acquired absence of other genital organ(s): Secondary | ICD-10-CM | POA: Diagnosis not present

## 2024-01-16 DIAGNOSIS — Z881 Allergy status to other antibiotic agents status: Secondary | ICD-10-CM | POA: Diagnosis not present

## 2024-01-16 DIAGNOSIS — M1A00X0 Idiopathic chronic gout, unspecified site, without tophus (tophi): Secondary | ICD-10-CM | POA: Diagnosis present

## 2024-01-16 DIAGNOSIS — L03114 Cellulitis of left upper limb: Secondary | ICD-10-CM | POA: Diagnosis present

## 2024-01-16 DIAGNOSIS — G4733 Obstructive sleep apnea (adult) (pediatric): Secondary | ICD-10-CM | POA: Diagnosis present

## 2024-01-16 DIAGNOSIS — Z79899 Other long term (current) drug therapy: Secondary | ICD-10-CM | POA: Diagnosis not present

## 2024-01-16 DIAGNOSIS — Z8249 Family history of ischemic heart disease and other diseases of the circulatory system: Secondary | ICD-10-CM | POA: Diagnosis not present

## 2024-01-16 DIAGNOSIS — L02419 Cutaneous abscess of limb, unspecified: Secondary | ICD-10-CM | POA: Diagnosis present

## 2024-01-16 LAB — BASIC METABOLIC PANEL WITH GFR
Anion gap: 9 (ref 5–15)
BUN: 10 mg/dL (ref 8–23)
CO2: 22 mmol/L (ref 22–32)
Calcium: 9.2 mg/dL (ref 8.9–10.3)
Chloride: 106 mmol/L (ref 98–111)
Creatinine, Ser: 0.66 mg/dL (ref 0.61–1.24)
GFR, Estimated: >60 mL/min (ref >60)
Glucose, Bld: 107 mg/dL — ABNORMAL HIGH (ref 70–99)
Potassium: 4.3 mmol/L (ref 3.5–5.1)
Sodium: 137 mmol/L (ref 135–145)

## 2024-01-16 LAB — CBC
HCT: 44.2 % (ref 39.0–52.0)
Hemoglobin: 14.9 g/dL (ref 13.0–17.0)
MCH: 32.5 pg (ref 26.0–34.0)
MCHC: 33.7 g/dL (ref 30.0–36.0)
MCV: 96.5 fL (ref 80.0–100.0)
Platelets: 240 10*3/uL (ref 150–400)
RBC: 4.58 MIL/uL (ref 4.22–5.81)
RDW: 12.7 % (ref 11.5–15.5)
WBC: 9 10*3/uL (ref 4.0–10.5)
nRBC: 0 % (ref 0.0–0.2)

## 2024-01-16 LAB — GLUCOSE, CAPILLARY
Glucose-Capillary: 92 mg/dL (ref 70–99)
Glucose-Capillary: 101 mg/dL — ABNORMAL HIGH (ref 70–99)
Glucose-Capillary: 95 mg/dL (ref 70–99)
Glucose-Capillary: 125 mg/dL — ABNORMAL HIGH (ref 70–99)

## 2024-01-16 LAB — HEMOGLOBIN A1C
Hgb A1c MFr Bld: 4.4 % — ABNORMAL LOW (ref 4.8–5.6)
Mean Plasma Glucose: 79.58 mg/dL

## 2024-01-16 MED ORDER — ATENOLOL 25 MG PO TABS
12.5000 mg | ORAL_TABLET | Freq: Every day | ORAL | Status: DC
Start: 2024-01-16 — End: 2024-01-17
  Administered 2024-01-16 – 2024-01-17 (×2): 12.5 mg via ORAL
  Filled 2024-01-16 (×2): qty 1

## 2024-01-16 MED ORDER — MELATONIN 3 MG PO TABS
3.0000 mg | ORAL_TABLET | Freq: Every day | ORAL | Status: DC
  Administered 2024-01-16: 3 mg via ORAL
  Filled 2024-01-16: qty 1

## 2024-01-16 MED ORDER — ALLOPURINOL 300 MG PO TABS
150.0000 mg | ORAL_TABLET | Freq: Every day | ORAL | Status: DC
  Administered 2024-01-16 – 2024-01-17 (×2): 150 mg via ORAL
  Filled 2024-01-16 (×2): qty 1

## 2024-01-16 MED ORDER — TAMSULOSIN HCL 0.4 MG PO CAPS
0.4000 mg | ORAL_CAPSULE | Freq: Every day | ORAL | Status: DC
  Filled 2024-01-16 (×2): qty 1

## 2024-01-16 NOTE — Plan of Care (Signed)
  Problem: Education: Goal: Knowledge of General Education information will improve Description: Including pain rating scale, medication(s)/side effects and non-pharmacologic comfort measures Outcome: Progressing   Problem: Clinical Measurements: Goal: Ability to maintain clinical measurements within normal limits will improve Outcome: Progressing Goal: Will remain free from infection Outcome: Progressing   Problem: Elimination: Goal: Will not experience complications related to bowel motility Outcome: Progressing Goal: Will not experience complications related to urinary retention Outcome: Progressing   Problem: Pain Managment: Goal: General experience of comfort will improve and/or be controlled Outcome: Progressing   Problem: Safety: Goal: Ability to remain free from injury will improve Outcome: Progressing

## 2024-01-16 NOTE — TOC Initial Note (Signed)
 Transition of Care [facility]) - Initial/Assessment Note    Patient Details  Name: Craig [name] MRN: [id number] Date of Birth: July 11, 1956  Transition [facility]) CM/SW Contact:    Bari Leys, RN Phone Number: 01/16/2024, 2:24 PM  Clinical Narrative:  Met with patient at bedside to introduce role of TOC/NCM and review for dc planning, pt reports he has an established PCP and pharmacy, reports he is being followed by a Urologist for indwelling foley catheter, reports he resides with his wife and feels safe returning home, spouse to provide transportation at discharge. TOC will continue to follow.                    Expected Discharge Plan: Home/Self Care Barriers to Discharge: Continued Medical Work up   Patient Goals and CMS Choice Patient states their goals for this hospitalization and ongoing recovery are:: return home          Expected Discharge Plan and Services       Living arrangements for the past 2 months: Single Family Home                                      Prior Living Arrangements/Services Living arrangements for the past 2 months: Single Family Home Lives with:: Spouse Patient language and need for interpreter reviewed:: Yes Do you feel safe going back to the place where you live?: Yes      Need for Family Participation in Patient Care: Yes (Comment) Care giver support system in place?: Yes (comment)   Criminal Activity/Legal Involvement Pertinent to Current Situation/Hospitalization: No - Comment as needed  Activities of Daily Living   ADL Screening (condition at time of admission) Independently performs ADLs?: Yes (appropriate for developmental age) Is the patient deaf or have difficulty hearing?: No Does the patient have difficulty seeing, even when wearing glasses/contacts?: No Does the patient have difficulty concentrating, remembering, or making decisions?: No  Permission Sought/Granted                  Emotional  Assessment Appearance:: Appears stated age Attitude/Demeanor/Rapport: Engaged Affect (typically observed): Accepting Orientation: : Oriented to Self, Oriented to Place, Oriented to  Time, Oriented to Situation Alcohol / Substance Use: Not Applicable Psych Involvement: No (comment)  Admission diagnosis:  Axillary abscess [L02.419] Patient Active Problem List   Diagnosis Date Noted   Essential hypertension 01/16/2024   Hyperlipidemia 01/16/2024   Axillary abscess 01/15/2024   Shortness of breath 01/15/2023   Urinary retention 08/07/2022   Abnormal finding on CT scan 03/27/2022   Preoperative cardiovascular examination 03/27/2022   Aortic atherosclerosis (HCC) 03/27/2022   Tachycardia    Ejection fraction    Palpitations    Chest pain    Sleep apnea    Gout    Anxiety    Tobacco abuse    Diabetes mellitus (HCC)    Asthma    Diverticular disease    Overweight    Fatigue    PCP:  Chandler Combs, MD Pharmacy:   [facility] [place], [place] - [address] [place] [address] Phone: [phone] Fax: [phone]  [id number] - [place], [place] - [address] AT [facility] [address]  [place] [address] Phone: [phone] Fax: [phone]  [facility] #[id number] - [place],  [place] - [address] AT [facility] [address] [place]  [address] Phone: [phone] Fax: [phone]  [facility] [address] [place] - [address] [place] [address] Phone: [phone] Fax: [phone]     Social Drivers of Health (SDOH) Social History: SDOH Screenings   Food Insecurity: No Food Insecurity (01/15/2024)  Housing: Low Risk  (01/15/2024)  Transportation Needs: No Transportation Needs (01/15/2024)  Utilities: Not At Risk (01/15/2024)  Financial Resource Strain: Low Risk  (12/29/2023)   Received from [facility]  Physical Activity: Insufficiently Active (12/29/2023)   Received from [facility]  Social Connections: Unknown (01/15/2024)  Stress: No Stress Concern Present (12/29/2023)   Received from [facility]  Tobacco Use: Medium Risk (01/15/2024)   SDOH Interventions:     Readmission Risk Interventions    01/16/2024    2:21 PM  Readmission Risk Prevention Plan  Post Dischage Appt Complete  Medication Screening Complete  Transportation Screening Complete

## 2024-01-16 NOTE — Progress Notes (Signed)
 [facility]                                                                              Craig [name], is a 68 y.o. male, DOB - 24-Feb-1956, ZOX:[id number] Admit date - 01/15/2024    Outpatient Primary MD for the patient is Corrington, Kip A, MD  LOS - 0  days  No chief complaint on file.      Brief summary   Patient is a 68 year old male with HTN, HLP, diabetes mellitus type 2, currently on not on meds was admitted after incision and drainage of the left axillary abscess by surgery, Dr. Ramiro Burly.  Per patient, it started as a cyst in his axilla for some time and about 4 days PTA, started to have more pain, became inflamed and erythemic this.  He was started on outpatient doxycycline  however continued to worsen so urgent surgical evaluation was completed.  Patient underwent I&D by Dr. Ramiro Burly and was then recommended admission for observation overnight.   Assessment & Plan    Principal Problem: Left axillary abscess -Status post I&D by general surgery in the OR on 5/8 - Cultures negative so far - Continue IV vancomycin  - Await further mentations from general surgery regarding disposition  Active Problems:   Gout -Stable, no flare, continue allopurinol     Anxiety -Continue Xanax , amitriptyline  -Added melatonin for sleep    Essential hypertension -BP stable, atenolol  currently held    Hyperlipidemia - Not on statin   Obesity class II Estimated body mass index is 38.58 kg/m as calculated from the following:   Height as of this encounter: 5' 8.5" (1.74 m).   Weight as of this encounter: 116.8 kg.  Code Status: Full code DVT Prophylaxis:  enoxaparin  (LOVENOX ) injection 40 mg Start: 01/15/24 1800   Level of Care: Level of care: Med-Surg Family Communication: Updated patient Disposition Plan:      Remains inpatient appropriate: Awaiting surgery evaluation for disposition   Procedures:  I&D on 5/8  Consultants:   General Surgery  Antimicrobials:    Anti-infectives (From admission, onward)    Start     Dose/Rate Route Frequency Ordered Stop   01/16/24 0600  vancomycin  (VANCOREADY) IVPB 1500 mg/300 mL        1,500 mg 150 mL/hr over 120 Minutes Intravenous On call to O.R. 01/15/24 1213 01/15/24 1505   01/15/24 2100  vancomycin  (VANCOREADY) IVPB 750 mg/150 mL        750 mg 150 mL/hr over 60 Minutes Intravenous Every 8 hours 01/15/24 1555            Medications  acetaminophen   1,000 mg Oral Q6H   ALPRAZolam   0.5 mg Oral TID   amitriptyline   10 mg Oral QHS   enoxaparin  (LOVENOX ) injection  40 mg Subcutaneous Q24H   insulin  aspart  0-15 Units Subcutaneous TID WC   insulin  aspart  0-5 Units Subcutaneous QHS   melatonin  3 mg Oral QHS      Subjective:   Craig [name] was seen and examined today.  Awaiting general surgery evaluation for the wound today.  Did not sleep well last night and hoping to  go home today.  Patient denies dizziness, chest pain, shortness of breath, abdominal pain, N/V/D. No acute events overnight.    Objective:   Vitals:   01/15/24 1815 01/15/24 2001 01/16/24 0132 01/16/24 0632  BP: (!) 144/73 119/82 116/67 118/67  Pulse: 76 85 69 68  Resp: 18 17 16 16   Temp:  (!) 97.5 F (36.4 C) 97.8 F (36.6 C) 98.1 F (36.7 C)  TempSrc:  Oral Oral Oral  SpO2:  95% 97% 98%  Weight:      Height:        Intake/Output Summary (Last 24 hours) at 01/16/2024 1010 Last data filed at 01/16/2024 0600 Gross per 24 hour  Intake 2070 ml  Output 3230 ml  Net -1160 ml     Wt Readings from Last 3 Encounters:  01/15/24 116.8 kg  08/19/23 113.4 kg  03/27/23 111.1 kg     Exam General: Alert and oriented x 3, NAD Cardiovascular: S1 S2 auscultated,  RRR Respiratory: Clear to auscultation bilaterally, no wheezing Gastrointestinal: Soft, nontender, nondistended, + bowel sounds Ext: no pedal edema bilaterally Neuro: no new deficits Skin: LUE, axillary dressing intact Psych: Normal affect     Data Reviewed:  I  have personally reviewed following labs    CBC Lab Results  Component Value Date   WBC 9.0 01/16/2024   RBC 4.58 01/16/2024   HGB 14.9 01/16/2024   HCT 44.2 01/16/2024   MCV 96.5 01/16/2024   MCH 32.5 01/16/2024   PLT 240 01/16/2024   MCHC 33.7 01/16/2024   RDW 12.7 01/16/2024   LYMPHSABS 2.8 03/27/2023   MONOABS 0.8 03/27/2023   EOSABS 0.2 03/27/2023   BASOSABS 0.1 03/27/2023     Last metabolic panel Lab Results  Component Value Date   NA 137 01/16/2024   K 4.3 01/16/2024   CL 106 01/16/2024   CO2 22 01/16/2024   BUN 10 01/16/2024   CREATININE 0.66 01/16/2024   GLUCOSE 107 (H) 01/16/2024   GFRNONAA >60 01/16/2024   GFRAA 108 09/11/2017   CALCIUM  9.2 01/16/2024   PROT 7.3 03/27/2023   ALBUMIN 3.9 03/27/2023   BILITOT 1.1 03/27/2023   ALKPHOS 98 03/27/2023   AST 24 03/27/2023   ALT 22 03/27/2023   ANIONGAP 9 01/16/2024    CBG (last 3)  Recent Labs    01/15/24 1733 01/15/24 1958 01/16/24 0810  GLUCAP 154* 187* 92      Coagulation Profile: No results for input(s): "INR", "PROTIME" in the last 168 hours.   Radiology Studies: I have personally reviewed the imaging studies  No results found.     Bertram Brocks M.D. [facility] 01/16/2024, 10:10 AM  Available via Epic secure chat 7am-7pm After 7 pm, please refer to night coverage provider listed on amion.

## 2024-01-16 NOTE — Progress Notes (Signed)
 [facility] Progress Note  1 Day Post-Op  Subjective: CC:  Denies pain. Conversant.  Objective: Vital signs in last 24 hours: Temp:  [97.4 F (36.3 C)-98.1 F (36.7 C)] 98.1 F (36.7 C) (05/09 1610) Pulse Rate:  [68-85] 68 (05/09 0632) Resp:  [14-18] 16 (05/09 9604) BP: (105-144)/(66-85) 118/67 (05/09 5409) SpO2:  [89 %-99 %] 98 % (05/09 8119) Weight:  [116.8 kg] 116.8 kg (05/08 1222) Last BM Date : 01/14/24  Intake/Output from previous day: 05/08 0701 - 05/09 0700 In: 2070 [P.O.:870; I.V.:900; IV Piggyback:300] Out: 3230 [Urine:3225; Blood:5] Intake/Output this shift: No intake/output data recorded.  PE: Gen:  Alert, NAD, pleasant Card:  Regular rate and rhythm Pulm:  Normal effort ORA Abd: Soft, non-tender, non-distended MSK: LUE with surgical wound ~4 x 3 x 0.5 cm with clean wound base that is hemostatic. Mild surrounding cellulitis.  Skin: warm and dry, no rashes  Psych: A&Ox3   Lab Results:  Recent Labs    01/15/24 1606 01/16/24 0453  WBC 10.5 9.0  HGB 15.2 14.9  HCT 44.4 44.2  PLT 233 240   BMET Recent Labs    01/15/24 1422 01/16/24 0453  NA 137 137  K 4.3 4.3  CL 106 106  CO2 25 22  GLUCOSE 115* 107*  BUN 11 10  CREATININE 0.80 0.66  CALCIUM  8.9 9.2   PT/INR No results for input(s): "LABPROT", "INR" in the last 72 hours. CMP     Component Value Date/Time   NA 137 01/16/2024 0453   NA 138 01/15/2023 1612   K 4.3 01/16/2024 0453   CL 106 01/16/2024 0453   CO2 22 01/16/2024 0453   GLUCOSE 107 (H) 01/16/2024 0453   BUN 10 01/16/2024 0453   BUN 15 01/15/2023 1612   CREATININE 0.66 01/16/2024 0453   CALCIUM  9.2 01/16/2024 0453   PROT 7.3 03/27/2023 1142   ALBUMIN 3.9 03/27/2023 1142   AST 24 03/27/2023 1142   ALT 22 03/27/2023 1142   ALKPHOS 98 03/27/2023 1142   BILITOT 1.1 03/27/2023 1142   GFRNONAA >60 01/16/2024 0453   GFRAA 108 09/11/2017 1602   Lipase     Component Value Date/Time   LIPASE 33 03/27/2023 1142      Studies/Results: No results found.  Anti-infectives: Anti-infectives (From admission, onward)    Start     Dose/Rate Route Frequency Ordered Stop   01/16/24 0600  vancomycin  (VANCOREADY) IVPB 1500 mg/300 mL        1,500 mg 150 mL/hr over 120 Minutes Intravenous On call to O.R. 01/15/24 1213 01/15/24 1505   01/15/24 2100  vancomycin  (VANCOREADY) IVPB 750 mg/150 mL        750 mg 150 mL/hr over 60 Minutes Intravenous Every 8 hours 01/15/24 1555          Assessment/Plan RUE  abscess, likely infected cyst S/p I&D 5/8 Dr. Ramiro Burly Cx- GPC pairs Afebrile, WBO 9.0  Wound is clean. Continue daily moist-to-dry dressing changes. Continue IV abx today given cellulitis and then plan discharge home tomorrow on 5-7 days PO abx    LOS: 0 days   I reviewed nursing notes, hospitalist notes, last 24 h vitals and pain scores, last 48 h intake and output, last 24 h labs and trends, and last 24 h imaging results.  This care required straight-forward level of medical decision making.   Michial Akin, PA-C [facility] Please see Amion for pager number during day hours 7:00am-4:30pm

## 2024-01-17 ENCOUNTER — Other Ambulatory Visit (HOSPITAL_COMMUNITY): Payer: Self-pay

## 2024-01-17 DIAGNOSIS — L02419 Cutaneous abscess of limb, unspecified: Secondary | ICD-10-CM | POA: Diagnosis not present

## 2024-01-17 DIAGNOSIS — E7800 Pure hypercholesterolemia, unspecified: Secondary | ICD-10-CM | POA: Diagnosis not present

## 2024-01-17 DIAGNOSIS — I10 Essential (primary) hypertension: Secondary | ICD-10-CM | POA: Diagnosis not present

## 2024-01-17 DIAGNOSIS — F419 Anxiety disorder, unspecified: Secondary | ICD-10-CM | POA: Diagnosis not present

## 2024-01-17 LAB — GLUCOSE, CAPILLARY
Glucose-Capillary: 76 mg/dL (ref 70–99)
Glucose-Capillary: 116 mg/dL — ABNORMAL HIGH (ref 70–99)

## 2024-01-17 MED ORDER — ONDANSETRON HCL 4 MG PO TABS
4.0000 mg | ORAL_TABLET | Freq: Three times a day (TID) | ORAL | 0 refills | Status: DC | PRN
  Filled 2024-01-17: qty 30, 10d supply, fill #0

## 2024-01-17 MED ORDER — METHOCARBAMOL 500 MG PO TABS
500.0000 mg | ORAL_TABLET | Freq: Three times a day (TID) | ORAL | 0 refills | Status: DC | PRN
  Filled 2024-01-17: qty 30, 10d supply, fill #0

## 2024-01-17 MED ORDER — TRAMADOL HCL 50 MG PO TABS
50.0000 mg | ORAL_TABLET | Freq: Four times a day (QID) | ORAL | 0 refills | Status: AC | PRN
  Filled 2024-01-17: qty 20, 5d supply, fill #0

## 2024-01-17 MED ORDER — AMOXICILLIN-POT CLAVULANATE 875-125 MG PO TABS
1.0000 | ORAL_TABLET | Freq: Two times a day (BID) | ORAL | 0 refills | Status: AC
  Filled 2024-01-17: qty 10, 5d supply, fill #0

## 2024-01-17 NOTE — Progress Notes (Signed)
 2 Days Post-Op   Subjective/Chief Complaint: No complaints. Wants to go home. Chronic foley   Objective: Vital signs in last 24 hours: Temp:  [97.6 F (36.4 C)-98.2 F (36.8 C)] 98.2 F (36.8 C) (05/10 0626) Pulse Rate:  [64-78] 70 (05/10 0626) Resp:  [15-16] 15 (05/10 0626) BP: (106-115)/(67-77) 113/67 (05/10 0626) SpO2:  [95 %-98 %] 97 % (05/10 0626) Last BM Date : 01/17/24  Intake/Output from previous day: 05/09 0701 - 05/10 0700 In: 2040 [P.O.:1590; IV Piggyback:450] Out: 5400 [Urine:5400] Intake/Output this shift: Total I/O In: -  Out: 350 [Urine:350]  General appearance: alert and cooperative Resp: clear to auscultation bilaterally Cardio: regular rate and rhythm GI: soft, nontender. Axillary wound looks ok  Lab Results:  Recent Labs    01/15/24 1606 01/16/24 0453  WBC 10.5 9.0  HGB 15.2 14.9  HCT 44.4 44.2  PLT 233 240   BMET Recent Labs    01/15/24 1422 01/16/24 0453  NA 137 137  K 4.3 4.3  CL 106 106  CO2 25 22  GLUCOSE 115* 107*  BUN 11 10  CREATININE 0.80 0.66  CALCIUM  8.9 9.2   PT/INR No results for input(s): "LABPROT", "INR" in the last 72 hours. ABG No results for input(s): "PHART", "HCO3" in the last 72 hours.  Invalid input(s): "PCO2", "PO2"  Studies/Results: No results found.  Anti-infectives: Anti-infectives (From admission, onward)    Start     Dose/Rate Route Frequency Ordered Stop   01/16/24 0600  vancomycin  (VANCOREADY) IVPB 1500 mg/300 mL        1,500 mg 150 mL/hr over 120 Minutes Intravenous On call to O.R. 01/15/24 1213 01/15/24 1505   01/15/24 2100  vancomycin  (VANCOREADY) IVPB 750 mg/150 mL        750 mg 150 mL/hr over 60 Minutes Intravenous Every 8 hours 01/15/24 1555         Assessment/Plan: s/p Procedure(s) with comments: IRRIGATION AND DEBRIDEMENT ABSCESS (Left) - LEFT AXILLARY ABCESS Advance diet May remove packing and simply cover with clean dry gauze Continue oral abx for another 5 days Ok for d/c  when medically stable Chronic foley  LOS: 1 day    Lillette Reid III 01/17/2024

## 2024-01-17 NOTE — Progress Notes (Signed)
 IV removed, instructions reviewed with understanding verbalized, dressed, belongings packed, transferred to front entrance via wheelchair.

## 2024-01-17 NOTE — TOC Transition Note (Signed)
 Transition of Care [facility]) - Discharge Note   Patient Details  Name: Craig [name] MRN: [id number] Date of Birth: 15-Jul-1956  [facility]) CM/SW Contact:  Levie Ream, RN Phone Number: 01/17/2024, 11:35 AM   Clinical Narrative:    D/C orders received; no TOC needs.   Final next level of care: Home/Self Care Barriers to Discharge: No Barriers Identified   Patient Goals and CMS Choice Patient states their goals for this hospitalization and ongoing recovery are:: return home          Discharge Placement                       Discharge Plan and Services Additional resources added to the After Visit Summary for                                       Social Drivers of Health (SDOH) Interventions SDOH Screenings   Food Insecurity: No Food Insecurity (01/15/2024)  Housing: Low Risk  (01/15/2024)  Transportation Needs: No Transportation Needs (01/15/2024)  Utilities: Not At Risk (01/15/2024)  Financial Resource Strain: Low Risk  (12/29/2023)   Received from [facility]  Physical Activity: Insufficiently Active (12/29/2023)   Received from [facility]  Social Connections: Unknown (01/15/2024)  Stress: No Stress Concern Present (12/29/2023)   Received from [facility]  Tobacco Use: Medium Risk (01/15/2024)     Readmission Risk Interventions    01/16/2024    2:21 PM  Readmission Risk Prevention Plan  Post Dischage Appt Complete  Medication Screening Complete  Transportation Screening Complete

## 2024-01-17 NOTE — Discharge Summary (Signed)
 Physician Discharge Summary   Patient: Craig [name] MRN: [id number] DOB: Oct 17, 1955  Admit date:     01/15/2024  Discharge date: 01/17/24  Discharge Physician: Bertram Brocks, MD    PCP: Chandler Combs, MD   Recommendations at discharge:   Continue Augmentin 875-125 mg p.o. twice daily for 5 days Outpatient follow-up with the surgery scheduled   Discharge Diagnoses:    Axillary abscess   Gout   Anxiety   Essential hypertension   Hyperlipidemia   Hospital Course:  Patient is a 68 year old male with HTN, HLP, diabetes mellitus type 2, currently on not on meds was admitted after incision and drainage of the left axillary abscess by surgery, Dr. Ramiro Burly. Per patient, it started as a cyst in his axilla for some time and about 4 days PTA, started to have more pain, became inflamed and erythemic this. He was started on outpatient doxycycline  however continued to worsen so urgent surgical evaluation was completed. Patient underwent I&D by Dr. Ramiro Burly  Assessment and Plan:  Left axillary abscess -Status post I&D by general surgery in the OR on 5/8 - Patient was placed on IV vancomycin   - Cultures negative so far - Seen by general surgery today, cleared for discharge - May remove packing and cover with a clean dry gauze.  Discharge home with oral Augmentin p.o. twice daily for 5 days     Gout -Stable, no flare, continue allopurinol      Anxiety -Continue Xanax , amitriptyline       Essential hypertension -BP stable, continue atenolol      Hyperlipidemia - Not on statin     Obesity class II Estimated body mass index is 38.58 kg/m as calculated from the following:   Height as of this encounter: 5' 8.5" (1.74 m).   Weight as of this encounter: 116.8 kg.       Pain control - [place]  Controlled Substance Reporting System database was reviewed. and patient was instructed, not to drive, operate heavy machinery, perform activities at heights, swimming or participation  in water  activities or provide baby-sitting services while on Pain, Sleep and Anxiety Medications; until their outpatient Physician has advised to do so again. Also recommended to not to take more than prescribed Pain, Sleep and Anxiety Medications.  Consultants: General Surgery Procedures performed: I&D Disposition: Home Diet recommendation:  Discharge Diet Orders (From admission, onward)     Start     Ordered   01/17/24 0000  Diet Carb Modified        01/17/24 1120            DISCHARGE MEDICATION: Allergies as of 01/17/2024       Reactions   Metoprolol Anaphylaxis   Pt states cardiac arrest occurred when given. Dr Mamie Searles as the EDP.   Toprol Xl [metoprolol Succinate] Anaphylaxis   Pt states cardiac arrest occurred when given. Dr Mamie Searles as the EDP.   Sulfamethoxazole -trimethoprim  Nausea Only   Other Reaction(s): Other (see comments)   Wheat Rash   Was told according to a skin test   Other    Pt states he does not do well with myocins   Azithromycin  Anxiety   Allergy if on long periods at a time Other Reaction(s): Angioedema   Ciprofloxacin Anxiety   nervous Pt says he can take levaquin    Zithromax  [azithromycin  Dihydrate] Anxiety   Allergy if on long periods at a time        Medication List     STOP taking these medications  doxycycline  100 MG capsule Commonly known as: MONODOX        TAKE these medications    allopurinol  300 MG tablet Commonly known as: ZYLOPRIM  Take 150 mg by mouth daily.   ALPRAZolam  0.5 MG tablet Commonly known as: XANAX  Take 1 tablet by mouth 3 (three) times daily.   amitriptyline  10 MG tablet Commonly known as: ELAVIL  Take 10 mg by mouth at bedtime.   amoxicillin-clavulanate 875-125 MG tablet Commonly known as: AUGMENTIN Take 1 tablet by mouth 2 (two) times daily for 5 days.   atenolol  25 MG tablet Commonly known as: TENORMIN  TAKE 1/2 TABLET BY MOUTH ONCE DAILY.   cholecalciferol 25 MCG (1000 UNIT) tablet Commonly  known as: VITAMIN D3 Take 1,000 Units by mouth daily.   colchicine 0.6 MG tablet Take 0.6 mg by mouth daily. As needed for gout flare.   linaclotide 145 MCG Caps capsule Commonly known as: LINZESS Take 145 mcg by mouth daily.   Metamucil Smooth Texture 58.6 % powder Generic drug: psyllium Take 1 packet by mouth 3 (three) times daily.   methocarbamol  500 MG tablet Commonly known as: ROBAXIN  Take 1 tablet (500 mg total) by mouth every 8 (eight) hours as needed for muscle spasms.   ondansetron  4 MG tablet Commonly known as: Zofran  Take 1 tablet (4 mg total) by mouth every 8 (eight) hours as needed for nausea or vomiting.   silodosin 8 MG Caps capsule Commonly known as: RAPAFLO Take 8 mg by mouth daily.   STOOL SOFTENER PO Take 1 tablet by mouth 2 (two) times daily as needed (constipation). Per pt currently taking one at night and sometimes one in the morning   traMADol  50 MG tablet Commonly known as: Ultram  Take 1 tablet (50 mg total) by mouth every 6 (six) hours as needed for up to 5 days for severe pain (pain score 7-10) or moderate pain (pain score 4-6).   vitamin B-12 100 MCG tablet Commonly known as: CYANOCOBALAMIN Take 100 mcg by mouth daily.        Follow-up Information     Maczis, Puja Gosai, PA-C Follow up.   Specialty: General Surgery Why: 5/27 at 1:45. Please bring a copy of your photo ID, insurance card and arrive 30 minutes prior to your appointment for paperwork. Contact information: [address] [place] [address] [phone]         Corrington, Kip A, MD. Schedule an appointment as soon as possible for a visit in 2 week(s).   Specialty: Family Medicine Why: for hospital follow-up Contact information: [address] [place] [address] [phone]                Discharge Exam: Craig [name]   01/15/24 1222  Weight: 116.8 kg   S: No acute complaints, looking forward to go home today.  Cleared by general  surgery.  BP 113/67 (BP Location: Left Arm)   Pulse 70   Temp 98.2 F (36.8 C) (Oral)   Resp 15   Ht 5' 8.5" (1.74 m)   Wt 116.8 kg   SpO2 97%   BMI 38.58 kg/m   Physical Exam General: Alert and oriented x 3, NAD Cardiovascular: S1 S2 clear, RRR.  Respiratory: CTAB Gastrointestinal: Soft, nontender, nondistended, NBS Ext: no pedal edema bilaterally Neuro: no new deficits Skin: Significantly improved cellulitis in the left axillary region, no discharge. Psych: Normal affect    Condition at discharge: fair  The results of significant diagnostics from this  hospitalization (including imaging, microbiology, ancillary and laboratory) are listed below for reference.   Imaging Studies: No results found.  Microbiology: Results for orders placed or performed during the hospital encounter of 01/15/24  Aerobic/Anaerobic Culture w Gram Stain (surgical/deep wound)     Status: None (Preliminary result)   Collection Time: 01/15/24  1:33 PM   Specimen: Wound; Body Fluid  Result Value Ref Range Status   Specimen Description ABSCESS  Final   Special Requests AXILLARY  Final   Gram Stain   Final    RARE WBC PRESENT, PREDOMINANTLY PMN RARE GRAM POSITIVE COCCI IN PAIRS    Culture   Final    CULTURE REINCUBATED FOR BETTER GROWTH Performed at [facility], [address], [place], [place] [address]    Report Status PENDING  Incomplete    Labs: CBC: Recent Labs  Lab 01/15/24 1606 01/16/24 0453  WBC 10.5 9.0  HGB 15.2 14.9  HCT 44.4 44.2  MCV 96.3 96.5  PLT 233 240   Basic Metabolic Panel: Recent Labs  Lab 01/15/24 1422 01/16/24 0453  NA 137 137  K 4.3 4.3  CL 106 106  CO2 25 22  GLUCOSE 115* 107*  BUN 11 10  CREATININE 0.80 0.66  CALCIUM  8.9 9.2   Liver Function Tests: No results for input(s): "AST", "ALT", "ALKPHOS", "BILITOT", "PROT", "ALBUMIN" in the last 168 hours. CBG: Recent Labs  Lab 01/16/24 0810 01/16/24 1203 01/16/24 1658 01/16/24 2052  01/17/24 0807  GLUCAP 92 101* 95 125* 76    Discharge time spent: greater than 30 minutes.  Signed: Bertram Brocks, MD [facility] 01/17/2024

## 2024-01-17 NOTE — Plan of Care (Signed)

## 2024-01-17 NOTE — Plan of Care (Signed)
  Problem: Education: Goal: Knowledge of General Education information will improve Description: Including pain rating scale, medication(s)/side effects and non-pharmacologic comfort measures 01/17/2024 1053 by Kerwin Peels, RN Outcome: Adequate for Discharge 01/17/2024 1053 by Kerwin Peels, RN Outcome: Progressing   Problem: Health Behavior/Discharge Planning: Goal: Ability to manage health-related needs will improve 01/17/2024 1053 by Kerwin Peels, RN Outcome: Adequate for Discharge 01/17/2024 1053 by Kerwin Peels, RN Outcome: Progressing   Problem: Clinical Measurements: Goal: Ability to maintain clinical measurements within normal limits will improve 01/17/2024 1053 by Kerwin Peels, RN Outcome: Adequate for Discharge 01/17/2024 1053 by Kerwin Peels, RN Outcome: Progressing Goal: Will remain free from infection 01/17/2024 1053 by Kerwin Peels, RN Outcome: Adequate for Discharge 01/17/2024 1053 by Kerwin Peels, RN Outcome: Progressing Goal: Diagnostic test results will improve 01/17/2024 1053 by Kerwin Peels, RN Outcome: Adequate for Discharge 01/17/2024 1053 by Kerwin Peels, RN Outcome: Progressing Goal: Respiratory complications will improve Outcome: Adequate for Discharge Goal: Cardiovascular complication will be avoided Outcome: Adequate for Discharge   Problem: Activity: Goal: Risk for activity intolerance will decrease Outcome: Adequate for Discharge   Problem: Nutrition: Goal: Adequate nutrition will be maintained Outcome: Adequate for Discharge   Problem: Coping: Goal: Level of anxiety will decrease Outcome: Adequate for Discharge   Problem: Elimination: Goal: Will not experience complications related to bowel motility Outcome: Adequate for Discharge Goal: Will not experience complications related to urinary retention Outcome: Adequate for Discharge   Problem: Pain  Managment: Goal: General experience of comfort will improve and/or be controlled Outcome: Adequate for Discharge   Problem: Safety: Goal: Ability to remain free from injury will improve Outcome: Adequate for Discharge   Problem: Skin Integrity: Goal: Risk for impaired skin integrity will decrease Outcome: Adequate for Discharge   Problem: Education: Goal: Ability to describe self-care measures that may prevent or decrease complications (Diabetes Survival Skills Education) will improve Outcome: Adequate for Discharge Goal: Individualized Educational Video(s) Outcome: Adequate for Discharge   Problem: Coping: Goal: Ability to adjust to condition or change in health will improve Outcome: Adequate for Discharge   Problem: Fluid Volume: Goal: Ability to maintain a balanced intake and output will improve Outcome: Adequate for Discharge   Problem: Health Behavior/Discharge Planning: Goal: Ability to identify and utilize available resources and services will improve Outcome: Adequate for Discharge Goal: Ability to manage health-related needs will improve Outcome: Adequate for Discharge   Problem: Metabolic: Goal: Ability to maintain appropriate glucose levels will improve Outcome: Adequate for Discharge   Problem: Nutritional: Goal: Maintenance of adequate nutrition will improve Outcome: Adequate for Discharge Goal: Progress toward achieving an optimal weight will improve Outcome: Adequate for Discharge   Problem: Skin Integrity: Goal: Risk for impaired skin integrity will decrease Outcome: Adequate for Discharge   Problem: Tissue Perfusion: Goal: Adequacy of tissue perfusion will improve Outcome: Adequate for Discharge

## 2024-01-19 LAB — SURGICAL PATHOLOGY

## 2024-01-20 LAB — AEROBIC/ANAEROBIC CULTURE W GRAM STAIN (SURGICAL/DEEP WOUND)

## 2024-02-22 ENCOUNTER — Other Ambulatory Visit: Payer: Self-pay | Admitting: Physician Assistant

## 2024-04-20 ENCOUNTER — Encounter (INDEPENDENT_AMBULATORY_CARE_PROVIDER_SITE_OTHER): Payer: Self-pay | Admitting: *Deleted

## 2024-05-03 ENCOUNTER — Telehealth: Payer: Self-pay | Admitting: *Deleted

## 2024-05-03 ENCOUNTER — Ambulatory Visit (INDEPENDENT_AMBULATORY_CARE_PROVIDER_SITE_OTHER): Admitting: Gastroenterology

## 2024-05-03 ENCOUNTER — Encounter (INDEPENDENT_AMBULATORY_CARE_PROVIDER_SITE_OTHER): Payer: Self-pay | Admitting: Gastroenterology

## 2024-05-03 VITALS — BP 115/74 | HR 62 | Temp 97.5°F | Ht 68.0 in | Wt 241.0 lb

## 2024-05-03 DIAGNOSIS — R194 Change in bowel habit: Secondary | ICD-10-CM

## 2024-05-03 DIAGNOSIS — K5900 Constipation, unspecified: Secondary | ICD-10-CM

## 2024-05-03 DIAGNOSIS — K5904 Chronic idiopathic constipation: Secondary | ICD-10-CM

## 2024-05-03 MED ORDER — NA SULFATE-K SULFATE-MG SULF 17.5-3.13-1.6 GM/177ML PO SOLN: 1.0000 | Freq: Once | ORAL | 0 refills | Status: AC

## 2024-05-03 NOTE — Telephone Encounter (Signed)
 Spoke with pt. Scheduled for TCS with Dr. Eartha on 9/3. Aware will send instructions to his mychart and will send rx for prep to his pharmacy. Confirmed Chartered loss adjuster.

## 2024-05-03 NOTE — Progress Notes (Unsigned)
 Toribio Fortune, M.D. Gastroenterology & Hepatology [facility] Gastroenterology [address] [place], [place] [address] Primary Care Physician: Suanne Pfeiffer, NP [address] [place] [address]  Referring MD: PCP  Chief Complaint: Constipation  History of Present Illness: Craig [name] is a 68 y.o. male with PMH BPH s/p TURBT but now with permanent Foley catheter, OSA, HLD, HTN, , anxiety, gout, who presents for evaluation of constipation.  Patient reports for around 9 years he has had constipation, which has worsened for the last month he has presented recurrent constipation. He reports that he has not been moving too much as he has a catheter in and if he moves around, he will have penile pain. He does not know how often he is having a BM but states he tries to have a BM regularly while taking prune juice. Has tried Metamucil and Miralax  in the past -  Miralax  did not help but believes Metamucil was working. He has been using more prune juice and prunes, as he did not want to use medication. However, he has had to use magnesium citrate intermittently as he has not been able to move his bowels. In fact, prune juice has led to bloating recently.States his stools may vary between soft to flat. Has presented recurrent abdominal discomfort when constipated, which improves with defecation.  He was prescribed Linzess by his PCP, but he did not try to use it as he was concerned about reading the label and it mentioning having a contraindication when there is a bowel obstruction.  The patient denies having any nausea, vomiting, fever, chills, hematochezia, melena, hematemesis, abdominal distention, jaundice, pruritus . Has lost 15 lb since May 2025.  Most recent labs on 01/16/2024 showed a BUN of 10, creatinine 0.66, potassium 4.3, sodium 137, chloride 106, calcium  9.2.  Most recent CT abdomen and pelvis with IV contrast in our system was on 03/27/23,  which showed diverticulosis.  He saw Dr. Aneita in 2017 for constipation, who recommended proceeding with a colonoscopy..  Last ZHI:wzczm Last Colonoscopy:never  FHx: neg for any gastrointestinal/liver disease, no malignancies Social: neg smoking, alcohol or illicit drug use Surgical: TURB  Past Medical History: Past Medical History:  Diagnosis Date   Aneurysm of left common iliac artery (HCC) 04/2021   followed by cardiology---   distal 3cm and prox 2.3,  stable per last CT 04-17-2021  incidental finding   BPH with urinary obstruction    Degenerative arthritis of spine    Diverticulosis of colon    Foley catheter in place    GAD (generalized anxiety disorder)    History of prostatitis    Hyperlipemia    Hypertension    Idiopathic chronic gout    followed by pcp  (previous seen by rheumatologist-- dr everlean);    multiple sites  (07-25-2022  per pt last flare-up 2 wks ago, right foot/ ankle,  stated resolved)   OSA (obstructive sleep apnea)    by dr shellia---  per pt dx yrs ago but did not use cpap, refused   Palpitations    followed by cardiology-- dr wonda;    last event monitor 09-16-2017  SR w/ SB  few PVCs/ PACs   Pre-diabetes    PVC's (premature ventricular contractions)    Tachycardia    January, 2013   Urinary retention    Vitreous floaters of both eyes    Wears glasses     Past Surgical History: Past Surgical  History:  Procedure Laterality Date   IRRIGATION AND DEBRIDEMENT ABSCESS Left 01/15/2024   Procedure: IRRIGATION AND DEBRIDEMENT ABSCESS;  Surgeon: Ann Fine, MD;  Location: [facility];  Service: General;  Laterality: Left;  LEFT AXILLARY ABCESS   SHOULDER SURGERY Right 1978   for recurrent dislocation   TRANSURETHRAL RESECTION OF PROSTATE N/A 08/07/2022   Procedure: TRANSURETHRAL RESECTION OF THE PROSTATE (TURP);  Surgeon: Cam Morene ORN, MD;  [facility];  Service: Urology;  Laterality: N/A;  90 MINUTES NEEDED FOR CASE    WISDOM TOOTH EXTRACTION      Family History: Family History  Problem Relation Age of Onset   Hypertension Mother    Diverticulosis Sister    Diverticulosis Maternal Grandfather     Social History: Social History   Tobacco Use  Smoking Status Former   Types: Cigarettes  Smokeless Tobacco Never  Tobacco Comments   07-25-2022  per pt smoked for 1 yr in junior yr in high school   Social History   Substance and Sexual Activity  Alcohol Use Not Currently   Social History   Substance and Sexual Activity  Drug Use Never    Allergies: Allergies  Allergen Reactions   Metoprolol Anaphylaxis    Pt states cardiac arrest occurred when given. Dr LILLETTE Fetters as the EDP.   Toprol Xl [Metoprolol Succinate] Anaphylaxis    Pt states cardiac arrest occurred when given. Dr LILLETTE Fetters as the EDP.   Sulfamethoxazole -Trimethoprim  Nausea Only    Other Reaction(s): Other (see comments)   Wheat Rash    Was told according to a skin test   Other     Pt states he does not do well with myocins   Azithromycin  Anxiety    Allergy if on long periods at a time  Other Reaction(s): Angioedema   Ciprofloxacin Anxiety    nervous  Pt says he can take levaquin    Zithromax  [Azithromycin  Dihydrate] Anxiety    Allergy if on long periods at a time    Medications: Current Outpatient Medications  Medication Sig Dispense Refill   ALPRAZolam  (XANAX ) 0.5 MG tablet Take 1 tablet by mouth 3 (three) times daily.     amitriptyline  (ELAVIL ) 10 MG tablet Take 10 mg by mouth at bedtime.     atenolol  (TENORMIN ) 25 MG tablet TAKE 1/2 TABLET BY MOUTH ONCE DAILY. 15 tablet 0   colchicine 0.6 MG tablet Take 0.6 mg by mouth daily. As needed for gout flare.     Docusate Calcium  (STOOL SOFTENER PO) Take 1 tablet by mouth 2 (two) times daily as needed (constipation). Per pt currently taking one at night and sometimes one in the morning     silodosin (RAPAFLO) 8 MG CAPS capsule Take 8 mg by mouth daily.     allopurinol   (ZYLOPRIM ) 300 MG tablet Take 150 mg by mouth daily. (Patient not taking: Reported on 05/03/2024)     cholecalciferol (VITAMIN D3) 25 MCG (1000 UNIT) tablet Take 1,000 Units by mouth daily. (Patient not taking: Reported on 05/03/2024)     linaclotide (LINZESS) 145 MCG CAPS capsule Take 145 mcg by mouth daily. (Patient not taking: Reported on 05/03/2024)     methocarbamol  (ROBAXIN ) 500 MG tablet Take 1 tablet (500 mg total) by mouth every 8 (eight) hours as needed for muscle spasms. (Patient not taking: Reported on 05/03/2024) 30 tablet 0   ondansetron  (ZOFRAN ) 4 MG tablet Take 1 tablet (4 mg total) by mouth every 8 (eight) hours as needed for nausea  or vomiting. (Patient not taking: Reported on 05/03/2024) 30 tablet 0   psyllium (METAMUCIL SMOOTH TEXTURE) 58.6 % powder Take 1 packet by mouth 3 (three) times daily. (Patient not taking: Reported on 05/03/2024) 283 g 12   vitamin B-12 (CYANOCOBALAMIN) 100 MCG tablet Take 100 mcg by mouth daily. (Patient not taking: Reported on 05/03/2024)     No current facility-administered medications for this visit.    Review of Systems: GENERAL: negative for malaise, night sweats HEENT: No changes in hearing or vision, no nose bleeds or other nasal problems. NECK: Negative for lumps, goiter, pain and significant neck swelling RESPIRATORY: Negative for cough, wheezing CARDIOVASCULAR: Negative for chest pain, leg swelling, palpitations, orthopnea GI: SEE HPI MUSCULOSKELETAL: Negative for joint pain or swelling, back pain, and muscle pain. SKIN: Negative for lesions, rash PSYCH: Negative for sleep disturbance, mood disorder and recent psychosocial stressors. HEMATOLOGY Negative for prolonged bleeding, bruising easily, and swollen nodes. ENDOCRINE: Negative for cold or heat intolerance, polyuria, polydipsia and goiter. NEURO: negative for tremor, gait imbalance, syncope and seizures. The remainder of the review of systems is noncontributory.   Physical Exam: BP  115/74 (BP Location: Left Arm, Patient Position: Sitting, Cuff Size: Normal)   Pulse 62   Temp (!) 97.5 F (36.4 C) (Oral)   Ht 5' 8 (1.727 m)   Wt 241 lb (109.3 kg)   BMI 36.64 kg/m  GENERAL: The patient is AO x3, in no acute distress. HEENT: Head is normocephalic and atraumatic. EOMI are intact. Mouth is well hydrated and without lesions. NECK: Supple. No masses LUNGS: Clear to auscultation. No presence of rhonchi/wheezing/rales. Adequate chest expansion HEART: RRR, normal s1 and s2. ABDOMEN: Soft, nontender, no guarding, no peritoneal signs, and nondistended. BS +. No masses. Has a Foley acatheter EXTREMITIES: Without any cyanosis, clubbing, rash, lesions or edema. NEUROLOGIC: AOx3, no focal motor deficit. SKIN: no jaundice, no rashes   Imaging/Labs: as above  I personally reviewed and interpreted the available labs, imaging and endoscopic files.  Impression and Plan: Craig [name] is a 68 y.o. male with PMH BPH s/p TURBT but now with permanent Foley catheter, OSA, HLD, HTN, , anxiety, gout, who presents for evaluation of constipation.  Patient has presented recurrent issues with constipation which he has been managing with intermittent use of magnesium citrate and increase intake of prune derivatives.  However, he is still presenting significant symptoms of constipation.  We discussed the possibility of managing his symptoms with increased intake of fiber, but if symptoms persist he will try Linzess instead.  Will check for thyroid abnormalities with blood workup today.  Patient is due for colorectal cancer screening, we will proceed with colonoscopy.  - Check TSH -Start Metamucil fiber supplements daily to increase stool bulk. If feeling bloated, can switch to Benefiber. -If presenting persisting constipation, switch to Linzess. -Schedule colonoscopy  All questions were answered.      Toribio Fortune, MD Gastroenterology and Hepatology [facility]  Gastroenterology

## 2024-05-03 NOTE — Patient Instructions (Addendum)
 Perform blood workup Start Metamucil fiber supplements daily to increase stool bulk. If feeling bloated, can switch to Benefiber. If presenting persisting constipation, switch to Linzess. Schedule colonoscopy

## 2024-05-07 ENCOUNTER — Telehealth: Payer: Self-pay | Admitting: *Deleted

## 2024-05-07 ENCOUNTER — Telehealth (INDEPENDENT_AMBULATORY_CARE_PROVIDER_SITE_OTHER): Payer: Self-pay

## 2024-05-07 NOTE — Telephone Encounter (Signed)
 Discussed with the patient to start taking Linzess, he already has these pills at home. He wants to hold off on colonoscopy for now until his Foley catheter issue is figured out.  I advised him that it was in his best health interest to proceed with colonoscopy when possible.

## 2024-05-07 NOTE — Telephone Encounter (Signed)
 I spoke with the patient He says he was seen on Monday here in the office and did not feel he got to speak with the doctor at length of what he need to tell him. He says Monday after the appointment here he ate and after eating he had some issues with abdominal distention and he felt bloated and uncomfortable, so he says he took some mag citrate it did relieve it.He says this was water  mainly. He has been eating whole grain cereal and thinks this may make his constipation worse. He is taking Metamucil bid, Prune juice 1/2 cup yesterday. He is not taking Linzess because he says he has a history of diverticulitis and he has read up on taking this medication with his history. He has tried eating black cherries, which have helped him in the past. He says the last bm he had was Tuesday or Wednesday and it was nothing but water . He is taking Colace bid, he is not taking Miralax . He did drinks Mag citrate this morning in the hopes of having a bm. Patient has cancelled the upcoming TCS. Please advise.

## 2024-05-07 NOTE — Telephone Encounter (Signed)
 We discussed in length his symptoms during the appointment.  Again, I will recommend taking Linzess.  If not interested, he can try taking instead MiraLAX  1-3 capfuls per day. Thanks

## 2024-05-07 NOTE — Telephone Encounter (Signed)
 SABRA

## 2024-05-07 NOTE — Telephone Encounter (Signed)
 Patient called in. He is wanting to cancel his upcoming procedure 9/3. He just does not feel like he can have this done. Reports he has some ongoing urinary issues along with having constipation. He reports he had to take magnesium citrate to help use the bathroom. He has tried fiber in the past.  He is getting his foley catheter taken out and a new placed in and when he is constipated he has issues with getting the new one in. I transferred him to [place] but advised will go ahead and cancel procedure for now.

## 2024-05-12 ENCOUNTER — Ambulatory Visit (HOSPITAL_COMMUNITY): Admission: RE | Admit: 2024-05-12 | Source: Home / Self Care | Admitting: Gastroenterology

## 2024-05-12 ENCOUNTER — Encounter (HOSPITAL_COMMUNITY): Admission: RE | Payer: Self-pay | Source: Home / Self Care

## 2024-05-12 SURGERY — COLONOSCOPY
Anesthesia: Choice

## 2024-05-15 ENCOUNTER — Other Ambulatory Visit: Payer: Self-pay

## 2024-05-15 ENCOUNTER — Emergency Department (HOSPITAL_COMMUNITY)

## 2024-05-15 ENCOUNTER — Emergency Department (HOSPITAL_COMMUNITY)
Admission: EM | Admit: 2024-05-15 | Discharge: 2024-05-15 | Disposition: A | Discharge status: Home / Self Care | Attending: Emergency Medicine | Admitting: Emergency Medicine

## 2024-05-15 DIAGNOSIS — R109 Unspecified abdominal pain: Secondary | ICD-10-CM | POA: Diagnosis not present

## 2024-05-15 DIAGNOSIS — K5900 Constipation, unspecified: Secondary | ICD-10-CM | POA: Insufficient documentation

## 2024-05-15 LAB — CBC WITH DIFFERENTIAL/PLATELET
Abs Immature Granulocytes: 0.01 K/uL (ref 0.00–0.07)
Basophils Absolute: 0.1 K/uL (ref 0.0–0.1)
Basophils Relative: 1 %
Eosinophils Absolute: 0.2 K/uL (ref 0.0–0.5)
Eosinophils Relative: 4 %
HCT: 44.3 % (ref 39.0–52.0)
Hemoglobin: 16 g/dL (ref 13.0–17.0)
Immature Granulocytes: 0 %
Lymphocytes Relative: 27 %
Lymphs Abs: 1.5 K/uL (ref 0.7–4.0)
MCH: 33.3 pg (ref 26.0–34.0)
MCHC: 36.1 g/dL — ABNORMAL HIGH (ref 30.0–36.0)
MCV: 92.3 fL (ref 80.0–100.0)
Monocytes Absolute: 0.5 K/uL (ref 0.1–1.0)
Monocytes Relative: 8 %
Neutro Abs: 3.5 K/uL (ref 1.7–7.7)
Neutrophils Relative %: 60 %
Platelets: 226 K/uL (ref 150–400)
RBC: 4.8 MIL/uL (ref 4.22–5.81)
RDW: 12.6 % (ref 11.5–15.5)
WBC: 5.8 K/uL (ref 4.0–10.5)
nRBC: 0 % (ref 0.0–0.2)

## 2024-05-15 LAB — COMPREHENSIVE METABOLIC PANEL WITH GFR
ALT: 18 U/L (ref 0–44)
AST: 22 U/L (ref 15–41)
Albumin: 3.8 g/dL (ref 3.5–5.0)
Alkaline Phosphatase: 92 U/L (ref 38–126)
Anion gap: 11 (ref 5–15)
BUN: 10 mg/dL (ref 8–23)
CO2: 24 mmol/L (ref 22–32)
Calcium: 9.5 mg/dL (ref 8.9–10.3)
Chloride: 105 mmol/L (ref 98–111)
Creatinine, Ser: 0.74 mg/dL (ref 0.61–1.24)
GFR, Estimated: >60 mL/min (ref >60)
Glucose, Bld: 97 mg/dL (ref 70–99)
Potassium: 3.9 mmol/L (ref 3.5–5.1)
Sodium: 140 mmol/L (ref 135–145)
Total Bilirubin: 1.3 mg/dL — ABNORMAL HIGH (ref 0.0–1.2)
Total Protein: 7.2 g/dL (ref 6.5–8.1)

## 2024-05-15 LAB — LIPASE, BLOOD: Lipase: 34 U/L (ref 11–51)

## 2024-05-15 MED ORDER — IOHEXOL 300 MG/ML  SOLN
100.0000 mL | Freq: Once | INTRAMUSCULAR | Status: AC | PRN
  Administered 2024-05-15: 100 mL via INTRAVENOUS

## 2024-05-15 NOTE — Discharge Instructions (Signed)
 Start taking your linzess, follow-up with the gastroenterologist

## 2024-05-15 NOTE — ED Triage Notes (Signed)
 Pt c/o constipation for about a month and OTC meds have not helped very much. Pt states he hasn't had a formed stool in about 10 days , just some loose stool. Pt lower abd hurts and he states his rectum has pressure when he sits down.

## 2024-05-15 NOTE — ED Provider Notes (Signed)
 [place] EMERGENCY DEPARTMENT AT [facility] Provider Note   CSN: [id number] Arrival date & time: 05/15/24  9240     Patient presents with: Constipation   Craig [name] is a 68 y.o. male.   Patient complains of constipation.  He has been taking magnesium citrate  The history is provided by the patient and medical records. No language interpreter was used.  Constipation Severity:  Moderate Timing:  Intermittent Progression:  Waxing and waning Chronicity:  Recurrent Context: not dehydration   Stool description:  Watery Relieved by:  Nothing Worsened by:  Nothing Ineffective treatments:  None tried Associated symptoms: abdominal pain   Associated symptoms: no back pain and no diarrhea        Prior to Admission medications   Medication Sig Start Date End Date Taking? Authorizing Provider  allopurinol  (ZYLOPRIM ) 300 MG tablet Take 150 mg by mouth daily. Patient not taking: Reported on 05/03/2024 07/30/22   [provider]  ALPRAZolam  (XANAX ) 0.5 MG tablet Take 1 tablet by mouth 3 (three) times daily. 10/27/23   [provider]  amitriptyline  (ELAVIL ) 10 MG tablet Take 10 mg by mouth at bedtime.    [provider]  atenolol  (TENORMIN ) 25 MG tablet TAKE 1/2 TABLET BY MOUTH ONCE DAILY. 02/23/24   Lelon Hamilton T, PA-C  cholecalciferol (VITAMIN D3) 25 MCG (1000 UNIT) tablet Take 1,000 Units by mouth daily. Patient not taking: Reported on 05/03/2024    [provider]  colchicine 0.6 MG tablet Take 0.6 mg by mouth daily. As needed for gout flare.    [provider]  Docusate Calcium  (STOOL SOFTENER PO) Take 1 tablet by mouth 2 (two) times daily as needed (constipation). Per pt currently taking one at night and sometimes one in the morning    [provider]  linaclotide (LINZESS) 145 MCG CAPS capsule Take 145 mcg by mouth daily. Patient not taking: Reported on 05/03/2024 12/29/23   [provider]  methocarbamol   (ROBAXIN ) 500 MG tablet Take 1 tablet (500 mg total) by mouth every 8 (eight) hours as needed for muscle spasms. Patient not taking: Reported on 05/03/2024 01/17/24   Rai, Nydia POUR, MD  ondansetron  (ZOFRAN ) 4 MG tablet Take 1 tablet (4 mg total) by mouth every 8 (eight) hours as needed for nausea or vomiting. Patient not taking: Reported on 05/03/2024 01/17/24 01/16/25  Rai, Nydia POUR, MD  psyllium (METAMUCIL SMOOTH TEXTURE) 58.6 % powder Take 1 packet by mouth 3 (three) times daily. Patient not taking: Reported on 05/03/2024 03/27/23   Nivia Colon, PA-C  silodosin (RAPAFLO) 8 MG CAPS capsule Take 8 mg by mouth daily. 09/02/23   [provider]  vitamin B-12 (CYANOCOBALAMIN) 100 MCG tablet Take 100 mcg by mouth daily. Patient not taking: Reported on 05/03/2024    [provider]    Allergies: Metoprolol, Toprol xl [metoprolol succinate], Sulfamethoxazole -trimethoprim , Wheat, Other, Azithromycin , Ciprofloxacin, and Zithromax  [azithromycin  dihydrate]    Review of Systems  Constitutional:  Negative for appetite change and fatigue.  HENT:  Negative for congestion, ear discharge and sinus pressure.   Eyes:  Negative for discharge.  Respiratory:  Negative for cough.   Cardiovascular:  Negative for chest pain.  Gastrointestinal:  Positive for abdominal pain and constipation. Negative for diarrhea.  Genitourinary:  Negative for frequency and hematuria.  Musculoskeletal:  Negative for back pain.  Skin:  Negative for rash.  Neurological:  Negative for seizures and headaches.  Psychiatric/Behavioral:  Negative for hallucinations.  Updated Vital Signs BP 109/68   Pulse 62   Temp (!) 97.5 F (36.4 C) (Oral)   Resp 16   Ht 5' 8 (1.727 m)   Wt 108.9 kg   SpO2 97%   BMI 36.49 kg/m   Physical Exam Vitals and nursing note reviewed.  Constitutional:      Appearance: He is well-developed.  HENT:     Head: Normocephalic.     Nose: Nose normal.  Eyes:     General: No scleral  icterus.    Conjunctiva/sclera: Conjunctivae normal.  Neck:     Thyroid: No thyromegaly.  Cardiovascular:     Rate and Rhythm: Normal rate and regular rhythm.     Heart sounds: No murmur heard.    No friction rub. No gallop.  Pulmonary:     Breath sounds: No stridor. No wheezing or rales.  Chest:     Chest wall: No tenderness.  Abdominal:     General: There is no distension.     Tenderness: There is no abdominal tenderness. There is no rebound.  Musculoskeletal:        General: Normal range of motion.     Cervical back: Neck supple.  Lymphadenopathy:     Cervical: No cervical adenopathy.  Skin:    Findings: No erythema or rash.  Neurological:     Mental Status: He is alert and oriented to person, place, and time.     Motor: No abnormal muscle tone.     Coordination: Coordination normal.  Psychiatric:        Behavior: Behavior normal.     (all labs ordered are listed, but only abnormal results are displayed) Labs Reviewed  CBC WITH DIFFERENTIAL/PLATELET - Abnormal; Notable for the following components:      Result Value   MCHC 36.1 (*)    All other components within normal limits  COMPREHENSIVE METABOLIC PANEL WITH GFR - Abnormal; Notable for the following components:   Total Bilirubin 1.3 (*)    All other components within normal limits  LIPASE, BLOOD    EKG: None  Radiology: CT ABDOMEN PELVIS W CONTRAST Result Date: 05/15/2024 CLINICAL DATA:  Constipation and abdominal pain. EXAM: CT ABDOMEN AND PELVIS WITH CONTRAST TECHNIQUE: Multidetector CT imaging of the abdomen and pelvis was performed using the standard protocol following bolus administration of intravenous contrast. RADIATION DOSE REDUCTION: This exam was performed according to the departmental dose-optimization program which includes automated exposure control, adjustment of the mA and/or kV according to patient size and/or use of iterative reconstruction technique. CONTRAST:  OMNIPAQUE  IOHEXOL  300 MG/ML   SOLN COMPARISON:  March 27, 2023 FINDINGS: Lower chest: No acute abnormality. Hepatobiliary: There is diffuse fatty infiltration of the liver parenchyma. No focal liver abnormality is seen. Ill-defined gallstones versus gallbladder sludge is seen within the gallbladder lumen without evidence of gallbladder wall thickening or biliary dilatation. Pancreas: Unremarkable. No pancreatic ductal dilatation or surrounding inflammatory changes. Spleen: Normal in size without focal abnormality. Adrenals/Urinary Tract: Adrenal glands are unremarkable. Kidneys are normal, without renal calculi, focal lesion, or hydronephrosis. A Foley catheter is seen within an empty urinary bladder. Stomach/Bowel: Stomach is within normal limits. Appendix appears normal. Stool is seen throughout the large bowel. No evidence of bowel wall thickening, distention, or inflammatory changes. Noninflamed diverticula are seen throughout the proximal to mid sigmoid colon. Vascular/Lymphatic: Aortic atherosclerosis. 3.4 cm diameter aneurysmal dilatation of the distal left common iliac artery is seen (axial CT image 68, CT series 2). No enlarged abdominal  or pelvic lymph nodes. Reproductive: Prostate gland is surgically absent. Other: A 2.5 cm x 2.6 cm x 2.5 cm fat containing umbilical hernia is noted. No abdominopelvic ascites. Musculoskeletal: Multilevel degenerative changes are seen throughout the lumbar spine. IMPRESSION: 1. Hepatic steatosis. 2. Cholelithiasis versus gallbladder sludge. 3. Sigmoid diverticulosis. 4. 3.4 cm diameter aneurysmal dilatation of the distal left common iliac artery. 5. Fat-containing umbilical hernia. 6. Aortic atherosclerosis. Electronically Signed   By: Suzen Dials M.D.   On: 05/15/2024 10:44     Procedures   Medications Ordered in the ED  iohexol  (OMNIPAQUE ) 300 MG/ML solution 100 mL (100 mLs Intravenous Contrast Given 05/15/24 1029)                                    Medical Decision Making Amount  and/or Complexity of Data Reviewed Labs: ordered. Radiology: ordered.  Risk Prescription drug management.   Will consult patient and is relieved by magnesium citrate.  He will start back on his Linzess and follow-up with GI     Final diagnoses:  Constipation, unspecified constipation type    ED Discharge Orders     None          Suzette Pac, MD 05/15/24 1742

## 2024-05-24 ENCOUNTER — Ambulatory Visit (INDEPENDENT_AMBULATORY_CARE_PROVIDER_SITE_OTHER): Admitting: Gastroenterology

## 2024-05-24 ENCOUNTER — Encounter (INDEPENDENT_AMBULATORY_CARE_PROVIDER_SITE_OTHER): Payer: Self-pay | Admitting: Gastroenterology

## 2024-05-24 VITALS — BP 97/63 | HR 65 | Temp 97.3°F | Ht 68.0 in | Wt 229.5 lb

## 2024-05-24 DIAGNOSIS — R194 Change in bowel habit: Secondary | ICD-10-CM

## 2024-05-24 DIAGNOSIS — K5904 Chronic idiopathic constipation: Secondary | ICD-10-CM

## 2024-05-24 DIAGNOSIS — K5900 Constipation, unspecified: Secondary | ICD-10-CM

## 2024-05-24 DIAGNOSIS — K760 Fatty (change of) liver, not elsewhere classified: Secondary | ICD-10-CM | POA: Diagnosis not present

## 2024-05-24 NOTE — Progress Notes (Signed)
 Toribio Fortune, M.D. Gastroenterology & Hepatology [facility] Gastroenterology [address] [place], [place] [address]  Primary Care Physician: Suanne Pfeiffer, NP [address] [place] [address]  I will communicate my assessment and recommendations to the referring MD via EMR.  Problems: Constipation  History of Present Illness: Craig [name] is a 68 y.o. male with PMH BPH s/p TURBT but now with permanent Foley catheter, OSA, HLD, HTN, , anxiety, gout, who presents for follow-up of constipation.   The patient was last seen on 05/03/2024. At that time, the patient had TSH ordered but he did not perform this patient was scheduled for colonoscopy but he canceled this as he wanted to have urological issues fix first.  As he was not interested in taking medications, he was advised to start taking Metamucil but as he did not improve I advised to start MiraLAX  regularly.  I even insisted him to take Linzess (patient called to our office), but he never started this as he read about the medication and was concerned.    Patient has been mostly taking Colace twice daily.  Patient went to the ER again on 05/15/2024 after presenting persistent constipation despite taking magnesium citrate at home.  He had blood workup performed which showed a normal CMP, CBC and lipase.  A CT abdomen and pelvis with IV contrast was performed which showed hepatic steatosis, cholelithiasis, diverticulosis and a 3.4 cm dilation of the distal left common iliac artery.  There was presence of umbilical hernia.  Patient states that he is still constipated and takes magnesium citrate intermittently to have a liquid movement. He is moving his bowels only if he takes magnesium citrate. He is having significant bloating due to constipation, although not having abdominal pain.  The patient denies having any nausea, vomiting, fever, chills, hematochezia, melena, hematemesis, diarrhea,  jaundice, pruritus.  Last ZHI:wzczm Last Colonoscopy:never  Past Medical History: Past Medical History:  Diagnosis Date   Aneurysm of left common iliac artery (HCC) 04/2021   followed by cardiology---   distal 3cm and prox 2.3,  stable per last CT 04-17-2021  incidental finding   BPH with urinary obstruction    Degenerative arthritis of spine    Diverticulosis of colon    Foley catheter in place    GAD (generalized anxiety disorder)    History of prostatitis    Hyperlipemia    Hypertension    Idiopathic chronic gout    followed by pcp  (previous seen by rheumatologist-- dr everlean);    multiple sites  (07-25-2022  per pt last flare-up 2 wks ago, right foot/ ankle,  stated resolved)   OSA (obstructive sleep apnea)    by dr shellia---  per pt dx yrs ago but did not use cpap, refused   Palpitations    followed by cardiology-- dr wonda;    last event monitor 09-16-2017  SR w/ SB  few PVCs/ PACs   Pre-diabetes    PVC's (premature ventricular contractions)    Tachycardia    January, 2013   Urinary retention    Vitreous floaters of both eyes    Wears glasses     Past Surgical History: Past Surgical History:  Procedure Laterality Date   IRRIGATION AND DEBRIDEMENT ABSCESS Left 01/15/2024   Procedure: IRRIGATION AND DEBRIDEMENT ABSCESS;  Surgeon: Ann Fine, MD;  Location: [facility];  Service: General;  Laterality: Left;  LEFT AXILLARY ABCESS   SHOULDER SURGERY Right 1978  for recurrent dislocation   TRANSURETHRAL RESECTION OF PROSTATE N/A 08/07/2022   Procedure: TRANSURETHRAL RESECTION OF THE PROSTATE (TURP);  Surgeon: Cam Morene ORN, MD;  Location[facility];  Service: Urology;  Laterality: N/A;  90 MINUTES NEEDED FOR CASE   WISDOM TOOTH EXTRACTION      Family History: Family History  Problem Relation Age of Onset   Hypertension Mother    Diverticulosis Sister    Diverticulosis Maternal Grandfather     Social History: Social History   Tobacco Use   Smoking Status Former   Types: Cigarettes  Smokeless Tobacco Never  Tobacco Comments   07-25-2022  per pt smoked for 1 yr in junior yr in high school   Social History   Substance and Sexual Activity  Alcohol Use Not Currently   Social History   Substance and Sexual Activity  Drug Use Never    Allergies: Allergies  Allergen Reactions   Metoprolol Anaphylaxis    Pt states cardiac arrest occurred when given. Dr LILLETTE Fetters as the EDP.   Toprol Xl [Metoprolol Succinate] Anaphylaxis    Pt states cardiac arrest occurred when given. Dr LILLETTE Fetters as the EDP.   Sulfamethoxazole -Trimethoprim  Nausea Only    Other Reaction(s): Other (see comments)   Wheat Rash    Was told according to a skin test   Other     Pt states he does not do well with myocins   Azithromycin  Anxiety    Allergy if on long periods at a time  Other Reaction(s): Angioedema   Ciprofloxacin Anxiety    nervous  Pt says he can take levaquin    Zithromax  [Azithromycin  Dihydrate] Anxiety    Allergy if on long periods at a time    Medications: Current Outpatient Medications  Medication Sig Dispense Refill   ALPRAZolam  (XANAX ) 0.5 MG tablet Take 1 tablet by mouth 3 (three) times daily.     amitriptyline  (ELAVIL ) 10 MG tablet Take 10 mg by mouth at bedtime.     atenolol  (TENORMIN ) 25 MG tablet TAKE 1/2 TABLET BY MOUTH ONCE DAILY. 15 tablet 0   colchicine 0.6 MG tablet Take 0.6 mg by mouth daily. As needed for gout flare.     Docusate Calcium  (STOOL SOFTENER PO) Take 1 tablet by mouth 2 (two) times daily as needed (constipation). Per pt currently taking one at night and sometimes one in the morning     psyllium (METAMUCIL SMOOTH TEXTURE) 58.6 % powder Take 1 packet by mouth 3 (three) times daily. (Patient taking differently: Take 1 packet by mouth as needed.) 283 g 12   silodosin (RAPAFLO) 8 MG CAPS capsule Take 8 mg by mouth daily.     allopurinol  (ZYLOPRIM ) 300 MG tablet Take 150 mg by mouth daily. (Patient not taking:  Reported on 05/03/2024)     cholecalciferol (VITAMIN D3) 25 MCG (1000 UNIT) tablet Take 1,000 Units by mouth daily. (Patient not taking: Reported on 05/24/2024)     linaclotide (LINZESS) 145 MCG CAPS capsule Take 145 mcg by mouth daily. (Patient not taking: Reported on 05/24/2024)     vitamin B-12 (CYANOCOBALAMIN) 100 MCG tablet Take 100 mcg by mouth daily. (Patient not taking: Reported on 05/24/2024)     No current facility-administered medications for this visit.    Review of Systems: GENERAL: negative for malaise, night sweats HEENT: No changes in hearing or vision, no nose bleeds or other nasal problems. NECK: Negative for lumps, goiter, pain and significant neck swelling RESPIRATORY: Negative for cough, wheezing  CARDIOVASCULAR: Negative for chest pain, leg swelling, palpitations, orthopnea GI: SEE HPI MUSCULOSKELETAL: Negative for joint pain or swelling, back pain, and muscle pain. SKIN: Negative for lesions, rash PSYCH: Negative for sleep disturbance, mood disorder and recent psychosocial stressors. HEMATOLOGY Negative for prolonged bleeding, bruising easily, and swollen nodes. ENDOCRINE: Negative for cold or heat intolerance, polyuria, polydipsia and goiter. NEURO: negative for tremor, gait imbalance, syncope and seizures. The remainder of the review of systems is noncontributory.   Physical Exam: BP 97/63 (BP Location: Left Arm, Patient Position: Sitting, Cuff Size: Large)   Pulse 65   Temp (!) 97.3 F (36.3 C) (Temporal)   Ht 5' 8 (1.727 m)   Wt 229 lb 8 oz (104.1 kg)   BMI 34.90 kg/m  GENERAL: The patient is AO x3, in no acute distress. HEENT: Head is normocephalic and atraumatic. EOMI are intact. Mouth is well hydrated and without lesions. NECK: Supple. No masses LUNGS: Clear to auscultation. No presence of rhonchi/wheezing/rales. Adequate chest expansion HEART: RRR, normal s1 and s2. ABDOMEN: Soft, nontender, no guarding, no peritoneal signs, and nondistended. BS +. No  masses. EXTREMITIES: Without any cyanosis, clubbing, rash, lesions or edema. NEUROLOGIC: AOx3, no focal motor deficit. SKIN: no jaundice, no rashes  Imaging/Labs: as above  I personally reviewed and interpreted the available labs, imaging and endoscopic files.  Impression and Plan: JADARIAN MCKAY is a 67 y.o. male with PMH BPH s/p TURBT but now with permanent Foley catheter, OSA, HLD, HTN, , anxiety, gout, who presents for follow-up of constipation.  Patient has presented persistent symptoms of constipation.  Unfortunately, he has not followed the previous discussions held in his previous appointment as he was afraid of presenting a complication with Linzess.  He has had 2 cross-sectional abdominal scans that were negative for obstruction or signs concerning for any lesions that could lead to a potential bowel rupture.  I encouraged the patient to start taking Linzess on a daily basis as previously advised.  Again, we have discussed about the importance of performing blood testing to rule out metabolic etiologies, I will reorder TSH which he did not perform in the past.  It is very possible he is presenting chronic idiopathic constipation exacerbated by poor ambulation and urinary catheterization.  I discussed again the importance of proceeding with colonoscopy to evaluate any endoluminal etiologies worsening his constipation.  Patient is not interested in pursuing this until his bowel movements have improved.  He will let me know when he wants to proceed with this.  During his most recent scan he was found to have fatty liver.  However his liver enzymes have been normal which is reassuring.  I encouraged him to eat healthier diet and exercise more often if possible.  -Start Linzess 145 mcg every day -Perform blood workup -Try to eat healthier diet (can read about Mediterranean diet) and exercise more often - Patient will reach me back regarding when ready to proceed with colonoscopy  All  questions were answered.      Toribio Fortune, MD Gastroenterology and Hepatology [facility] Gastroenterology

## 2024-05-24 NOTE — Patient Instructions (Addendum)
 Start Linzess 145 mcg every day Perform blood workup Try to eat healthier diet (can read about Mediterranean diet) and exercise more often

## 2024-05-26 ENCOUNTER — Ambulatory Visit (INDEPENDENT_AMBULATORY_CARE_PROVIDER_SITE_OTHER): Payer: Self-pay | Admitting: Gastroenterology

## 2024-05-26 LAB — TSH: TSH: 1.49 u[IU]/mL (ref 0.450–4.500)

## 2024-06-07 ENCOUNTER — Telehealth: Payer: Self-pay | Admitting: Gastroenterology

## 2024-06-07 ENCOUNTER — Telehealth: Payer: Self-pay | Admitting: Internal Medicine

## 2024-06-07 NOTE — Telephone Encounter (Signed)
 Good Morning Dr. Albertus,   I have a referral from this patient  requesting to be seen for his fatty liver. Patient stated that he is with Gastroenterology and Hepatology but his doctor is leaving the practice and does not feel like his care will be handle appropriately. Patient did requesting to schedule an appointment with you. Patient records can be view in EPIC. Would you please advise on how to schedule this patient.    Thank you.

## 2024-06-07 NOTE — Telephone Encounter (Signed)
 error

## 2024-06-08 NOTE — Telephone Encounter (Signed)
 I am aware Dr. Eartha is leaving.  The [facility] remains strong and I have confidence in that group. I would encourage him to remain at that practice due to limited availability at [facility] at present. If the transition to a new provider does not work for him he can reach out to us  afterwards with more input at that time

## 2024-06-23 ENCOUNTER — Encounter (INDEPENDENT_AMBULATORY_CARE_PROVIDER_SITE_OTHER): Payer: Self-pay | Admitting: Gastroenterology

## 2024-06-25 NOTE — Telephone Encounter (Addendum)
 Good afternoon Dr Albertus   After speaking with patient and advising him of previous recommendations patient states he does not wish to continue care with previous provider and feels that you are the best provider for his care.   Could you please advise. Thank you

## 2024-06-28 NOTE — Telephone Encounter (Signed)
 Okay for appointment, nonurgent.  Patient may have to wait multiple months to be seen as I currently do not have an opening

## 2024-06-29 ENCOUNTER — Encounter: Payer: Self-pay | Admitting: Physician Assistant

## 2024-07-28 ENCOUNTER — Other Ambulatory Visit (HOSPITAL_COMMUNITY): Payer: Self-pay | Admitting: Surgery

## 2024-07-28 DIAGNOSIS — K828 Other specified diseases of gallbladder: Secondary | ICD-10-CM

## 2024-07-29 ENCOUNTER — Other Ambulatory Visit (HOSPITAL_COMMUNITY): Payer: Self-pay | Admitting: Surgery

## 2024-07-29 DIAGNOSIS — K828 Other specified diseases of gallbladder: Secondary | ICD-10-CM

## 2024-08-03 ENCOUNTER — Ambulatory Visit (INDEPENDENT_AMBULATORY_CARE_PROVIDER_SITE_OTHER): Admitting: Gastroenterology

## 2024-08-11 ENCOUNTER — Ambulatory Visit (HOSPITAL_COMMUNITY)
Admission: RE | Admit: 2024-08-11 | Discharge: 2024-08-11 | Disposition: A | Source: Ambulatory Visit | Attending: Surgery | Admitting: Surgery

## 2024-08-11 DIAGNOSIS — K828 Other specified diseases of gallbladder: Secondary | ICD-10-CM | POA: Insufficient documentation

## 2024-08-16 ENCOUNTER — Other Ambulatory Visit (HOSPITAL_COMMUNITY)

## 2024-08-17 ENCOUNTER — Ambulatory Visit: Admitting: Physician Assistant

## 2024-08-25 ENCOUNTER — Ambulatory Visit: Payer: Self-pay | Admitting: Surgery

## 2024-08-25 NOTE — Progress Notes (Signed)
 USN shows gallstones are present.  Await nuclear med HIDA scan results.  Krystal Spinner, MD [facility] A [facility] practice Office: [phone]
